# Patient Record
Sex: Female | Born: 1965
Health system: Southern US, Community
[De-identification: ages and names within clinical notes are randomized; demographics above are authoritative.]

## PROBLEM LIST (undated history)

## (undated) DIAGNOSIS — I10 Essential (primary) hypertension: Secondary | ICD-10-CM

## (undated) DIAGNOSIS — F419 Anxiety disorder, unspecified: Secondary | ICD-10-CM

## (undated) DIAGNOSIS — T7840XA Allergy, unspecified, initial encounter: Secondary | ICD-10-CM

## (undated) DIAGNOSIS — E669 Obesity, unspecified: Secondary | ICD-10-CM

## (undated) DIAGNOSIS — D649 Anemia, unspecified: Secondary | ICD-10-CM

## (undated) DIAGNOSIS — K219 Gastro-esophageal reflux disease without esophagitis: Secondary | ICD-10-CM

## (undated) HISTORY — PX: CARPAL TUNNEL RELEASE: SHX101

## (undated) HISTORY — DX: Allergy, unspecified, initial encounter: T78.40XA

## (undated) HISTORY — PX: ABDOMINAL HYSTERECTOMY: SHX81

## (undated) HISTORY — PX: BREAST REDUCTION SURGERY: SHX8

## (undated) HISTORY — DX: Gastro-esophageal reflux disease without esophagitis: K21.9

## (undated) HISTORY — PX: WISDOM TOOTH EXTRACTION: SHX21

## (undated) HISTORY — PX: LUMBAR MICRODISCECTOMY: SHX99

## (undated) HISTORY — DX: Anxiety disorder, unspecified: F41.9

## (undated) HISTORY — PX: LAPAROSCOPY: SHX197

## (undated) HISTORY — PX: TYMPANOSTOMY TUBE PLACEMENT: SHX32

## (undated) HISTORY — DX: Obesity, unspecified: E66.9

## (undated) HISTORY — DX: Anemia, unspecified: D64.9

## (undated) HISTORY — PX: CERVICAL DISCECTOMY: SHX98

---

## 1999-12-15 HISTORY — PX: REDUCTION MAMMAPLASTY: SUR839

## 2001-05-24 ENCOUNTER — Encounter (INDEPENDENT_AMBULATORY_CARE_PROVIDER_SITE_OTHER): Payer: Self-pay | Admitting: *Deleted

## 2001-05-24 ENCOUNTER — Observation Stay (HOSPITAL_COMMUNITY): Admission: RE | Admit: 2001-05-24 | Discharge: 2001-05-25 | Payer: Self-pay | Admitting: Obstetrics and Gynecology

## 2001-11-07 ENCOUNTER — Ambulatory Visit (HOSPITAL_COMMUNITY): Admission: RE | Admit: 2001-11-07 | Discharge: 2001-11-07 | Payer: Self-pay | Admitting: Family Medicine

## 2001-11-07 ENCOUNTER — Encounter: Payer: Self-pay | Admitting: Family Medicine

## 2003-06-15 ENCOUNTER — Other Ambulatory Visit: Admission: RE | Admit: 2003-06-15 | Discharge: 2003-06-15 | Payer: Self-pay | Admitting: Obstetrics and Gynecology

## 2003-09-25 ENCOUNTER — Encounter: Admission: RE | Admit: 2003-09-25 | Discharge: 2003-09-25 | Payer: Self-pay | Admitting: Family Medicine

## 2003-09-25 ENCOUNTER — Encounter: Payer: Self-pay | Admitting: Family Medicine

## 2003-10-29 ENCOUNTER — Ambulatory Visit (HOSPITAL_COMMUNITY): Admission: RE | Admit: 2003-10-29 | Discharge: 2003-10-29 | Payer: Self-pay | Admitting: Family Medicine

## 2004-06-17 ENCOUNTER — Other Ambulatory Visit: Admission: RE | Admit: 2004-06-17 | Discharge: 2004-06-17 | Payer: Self-pay | Admitting: Obstetrics and Gynecology

## 2005-07-27 ENCOUNTER — Other Ambulatory Visit: Admission: RE | Admit: 2005-07-27 | Discharge: 2005-07-27 | Payer: Self-pay | Admitting: Obstetrics and Gynecology

## 2006-04-04 ENCOUNTER — Emergency Department (HOSPITAL_COMMUNITY): Admission: EM | Admit: 2006-04-04 | Discharge: 2006-04-04 | Payer: Self-pay | Admitting: Family Medicine

## 2006-11-08 ENCOUNTER — Ambulatory Visit (HOSPITAL_BASED_OUTPATIENT_CLINIC_OR_DEPARTMENT_OTHER): Admission: RE | Admit: 2006-11-08 | Discharge: 2006-11-08 | Payer: Self-pay | Admitting: Psychiatry

## 2006-11-14 ENCOUNTER — Ambulatory Visit: Payer: Self-pay | Admitting: Internal Medicine

## 2008-02-13 ENCOUNTER — Emergency Department (HOSPITAL_COMMUNITY): Admission: EM | Admit: 2008-02-13 | Discharge: 2008-02-13 | Payer: Self-pay | Admitting: Family Medicine

## 2009-05-27 ENCOUNTER — Emergency Department (HOSPITAL_COMMUNITY): Admission: EM | Admit: 2009-05-27 | Discharge: 2009-05-27 | Payer: Self-pay | Admitting: Family Medicine

## 2009-10-02 ENCOUNTER — Encounter: Admission: RE | Admit: 2009-10-02 | Discharge: 2009-10-02 | Payer: Self-pay | Admitting: Family Medicine

## 2009-10-22 ENCOUNTER — Encounter: Admission: RE | Admit: 2009-10-22 | Discharge: 2009-10-22 | Payer: Self-pay | Admitting: Family Medicine

## 2009-11-01 ENCOUNTER — Ambulatory Visit (HOSPITAL_COMMUNITY): Admission: RE | Admit: 2009-11-01 | Discharge: 2009-11-01 | Payer: Self-pay | Admitting: Gastroenterology

## 2009-12-09 ENCOUNTER — Emergency Department (HOSPITAL_COMMUNITY): Admission: EM | Admit: 2009-12-09 | Discharge: 2009-12-09 | Payer: Self-pay | Admitting: Family Medicine

## 2010-12-26 ENCOUNTER — Ambulatory Visit (HOSPITAL_COMMUNITY)
Admission: RE | Admit: 2010-12-26 | Discharge: 2010-12-26 | Payer: Self-pay | Source: Home / Self Care | Attending: Family Medicine | Admitting: Family Medicine

## 2011-01-03 ENCOUNTER — Encounter: Payer: Self-pay | Admitting: Family Medicine

## 2011-05-01 NOTE — Discharge Summary (Signed)
[facility] [place]  Patient:    Theresa Henry, Theresa Henry                      MRN: [id number] Adm. Date:  [id number] Disc. Date: [id number] Attending:  Cordelia Pen Ii                           Discharge Summary  ADMITTING DIAGNOSES:          Endometriosis and menorrhagia.  DISCHARGE DIAGNOSES:          Endometriosis and menorrhagia.  PROCEDURE:                    Laparoscopically assisted vaginal hysterectomy with bilateral salpingo-oophorectomy.  REASON FOR ADMISSION:         This patient is a 45 year old married white female with known endometriosis who requests definitive management.  Details are dictated in the history and physical.  HOSPITAL COURSE:              The patient is admitted to the hospital, undergoes the above procedure without complication.  Estimated blood loss is less than 100 cc.  On the evening of surgery she has good pain relief.  Vital signs are stable with good urine output and she is afebrile.  On the day of discharge she is passing flatus, tolerating regular diet, ambulating and voiding well.  Vital signs are stable.  She is afebrile.  On the first postoperative day hemoglobin is 10.6, white count is 11.4.  CONDITION ON DISCHARGE:       Good.  DIET:                         Regular, as tolerated.  ACTIVITY:                     No lifting, no operation of automobiles, no vaginal entry.  DISCHARGE INSTRUCTIONS:       She is to call the office for problems including, but not limited to, temperature over 100 degrees, increasing pain, persistent nausea or vomiting, heavy vaginal bleeding.  DISCHARGE MEDICATIONS:        1. Tylox #30 one to two p.o. q.6h. p.r.n.                               2. Ibuprofen 600 mg p.o. q.6h. p.r.n.                               3. Colace one p.o. q.d. p.r.n.                               4. Estrogen patch Vivelle dot 0.1 mg every three                                  and a half days.  FOLLOW-UP:                     Return to office in two weeks. DD:  05/25/01 TD:  05/25/01 Job: [id number]

## 2011-05-01 NOTE — Procedures (Signed)
NAME:  Theresa Henry, Theresa Henry               ACCOUNT NO.:  [id number]   MEDICAL RECORD NO.:  [id number]          PATIENT TYPE:  OUT   LOCATION:  SLEEP CENTER                 FACILITY:  [facility]   PHYSICIAN:  Clinton D. Maple Hudson, MD, FCCP, FACPDATE OF BIRTH:  September 30[birth year] 1965   DATE OF STUDY:  11/08/2006                            NOCTURNAL POLYSOMNOGRAM   REFERRING PHYSICIAN:  Anne Fu, M.D.   INDICATIONS FOR STUDY:  Obstructive sleep apnea with insomnia,  nonrestorative sleep, daytime fatigue.   HOME MEDICATIONS:  Propranolol, Estratest, Cymbalta, Ambien CR,  Fioricet, Maxalt, Zanaflex.   A diagnostic NPSG protocol was requested.   SLEEP ARCHITECTURE:  Total sleep time 363 minutes with sleep efficiency  97%.  Stage I was 7%, stage II 79%, stages III and IV 4%, REM 10% of  total sleep time.  Sleep latency 11 minutes, REM latency 278 minutes,  awake after sleep onset 8 minutes, arousal index 14.2.  No bedtime  medication was taken.   RESPIRATORY DATA:  Apnea hypopnea index (AHI, RDI) 6.6 obstructive  events per hour indicating mild obstructive sleep apnea/hypopnea  syndrome.  This included 10 obstructive apneas and 30 hypopneas.  Events  were positional with most recorded while sleeping supine, although,  events were recorded also while sleeping on side.  REM AHI 5 per hour.  Diagnostic NPSG protocol was followed.   OXYGEN DATA:  Moderately loud snoring with oxygen desaturation to a  nadir of 90%.  Mean oxygen saturation through the study was 95% on room  air.   CARDIAC DATA:  Normal sinus rhythm.   MOVEMENT/PARASOMNIA:  Occasional limb jerks with little effect on sleep.   IMPRESSION:  1. Unremarkable sleep architecture for sleep center environment,      except that percentage of REM was somewhat reduced.  This can      reflect the influence of antidepressant therapy.  2. Mild obstructive sleep apnea/hypopnea syndrome, AHI 6.6 per hour      with positional events mostly recorded while  supine, moderate to      loud snoring at oxygen desaturation to a nadir of 90%.  3. Scores in this range are less than what is usually considered      appropriate for CPAP therapy.  Suggest      conservative intervention initially including encouragement to      sleep off flat of back, weight loss if appropriate, and treatment      for any significant nasal congestion if appropriate.      Clinton D. Maple Hudson, MD, [facility], FACP  Diplomate, Biomedical engineer of Sleep Medicine  Electronically Signed     CDY/MEDQ  D:  11/14/2006 14:17:33  T:  11/15/2006 11:29:04  Job:  [id number]

## 2011-05-01 NOTE — H&P (Signed)
[facility] [place]  Patient:    Theresa Henry, Theresa Henry                        MRN: [id number] Adm. Date:  05/24/01 Attending:  Guy Sandifer. Arleta Creek, M.D.                         History and Physical  CHIEF COMPLAINT:              Endometriosis with pelvic pain and heavy menstrual flows.  HISTORY OF PRESENT ILLNESS:   This patient is a 45 year old married white female with a documented history of endometriosis from several laparoscopies. She has increasing dysmenorrhea, and now has pain on a daily basis.  She also has increasingly heavy menstrual flows.  Ultrasound with sonohistogram in March 2000, was negative for any endometrial masses.  In addition, the patient has a family history of ovarian cancer in her mother.  After a careful discussion of the options, she requests surgical management. Laparoscopic-assisted vaginal hysterectomy with bilateral salpingo-oophorectomy was discussed.  Issues of menopause have also been discussed.  PAST MEDICAL HISTORY:         1. Endometriosis.                               2. Mononucleosis at age 67.  PAST SURGICAL HISTORY:        1. Laparoscopy at age 62, age 57, age 55, and                                  age 5.                               2. Hysteroscopy, D&C age 43.                               3. Adenoidectomy and tubes in ears at age 67.                               5. Wisdom teeth removal.  FAMILY HISTORY:               Positive for multiple gestation in maternal grandmother.  Diabetes in mother.  Ovarian cancer in mother.  Chronic hypertension in mother.  Emphysema in paternal grandfather.  MEDICATIONS:                  Darvocet-N 100 p.r.n.  ALLERGIES:                    AMOXICILLIN, leading to RASH ON HANDS. AUGMENTIN with RASH ON HANDS.  SEPTRA with UNKNOWN REACTION.  SOCIAL HISTORY:               The patient denies tobacco, alcohol, or drug abuse.  OBSTETRICAL HISTORY:          Vaginal delivery x  1.  REVIEW OF SYSTEMS:            Negative except as above.  PHYSICAL EXAMINATION:  VITAL SIGNS:                  Height 5 feet  7 inches, weight 184 pounds. Blood pressure 110/70.  HEENT:                        Without thyromegaly.  LUNGS:                        Clear to auscultation.  HEART:                        Regular rate and rhythm.  BACK:                         Without CVA tenderness.  BREASTS:                      Without mass, retraction, or discharge.  ABDOMEN:                      Soft and nontender, without masses.  PELVIC:                       Vulva, vagina, cervix without lesions.  Uterus anteverted, upper limits of normal in size and mobile.  There is diffuse adnexal tenderness, without masses.  EXTREMITIES:                  Grossly within normal limits.  NEUROLOGIC:                   Grossly within normal limits.  ASSESSMENT:                   1. Endometriosis with progressing pain.                               2. Menorrhagia.                               3. History of ovarian cancer in mother.  PLAN:                         Laparoscopic-assisted vaginal hysterectomy with bilateral salpingo-oophorectomy. DD:  05/11/01 TD:  05/11/01 Job: [id number]

## 2011-05-01 NOTE — Op Note (Signed)
[facility] [place]  Patient:    Theresa Henry, Theresa Henry                        MRN: [id number] Proc. Date: 05/24/01 Attending:  Guy Sandifer. Arleta Creek, M.D.                           Operative Report  PREOPERATIVE DIAGNOSES:       1. Endometriosis.                               2. Menorrhagia.  POSTOPERATIVE DIAGNOSES:      1. Endometriosis.                               2. Menorrhagia.  PROCEDURE:                    Laparoscopically assisted vaginal hysterectomy                               with bilateral salpingo-oophorectomy.  SURGEON:                      Guy Sandifer. Arleta Creek, M.D.  ASSISTANTFreddy Finner, M.D.  ANESTHESIA:                   General with endotracheal intubation.  ESTIMATED BLOOD LOSS:         Less than 100 cc.  INDICATIONS AND CONSENT:      This patient is a 45 year old married, white female with known endometriosis. She is status post multiple procedures in the past. She is also having heavier menstrual flows as discussed in the history and physical. In addition, her mother has a history of ovarian cancer. After careful discussion of the options, the patient is scheduled for laparoscopically assisted vaginal hysterectomy with bilateral salpingo-oophorectomy. Potential risks and complications have been discussed including, but not limited to, infection, bowel, bladder or ureteral injury, bleeding requiring transfusion of blood products with possible transfusion reaction, HIV and hepatitis acquisition, DVT, PE, pneumonia, fistula formation, menopausal changes, and postoperative dyspareunia. All questions have been answered and consent is signed on the chart.  FINDINGS:                     The appendix is normal. The uterus is upper limits of normal and smooth in contour. The anterior cul-de-sac is clean. Posterior cul-de-sac is clean. Left pelvic sidewall contains a single 1 cm dark black implant of endometriosis, 1 to  2 cm above the course of the ureter. Right pelvic sidewall is normal. Tubes and ovaries are normal.  DESCRIPTION OF PROCEDURE:     The patient is taken to the operating room, placed in the dorsal supine position where general anesthesia is induced via endotracheal intubation. She is then placed in the dorsal lithotomy position where she is prepped abdominally and vaginally. Bladder is straight catheterized and a Hulka tenaculum is placed in the uterus as a manipulator. She is draped in a sterile fashion. A small infraumbilical incision is made and a 10/11 disposable trocar sleeve is placed on the  first attempt without difficulty. Placement is verified with a laparoscope and no damage to the surrounding structures is noted. Pneumoperitoneum is induced and a small suprapubic and later left lower quadrant incisions are made after transillumination, and 5 mm nondisposable trocar sleeves are placed under direct visualization without difficulty. The above findings are noted. The implant of the left pelvic sidewall is cauterized with bipolar cautery. The course of the ureters is identified bilaterally and seemed to be well cleared of the area of the surgery of cautery. In using the 5 mm laparoscope through the left lower trocar sleeve, the LigaSure instrument is used to take down the infundibulopelvic ligaments followed by the round ligaments bilaterally. Attention is then returned to the operating laparoscope and inspection reveals good hemostasis. The vesicouterine peritoneum is incised in the midline and hydrodissected. It is then taken down cephalolaterally without difficulty. Minor bipolar cautery at the peritoneal edges is used to assure hemostasis. Excess fluid is removed and attention is turned to the vagina. Posterior cul-de-sac is entered sharply and the cervix is circumscribed with a scalpel. The uterosacral ligaments are taken bilaterally with the LigaSure instrument. The bladder  pillars are then taken bilaterally. The vaginal mucosa is advanced anteriorly, sharply and bluntly, and the anterior cul-de-sac is entered without difficulty. The cardinal ligaments followed by the uterine vessels are then taken bilaterally. Fundus is delivered posteriorly, and the proximal ligaments are clamped and taken down with the LigaSure. The pedicles and cuff at the 3 and 9 oclock position are then oversewn with a single Monocryl suture bilaterally. All suture will be 0 Monocryl unless other designated. Uterosacral ligaments are then plicated to the vagina bilaterally. The uterosacral ligaments are then plicated in the midline with a single stitch. Cuff is then closed with figure-of-eight sutures. Foley catheter is placed in the bladder and clear urine is noted. Attention is returned to the abdomen and careful inspection and irrigation reveals excellent hemostasis all around. Excess fluid is removed. The inferior trocar sleeves are removed, pneumoperitoneum is reduced, and no bleeding is noted from any site, including the cuff. Pneumoperitoneum is completely reduced and the umbilical trocar sleeve is removed. The umbilical incision is closed first with a 0 Vicryl in the deeper underlying layers with care being taken not to pick up any underlying structures. The skin is closed with subcuticular 3-0 Vicryl suture. The skin incisions are injected with 0.5% plain Marcaine. Dressings are applied. All counts are correct. The patient is awakened and taken to the recovery room in stable condition. DD:  05/24/01 TD:  05/24/01 Job: [id number]

## 2012-05-09 ENCOUNTER — Encounter (HOSPITAL_COMMUNITY): Payer: Self-pay

## 2012-05-09 ENCOUNTER — Emergency Department (HOSPITAL_COMMUNITY)
Admission: EM | Admit: 2012-05-09 | Discharge: 2012-05-09 | Disposition: A | Discharge status: Home / Self Care | Payer: Self-pay | Source: Home / Self Care | Attending: Family Medicine | Admitting: Family Medicine

## 2012-05-09 DIAGNOSIS — M539 Dorsopathy, unspecified: Secondary | ICD-10-CM

## 2012-05-09 DIAGNOSIS — M62830 Muscle spasm of back: Secondary | ICD-10-CM

## 2012-05-09 HISTORY — DX: Essential (primary) hypertension: I10

## 2012-05-09 MED ORDER — IBUPROFEN 600 MG PO TABS: 600.0000 mg | ORAL_TABLET | Freq: Three times a day (TID) | ORAL | Status: AC

## 2012-05-09 MED ORDER — CYCLOBENZAPRINE HCL 10 MG PO TABS: 10.0000 mg | ORAL_TABLET | Freq: Two times a day (BID) | ORAL | Status: AC | PRN

## 2012-05-09 MED ORDER — IBUPROFEN 600 MG PO TABS: 600.0000 mg | ORAL_TABLET | Freq: Three times a day (TID) | ORAL | Status: DC

## 2012-05-09 MED ORDER — TRAMADOL-ACETAMINOPHEN 37.5-325 MG PO TABS: 1.0000 | ORAL_TABLET | Freq: Three times a day (TID) | ORAL | Status: AC | PRN

## 2012-05-09 NOTE — ED Notes (Signed)
States she has had lower  Back pain since this AM when she woke; does not recall any causative mechanism

## 2012-05-09 NOTE — Discharge Instructions (Signed)
Take the prescribed medications as instructed. Be aware that Flexeril and tramadol can make you drowsy and he should not drive or operate machinery after taking this medication. Can take ibuprofen only intermittently time if you need to be alert. Take the other medications whether home. Start doing stretching exercises as soon as pain improves. Followup with your primary care provider if persistent symptoms despite following treatment or can also call orthopedic Dr. number provided above as needed for consult.

## 2012-05-14 NOTE — ED Provider Notes (Signed)
History     CSN: [id number]  Arrival date & time 05/09/12  1526   First MD Initiated Contact with Patient 05/09/12 1648      Chief Complaint  Patient presents with  . Back Pain    (Consider location/radiation/quality/duration/timing/severity/associated sxs/prior treatment) HPI Comments: 46 y/o female with h/o migraines and HTN here c/o bilateral low back pain since waking up today. Pain constant associated with spasms. Has had h/o low back pain in the past. Does not remember triggers. denies recent falls or trauma. Denies dysuria or hematuria. No fever or chills. No numbness, weakness or paresthesis in lower extremities. No incontinence. Able to walk with mild discomfort.    Past Medical History  Diagnosis Date  . Hypertension   . Migraine     Past Surgical History  Procedure Date  . Abdominal hysterectomy     History reviewed. No pertinent family history.  History  Substance Use Topics  . Smoking status: Not on file  . Smokeless tobacco: Not on file  . Alcohol Use:     OB History    Grav Para Term Preterm Abortions TAB SAB Ect Mult Living                  Review of Systems  Constitutional: Negative for fever, chills and appetite change.  HENT: Negative for neck pain.   Gastrointestinal: Negative for abdominal pain.  Genitourinary: Negative for dysuria, urgency, frequency, hematuria, flank pain, vaginal discharge and pelvic pain.  Musculoskeletal: Positive for back pain. Negative for joint swelling.  Skin: Negative for rash.  Neurological: Negative for dizziness, weakness, numbness and headaches.    Allergies  Review of patient's allergies indicates no known allergies.  Home Medications   Current Outpatient Rx  Name Route Sig Dispense Refill  . ASPIRIN 81 MG PO TABS Oral Take 81 mg by mouth daily.    Marland Kitchen BUTALBITAL-APAP-CAFFEINE 50-325-40 MG PO TABS Oral Take 1 tablet by mouth 2 (two) times daily as needed.    Marland Kitchen HYDROCHLOROTHIAZIDE 12.5 MG PO TABS Oral  Take 12.5 mg by mouth daily.    Marland Kitchen LISINOPRIL 10 MG PO TABS Oral Take 10 mg by mouth daily.    Marland Kitchen PROMETHAZINE HCL 25 MG PO TABS Oral Take 25 mg by mouth every 6 (six) hours as needed.    Marland Kitchen TEMAZEPAM 22.5 MG PO CAPS Oral Take 22.5 mg by mouth at bedtime as needed.    . CYCLOBENZAPRINE HCL 10 MG PO TABS Oral Take 1 tablet (10 mg total) by mouth 2 (two) times daily as needed for muscle spasms. 20 tablet 0  . IBUPROFEN 600 MG PO TABS Oral Take 1 tablet (600 mg total) by mouth 3 (three) times daily. 20 tablet 0  . TRAMADOL-ACETAMINOPHEN 37.5-325 MG PO TABS Oral Take 1 tablet by mouth every 8 (eight) hours as needed for pain. 20 tablet 0    BP 143/82  Pulse 90  Temp(Src) 98.2 F (36.8 C) (Oral)  Resp 18  SpO2 98%  Physical Exam  Nursing note and vitals reviewed. Constitutional: She is oriented to person, place, and time. She appears well-developed and well-nourished. No distress.  HENT:  Head: Normocephalic and atraumatic.  Neck: Normal range of motion. Neck supple.  Cardiovascular: Normal rate, regular rhythm and normal heart sounds.   Pulmonary/Chest: Breath sounds normal. She has no wheezes. She has no rales. She exhibits no tenderness.  Abdominal: Soft. She exhibits no mass. There is no tenderness.  Musculoskeletal:  Spine central. Fair back range of motion with mild reported discomfort more with anteflexion than extension. Tenderness to palpation and increased muscle tone in lumbar paravertebral muscles bilaterally. Negative bilateral straight leg test. Normal symmetric low extremity DTRs.    Neurological: She is alert and oriented to person, place, and time.  Skin: No rash noted.    ED Course  Procedures (including critical care time)  Labs Reviewed - No data to display No results found.   1. Spasm of back muscles       MDM  Treated symptomatically. Back stretching exercises hand out provided.        Sharin Grave, MD 05/14/12 2357

## 2014-01-05 ENCOUNTER — Encounter: Payer: Self-pay | Admitting: Podiatrist

## 2014-01-05 ENCOUNTER — Ambulatory Visit (INDEPENDENT_AMBULATORY_CARE_PROVIDER_SITE_OTHER): Payer: 59 | Admitting: Podiatrist

## 2014-01-05 VITALS — BP 139/95 | HR 86 | Resp 18

## 2014-01-05 DIAGNOSIS — B353 Tinea pedis: Secondary | ICD-10-CM

## 2014-01-05 MED ORDER — NAFTIFINE HCL 2 % EX CREA: TOPICAL_CREAM | CUTANEOUS | Status: DC

## 2014-01-05 MED ORDER — NAFTIFINE HCL 2 % EX CREA: 1.0000 "application " | TOPICAL_CREAM | CUTANEOUS | Status: DC

## 2014-01-05 NOTE — Progress Notes (Signed)
   Subjective:    Patient ID: Theresa Henry, female    DOB: September 11, 1966, 48 y.o.   MRN: [id number]  HPI I think I have some athlete's feet on the bottom of both my feet and they itch and tried otc products and I have had it for about 6 months and started on my right foot and now is on the left foot and peeling and cracking and also they are some some bumps that appear    Review of Systems  Constitutional: Negative.   Eyes: Negative.   Respiratory: Negative.   Cardiovascular: Negative.   Gastrointestinal: Negative.   Endocrine: Negative.   Genitourinary: Negative.   Musculoskeletal: Negative.   Skin: Negative.   Allergic/Immunologic: Negative.   Neurological: Positive for headaches.  Hematological: Negative.   Psychiatric/Behavioral: Negative.        Objective:   Physical Exam GENERAL APPEARANCE: Alert, conversant. Appropriately groomed. No acute distress.  VASCULAR: Pedal pulses palpable and strong bilateral.  Capillary refill time is immediate to all digits,  Proximal to distal cooling it warm to warm.  Digital hair growth is present bilateral  NEUROLOGIC: sensation is intact epicritically and protectively to 5.07 monofilament at 5/5 sites bilateral.  Light touch is intact bilateral, vibratory sensation intact bilateral, achilles tendon reflex is intact bilateral.  MUSCULOSKELETAL: acceptable muscle strength, tone and stability bilateral.  Intrinsic muscluature intact bilateral.  Rectus appearance of foot and digits noted bilateral.   DERMATOLOGIC: Scaly peeling skin present on the plantar lateral aspect of the right heel. Does appear to be a tinea infection with amoxicillin distribution as well. There were small brownish bumps present as well. And the patient states it started from it interdigital area between the fourth and fifth toes on the right foot. She also has interdigital tinea between the fourth and fifth toes on the left foot.     Assessment & Plan:  Tinea pedis Plan:  Recommended Naftin cream and a prescription was sent to her pharmacy. There is no improvement in 2 weeks she is instructed to call in week we'll consider Lamisil tablets for 21 day course.

## 2014-01-05 NOTE — Patient Instructions (Signed)
Athlete's Foot Athlete's foot (tinea pedis) is a fungal infection of the skin on the feet. It often occurs on the skin between the toes or underneath the toes. It can also occur on the soles of the feet. Athlete's foot is more likely to occur in hot, humid weather. Not washing your feet or changing your socks often enough can contribute to athlete's foot. The infection can spread from person to person (contagious). CAUSES Athlete's foot is caused by a fungus. This fungus thrives in warm, moist places. Most people get athlete's foot by sharing shower stalls, towels, and wet floors with an infected person. People with weakened immune systems, including those with diabetes, may be more likely to get athlete's foot. SYMPTOMS   Itchy areas between the toes or on the soles of the feet.  White, flaky, or scaly areas between the toes or on the soles of the feet.  Tiny, intensely itchy blisters between the toes or on the soles of the feet.  Tiny cuts on the skin. These cuts can develop a bacterial infection.  Thick or discolored toenails. DIAGNOSIS  Your caregiver can usually tell what the problem is by doing a physical exam. Your caregiver may also take a skin sample from the rash area. The skin sample may be examined under a microscope, or it may be tested to see if fungus will grow in the sample. A sample may also be taken from your toenail for testing. TREATMENT   prescription medicines can be used to kill the fungus. These medicines are available as powders or creams. Your caregiver can suggest medicines for you. Fungal infections respond slowly to treatment. You may need to continue using your medicine for several weeks. PREVENTION   Do not share towels.  Wear sandals in wet areas, such as shared locker rooms and shared showers.  Keep your feet dry. Wear shoes that allow air to circulate. Wear cotton or wool socks. HOME CARE INSTRUCTIONS   Take medicines as directed by your caregiver. Do not  use steroid creams on athlete's foot.  Keep your feet clean and cool. Wash your feet daily and dry them thoroughly, especially between your toes.  Change your socks every day. Wear cotton or wool socks. In hot climates, you may need to change your socks 2 to 3 times per day.  Wear sandals or canvas tennis shoes with good air circulation.  If you have blisters, soak your feet in Burow's solution or Epsom salts for 20 to 30 minutes, 2 times a day to dry out the blisters. Make sure you dry your feet thoroughly afterward. SEEK MEDICAL CARE IF:   You have a fever.  You have swelling, soreness, warmth, or redness in your foot.  You are not getting better after 7 days of treatment.  You are not completely cured after 30 days.  You have any problems caused by your medicines. MAKE SURE YOU:   Understand these instructions.  Will watch your condition.  Will get help right away if you are not doing well or get worse. Document Released: 11/27/2000 Document Revised: 02/22/2012 Document Reviewed: 09/18/2011 [facility] Patient Information 2014 [place], [place].

## 2014-01-25 ENCOUNTER — Telehealth: Payer: Self-pay | Admitting: *Deleted

## 2014-01-25 MED ORDER — TERBINAFINE HCL 250 MG PO TABS: 250.0000 mg | ORAL_TABLET | Freq: Every day | ORAL | Status: DC

## 2014-01-25 NOTE — Telephone Encounter (Signed)
Pt states the cream prescribed for her feet, has not helped.  Pt states she would like to try the oral medication Dr Valentina Lucks had mentioned.  Dr Valentina Lucks ordered Lamisil 250 mg #21 one tablet po q d no refills.  Orders electronically sent.

## 2014-02-15 ENCOUNTER — Telehealth: Payer: Self-pay | Admitting: *Deleted

## 2014-02-15 DIAGNOSIS — Z79899 Other long term (current) drug therapy: Secondary | ICD-10-CM

## 2014-02-15 MED ORDER — TERBINAFINE HCL 250 MG PO TABS: 250.0000 mg | ORAL_TABLET | Freq: Every day | ORAL | Status: DC

## 2014-02-15 NOTE — Telephone Encounter (Signed)
Yes, OK to refill the lamisil for 21 more days-  Will need to get a liver function panel and cbc w/ diff to make sure it isn't giving her any problems.  OK for her to pop by and pick up lab form and go ahead with the next 21 day course of lamisil.  I called in Lamisil now.

## 2014-02-15 NOTE — Telephone Encounter (Signed)
Pt states given Naftin cream and Lamisil, helped some asked if Dr Valentina Lucks thought another 3 weeks of the Lamisil may help.

## 2014-02-16 NOTE — Addendum Note (Signed)
Addended by: Harriett Sine D on: 02/16/2014 03:00 PM   Modules accepted: Orders

## 2014-02-16 NOTE — Telephone Encounter (Signed)
Dr Valentina Lucks states pt needs liver function test and CBC with Diff, but can go ahead with the 21 day course of Lamisil.  I left a message for pt to call for instructions.

## 2014-02-19 LAB — CBC WITH DIFFERENTIAL/PLATELET
BASOS ABS: 0 10*3/uL (ref 0.0–0.1)
BASOS PCT: 0 % (ref 0–1)
EOS ABS: 0.1 10*3/uL (ref 0.0–0.7)
Eosinophils Relative: 1 % (ref 0–5)
HCT: 36.6 % (ref 36.0–46.0)
HEMOGLOBIN: 11.9 g/dL — AB (ref 12.0–15.0)
Lymphocytes Relative: 36 % (ref 12–46)
Lymphs Abs: 2.6 10*3/uL (ref 0.7–4.0)
MCH: 25.7 pg — AB (ref 26.0–34.0)
MCHC: 32.5 g/dL (ref 30.0–36.0)
MCV: 79 fL (ref 78.0–100.0)
MONOS PCT: 7 % (ref 3–12)
Monocytes Absolute: 0.5 10*3/uL (ref 0.1–1.0)
NEUTROS ABS: 4 10*3/uL (ref 1.7–7.7)
NEUTROS PCT: 56 % (ref 43–77)
Platelets: 296 10*3/uL (ref 150–400)
RBC: 4.63 MIL/uL (ref 3.87–5.11)
RDW: 15.8 % — AB (ref 11.5–15.5)
WBC: 7.2 10*3/uL (ref 4.0–10.5)

## 2014-02-19 LAB — HEPATIC FUNCTION PANEL
ALBUMIN: 4.3 g/dL (ref 3.5–5.2)
ALK PHOS: 62 U/L (ref 39–117)
ALT: 14 U/L (ref 0–35)
AST: 14 U/L (ref 0–37)
Bilirubin, Direct: <0.1 mg/dL (ref 0.0–0.3)
TOTAL PROTEIN: 6.9 g/dL (ref 6.0–8.3)
Total Bilirubin: 0.3 mg/dL (ref 0.2–1.2)

## 2014-02-21 ENCOUNTER — Telehealth: Payer: Self-pay | Admitting: *Deleted

## 2014-02-21 NOTE — Telephone Encounter (Signed)
Message copied by Andres Ege on Wed Feb 21, 2014  8:56 AM ------      Message from: Bronson Ing      Created: Tue Feb 20, 2014  5:26 PM      Regarding: labs       Tell Rashel her labs look great-- ok to stay on lamisil longer for her feet      ----- Message -----         From: [facility]         Sent: 02/19/2014  11:24 PM           To: Trudie Buckler, DPM                   ------

## 2014-02-21 NOTE — Telephone Encounter (Signed)
Dr Shaune Pollack orders called to pt.

## 2014-10-10 ENCOUNTER — Other Ambulatory Visit (HOSPITAL_COMMUNITY): Payer: Self-pay | Admitting: Chiropractic Medicine

## 2014-10-10 DIAGNOSIS — M542 Cervicalgia: Secondary | ICD-10-CM

## 2014-10-17 ENCOUNTER — Ambulatory Visit (HOSPITAL_COMMUNITY)
Admission: RE | Admit: 2014-10-17 | Discharge: 2014-10-17 | Disposition: A | Discharge status: Home / Self Care | Payer: 59 | Source: Ambulatory Visit | Attending: Diagnostic Radiology | Admitting: Diagnostic Radiology

## 2014-10-17 DIAGNOSIS — M542 Cervicalgia: Secondary | ICD-10-CM

## 2014-10-17 DIAGNOSIS — M5022 Other cervical disc displacement, mid-cervical region: Secondary | ICD-10-CM | POA: Insufficient documentation

## 2014-11-17 ENCOUNTER — Encounter: Payer: Self-pay | Admitting: Dietician

## 2014-11-17 ENCOUNTER — Encounter: Payer: 59 | Attending: Family Medicine | Admitting: Dietician

## 2014-11-17 DIAGNOSIS — Z713 Dietary counseling and surveillance: Secondary | ICD-10-CM | POA: Diagnosis not present

## 2014-11-17 NOTE — Progress Notes (Signed)
Patient was seen on 11/17/2014 for the Weight Loss Class at the [facility]. The following learning objectives were met by the patient during this class:   Describe healthy choices in each food group  Describe portion size of foods  Use plate method for meal planning  Demonstrate how to read Nutrition Facts food label  Set realistic goals for weight loss, diet changes, and physical activity.   Goals:  1. Make healthy food choices in each food group.  2. Reduce portion size of foods.  3. Increase fruit and vegetable intake.  4. Use plate method for meal planning.  5. Increase physical activity.    Handouts given:   1. Nutrition Strategies for Weight Loss   2. Meal plan/portion card   3. MyPlate Planner   4. Weight Management Recipe Resources   5. Bake, Broil, Grill   

## 2014-12-20 ENCOUNTER — Ambulatory Visit: Payer: 59 | Admitting: Dietician

## 2015-03-22 DIAGNOSIS — L989 Disorder of the skin and subcutaneous tissue, unspecified: Secondary | ICD-10-CM | POA: Insufficient documentation

## 2015-05-18 ENCOUNTER — Encounter (HOSPITAL_COMMUNITY): Payer: Self-pay | Admitting: Emergency Medicine

## 2015-05-18 ENCOUNTER — Emergency Department (HOSPITAL_COMMUNITY)
Admission: EM | Admit: 2015-05-18 | Discharge: 2015-05-18 | Disposition: A | Discharge status: Home / Self Care | Payer: 59 | Attending: Emergency Medicine | Admitting: Emergency Medicine

## 2015-05-18 DIAGNOSIS — Z79899 Other long term (current) drug therapy: Secondary | ICD-10-CM | POA: Insufficient documentation

## 2015-05-18 DIAGNOSIS — G43909 Migraine, unspecified, not intractable, without status migrainosus: Secondary | ICD-10-CM | POA: Insufficient documentation

## 2015-05-18 DIAGNOSIS — Z880 Allergy status to penicillin: Secondary | ICD-10-CM | POA: Insufficient documentation

## 2015-05-18 DIAGNOSIS — E669 Obesity, unspecified: Secondary | ICD-10-CM | POA: Diagnosis not present

## 2015-05-18 DIAGNOSIS — Z7982 Long term (current) use of aspirin: Secondary | ICD-10-CM | POA: Diagnosis not present

## 2015-05-18 DIAGNOSIS — I10 Essential (primary) hypertension: Secondary | ICD-10-CM | POA: Diagnosis not present

## 2015-05-18 DIAGNOSIS — K219 Gastro-esophageal reflux disease without esophagitis: Secondary | ICD-10-CM | POA: Diagnosis not present

## 2015-05-18 MED ORDER — DEXAMETHASONE SODIUM PHOSPHATE 10 MG/ML IJ SOLN
10.0000 mg | Freq: Once | INTRAMUSCULAR | Status: AC
  Administered 2015-05-18: 10 mg via INTRAVENOUS
  Filled 2015-05-18: qty 1

## 2015-05-18 MED ORDER — KETOROLAC TROMETHAMINE 30 MG/ML IJ SOLN
30.0000 mg | Freq: Once | INTRAMUSCULAR | Status: AC
  Administered 2015-05-18: 30 mg via INTRAVENOUS
  Filled 2015-05-18: qty 1

## 2015-05-18 MED ORDER — PROCHLORPERAZINE EDISYLATE 5 MG/ML IJ SOLN
10.0000 mg | Freq: Four times a day (QID) | INTRAMUSCULAR | Status: DC | PRN
  Administered 2015-05-18: 10 mg via INTRAVENOUS
  Filled 2015-05-18 (×2): qty 2

## 2015-05-18 MED ORDER — METOCLOPRAMIDE HCL 5 MG/ML IJ SOLN
10.0000 mg | Freq: Once | INTRAMUSCULAR | Status: AC
  Administered 2015-05-18: 10 mg via INTRAVENOUS
  Filled 2015-05-18: qty 2

## 2015-05-18 NOTE — ED Notes (Signed)
Pt reports migraine since Tuesday. Tried home medications with no relief. Went to PCP yesterday who gave medications that took the edge off the pain, but did no completely relieve pain.

## 2015-05-18 NOTE — Discharge Instructions (Signed)

## 2015-05-18 NOTE — ED Provider Notes (Signed)
CSN: [id number]     Arrival date & time 05/18/15  1703 History   First MD Initiated Contact with Patient 05/18/15 1743     Chief Complaint  Patient presents with  . Migraine     (Consider location/radiation/quality/duration/timing/severity/associated sxs/prior Treatment) HPI Comments: Patient with a history of Migraine Headaches presents today with a chief complaint of Migraine.  She states that the Migraine has been present intermittently for the past 4 days.  She states that the pain feels similar to her typical migraine pain.  She attempted to take Baclofen and Fioricet at home for the pain, but it has not significantly helped.  She saw her PCP yesterday for the same and was given Phenergan, Toradol, and Demerol.  She states that initially the pain improved, but then returned this morning.  She states that she has had associated nausea and photophobia.  She denies fever, chills, neck pain/stiffness, vision changes, focal weakness, vomiting, numbness, or tingling.    Patient is a 49 y.o. female presenting with migraines. The history is provided by the patient.  Migraine    Past Medical History  Diagnosis Date  . Hypertension   . Migraine   . Allergy   . GERD (gastroesophageal reflux disease)   . Obesity    Past Surgical History  Procedure Laterality Date  . Abdominal hysterectomy     History reviewed. No pertinent family history. History  Substance Use Topics  . Smoking status: Never Smoker   . Smokeless tobacco: Never Used  . Alcohol Use: No   OB History    No data available     Review of Systems  All other systems reviewed and are negative.     Allergies  Diphenhydramine; Penicillins; and Sulfa antibiotics  Home Medications   Prior to Admission medications   Medication Sig Start Date End Date Taking? Authorizing Provider  aspirin 81 MG tablet Take 81 mg by mouth daily.    Historical Provider, MD  baclofen (LIORESAL) 10 MG tablet Take 10 mg by mouth 3 (three)  times daily.    Historical Provider, MD  butalbital-acetaminophen-caffeine (FIORICET, ESGIC) 50-325-40 MG per tablet Take 1 tablet by mouth 2 (two) times daily as needed.    Historical Provider, MD  esomeprazole (NEXIUM) 40 MG capsule Take 40 mg by mouth daily at 12 noon.    Historical Provider, MD  estradiol (VIVELLE-DOT) 0.025 MG/24HR Place 1 patch onto the skin 2 (two) times a week.    Historical Provider, MD  hydrochlorothiazide (HYDRODIURIL) 12.5 MG tablet Take 12.5 mg by mouth daily.    Historical Provider, MD  lisinopril (PRINIVIL,ZESTRIL) 10 MG tablet Take 10 mg by mouth daily.    Historical Provider, MD  Naftifine HCl (NAFTIN) 2 % CREA Apply cream to area once daily for 2 weeks. 01/05/14   Bronson Ing, DPM  promethazine (PHENERGAN) 25 MG tablet Take 25 mg by mouth every 6 (six) hours as needed.    Historical Provider, MD  temazepam (RESTORIL) 22.5 MG capsule Take 22.5 mg by mouth at bedtime as needed.    Historical Provider, MD  terbinafine (LAMISIL) 250 MG tablet Take 1 tablet (250 mg total) by mouth daily. 01/25/14   Bronson Ing, DPM  terbinafine (LAMISIL) 250 MG tablet Take 1 tablet (250 mg total) by mouth daily. 02/15/14   Viona Gilmore Egerton, DPM   BP 170/100 mmHg  Pulse 88  Temp(Src) 98.4 F (36.9 C) (Oral)  Resp 17  SpO2 96% Physical Exam  Constitutional: She appears  well-developed and well-nourished.  HENT:  Head: Normocephalic and atraumatic.  Mouth/Throat: Oropharynx is clear and moist.  Eyes: EOM are normal. Pupils are equal, round, and reactive to light.  Neck: Normal range of motion. Neck supple.  Cardiovascular: Normal rate, regular rhythm and normal heart sounds.   Pulmonary/Chest: Effort normal and breath sounds normal.  Musculoskeletal: Normal range of motion.  Neurological: She is alert. She has normal strength. No cranial nerve deficit or sensory deficit. Gait normal.  Normal finger to nose testing Normal rapid alternating movements Normal gait, no  ataxia  Skin: Skin is warm and dry.  Psychiatric: She has a normal mood and affect.  Nursing note and vitals reviewed.   ED Course  Procedures (including critical care time) Labs Review Labs Reviewed - No data to display  Imaging Review No results found.   EKG Interpretation None     7:00 PM Reassessed patient.  She reports that her pain and nausea have improved somewhat, but is still present.   8:00 PM Reassessed patient.  She reports that her pain has significantly improved at this time.  Patient is requesting to be discharged. MDM   Final diagnoses:  None   Pt HA treated and improved while in ED.  Presentation is like pts typical HA and non concerning for [facility], ICH, Meningitis, or temporal arteritis. Pt is afebrile with no focal neuro deficits, nuchal rigidity, or change in vision. Pt is to follow up with PCP to discuss prophylactic medication. Pt verbalizes understanding and is agreeable with plan to dc.  Patient stable for discharge.  Return precautions given.     Hyman Bible, PA-C 05/18/15 2108  Elnora Morrison, MD 05/20/15 [id number]

## 2015-08-23 DIAGNOSIS — D239 Other benign neoplasm of skin, unspecified: Secondary | ICD-10-CM | POA: Insufficient documentation

## 2015-12-17 MED FILL — HYDROCODONE-HOMATROPINE SYR: 5-1.5 | 4 days supply | Qty: 120 | Fill #0

## 2015-12-17 MED FILL — levoFLOXacin 500 MG TABS: 500 | 10 days supply | Qty: 10 | Fill #0

## 2015-12-23 MED FILL — OXYCODONE-APAP 10-325 TAB: 10-325 | 10 days supply | Qty: 30 | Fill #0

## 2015-12-26 DIAGNOSIS — K219 Gastro-esophageal reflux disease without esophagitis: Secondary | ICD-10-CM | POA: Diagnosis not present

## 2015-12-26 DIAGNOSIS — I10 Essential (primary) hypertension: Secondary | ICD-10-CM | POA: Diagnosis not present

## 2015-12-26 DIAGNOSIS — F419 Anxiety disorder, unspecified: Secondary | ICD-10-CM | POA: Diagnosis not present

## 2015-12-26 DIAGNOSIS — G43009 Migraine without aura, not intractable, without status migrainosus: Secondary | ICD-10-CM | POA: Diagnosis not present

## 2015-12-26 MED FILL — IRBESARTAN 300 MG TABLET: 300 | 30 days supply | Qty: 30 | Fill #0

## 2015-12-26 MED FILL — HYDROmorphone HCL 4 MG TABS: 4 | 1 days supply | Qty: 4 | Fill #0

## 2015-12-31 MED FILL — AMLODIPINE BESYLATE 5 MG TA: 5 | 30 days supply | Qty: 30 | Fill #0

## 2016-01-03 MED FILL — BACLOFEN 10 MG TABLET: 10 | 90 days supply | Qty: 270 | Fill #0

## 2016-01-03 MED FILL — PROMETHAZINE 25 MG TABLET: 25 | 22 days supply | Qty: 90 | Fill #1

## 2016-01-03 MED FILL — BUPROPION HCL XL 300 MG TAB: 300 | 90 days supply | Qty: 90 | Fill #1

## 2016-01-03 MED FILL — BUTALB-ACETAMIN-CAFF 50-325: 50-325-40 | 30 days supply | Qty: 60 | Fill #2

## 2016-01-09 DIAGNOSIS — H16291 Other keratoconjunctivitis, right eye: Secondary | ICD-10-CM | POA: Diagnosis not present

## 2016-01-09 MED FILL — TOBRAMYCIN-DEXAMETH OPTH SU: 0.3-0.1 | 8 days supply | Qty: 5 | Fill #0

## 2016-01-13 DIAGNOSIS — H16291 Other keratoconjunctivitis, right eye: Secondary | ICD-10-CM | POA: Diagnosis not present

## 2016-01-14 ENCOUNTER — Other Ambulatory Visit: Payer: Self-pay

## 2016-01-14 DIAGNOSIS — Z1231 Encounter for screening mammogram for malignant neoplasm of breast: Secondary | ICD-10-CM

## 2016-01-15 DIAGNOSIS — L989 Disorder of the skin and subcutaneous tissue, unspecified: Secondary | ICD-10-CM | POA: Diagnosis not present

## 2016-01-15 DIAGNOSIS — N390 Urinary tract infection, site not specified: Secondary | ICD-10-CM | POA: Diagnosis not present

## 2016-01-15 MED FILL — clonazePAM 1 MG TABS: 1 | 30 days supply | Qty: 60 | Fill #0

## 2016-01-15 MED FILL — CIPROFLOXACIN HCL 500 MG TA: 500 | 5 days supply | Qty: 10 | Fill #0

## 2016-01-16 DIAGNOSIS — M47812 Spondylosis without myelopathy or radiculopathy, cervical region: Secondary | ICD-10-CM | POA: Diagnosis not present

## 2016-01-16 DIAGNOSIS — M5412 Radiculopathy, cervical region: Secondary | ICD-10-CM | POA: Diagnosis not present

## 2016-01-16 DIAGNOSIS — M5090 Cervical disc disorder, unspecified, unspecified cervical region: Secondary | ICD-10-CM | POA: Diagnosis not present

## 2016-01-16 MED FILL — OXYCODONE-APAP 10-325 TAB: 10-325 | 20 days supply | Qty: 60 | Fill #0

## 2016-01-16 MED FILL — predniSONE 10 MG TABS: 10 | 6 days supply | Qty: 21 | Fill #0

## 2016-01-20 DIAGNOSIS — D225 Melanocytic nevi of trunk: Secondary | ICD-10-CM | POA: Diagnosis not present

## 2016-01-20 DIAGNOSIS — K219 Gastro-esophageal reflux disease without esophagitis: Secondary | ICD-10-CM | POA: Diagnosis not present

## 2016-01-20 MED FILL — ESOMEPRAZOLE MAG DR 40 MG C: 40 | 90 days supply | Qty: 90 | Fill #0

## 2016-01-24 DIAGNOSIS — G43009 Migraine without aura, not intractable, without status migrainosus: Secondary | ICD-10-CM | POA: Diagnosis not present

## 2016-01-24 MED FILL — HYDROmorphone HCL 4 MG TABS: 4 | 1 days supply | Qty: 4 | Fill #0

## 2016-01-29 DIAGNOSIS — M47812 Spondylosis without myelopathy or radiculopathy, cervical region: Secondary | ICD-10-CM | POA: Diagnosis not present

## 2016-01-30 MED FILL — AMLODIPINE BESYLATE 5 MG TA: 5 | 30 days supply | Qty: 30 | Fill #1

## 2016-01-30 MED FILL — MINIVELLE 0.075 MG PATCH: 0.075 | 28 days supply | Qty: 8 | Fill #4

## 2016-01-30 MED FILL — BUTALB-ACETAMIN-CAFF 50-325: 50-325-40 | 30 days supply | Qty: 60 | Fill #3

## 2016-01-30 MED FILL — IRBESARTAN 300 MG TABLET: 300 | 30 days supply | Qty: 30 | Fill #1

## 2016-02-10 DIAGNOSIS — R6889 Other general symptoms and signs: Secondary | ICD-10-CM | POA: Diagnosis not present

## 2016-02-10 DIAGNOSIS — G43009 Migraine without aura, not intractable, without status migrainosus: Secondary | ICD-10-CM | POA: Diagnosis not present

## 2016-02-11 MED FILL — PROMETHAZINE-DM SYRUP: 6.25-15 | 6 days supply | Qty: 120 | Fill #0

## 2016-02-14 MED FILL — HYDROmorphone HCL 4 MG TABS: 4 | 1 days supply | Qty: 4 | Fill #0

## 2016-02-14 MED FILL — clonazePAM 1 MG TABS: 1 | 30 days supply | Qty: 60 | Fill #1

## 2016-02-26 MED FILL — BUTALB-ACETAMIN-CAFF 50-325: 50-325-40 | 30 days supply | Qty: 60 | Fill #0

## 2016-02-27 MED FILL — CYCLOBENZAPRINE 10 MG TAB: 10 | 20 days supply | Qty: 60 | Fill #0

## 2016-03-03 DIAGNOSIS — N6081 Other benign mammary dysplasias of right breast: Secondary | ICD-10-CM | POA: Diagnosis not present

## 2016-03-03 DIAGNOSIS — L0291 Cutaneous abscess, unspecified: Secondary | ICD-10-CM | POA: Diagnosis not present

## 2016-03-03 MED FILL — DOXYCYCLINE HYCLATE 100 MG: 100 | 10 days supply | Qty: 20 | Fill #0

## 2016-03-09 MED FILL — OXYCODONE-APAP 10-325 TAB: 10-325 | 20 days supply | Qty: 60 | Fill #0

## 2016-03-10 MED FILL — TEMAZEPAM 30 MG CAPSULE: 30 | 90 days supply | Qty: 90 | Fill #0

## 2016-03-17 MED FILL — clonazePAM 1 MG TABS: 1 | 30 days supply | Qty: 60 | Fill #2

## 2016-03-27 MED FILL — BACLOFEN 10 MG TABLET: 10 | 90 days supply | Qty: 270 | Fill #1

## 2016-03-27 MED FILL — BUTALB-ACETAMIN-CAFF 50-325: 50-325-40 | 30 days supply | Qty: 60 | Fill #1

## 2016-03-30 ENCOUNTER — Other Ambulatory Visit: Payer: Self-pay | Admitting: Nurse Practitioner

## 2016-03-30 ENCOUNTER — Other Ambulatory Visit: Payer: Self-pay | Admitting: Obstetrics and Gynecology

## 2016-03-30 ENCOUNTER — Ambulatory Visit
Admission: RE | Admit: 2016-03-30 | Discharge: 2016-03-30 | Disposition: A | Discharge status: Home / Self Care | Payer: 59 | Source: Ambulatory Visit

## 2016-03-30 DIAGNOSIS — Z6834 Body mass index (BMI) 34.0-34.9, adult: Secondary | ICD-10-CM | POA: Diagnosis not present

## 2016-03-30 DIAGNOSIS — N6313 Unspecified lump in the right breast, lower outer quadrant: Secondary | ICD-10-CM

## 2016-03-30 DIAGNOSIS — N951 Menopausal and female climacteric states: Secondary | ICD-10-CM | POA: Diagnosis not present

## 2016-03-30 DIAGNOSIS — Z1231 Encounter for screening mammogram for malignant neoplasm of breast: Secondary | ICD-10-CM

## 2016-03-30 DIAGNOSIS — Z01419 Encounter for gynecological examination (general) (routine) without abnormal findings: Secondary | ICD-10-CM | POA: Diagnosis not present

## 2016-03-30 MED FILL — MINIVELLE 0.075 MG PATCH: 0.075 | 28 days supply | Qty: 8 | Fill #0

## 2016-03-31 ENCOUNTER — Ambulatory Visit
Admission: RE | Admit: 2016-03-31 | Discharge: 2016-03-31 | Disposition: A | Discharge status: Home / Self Care | Payer: 59 | Source: Ambulatory Visit | Attending: Obstetrics and Gynecology | Admitting: Obstetrics and Gynecology

## 2016-03-31 ENCOUNTER — Ambulatory Visit
Admission: RE | Admit: 2016-03-31 | Discharge: 2016-03-31 | Disposition: A | Discharge status: Home / Self Care | Payer: 59 | Source: Ambulatory Visit | Attending: Nurse Practitioner | Admitting: Nurse Practitioner

## 2016-03-31 DIAGNOSIS — N6313 Unspecified lump in the right breast, lower outer quadrant: Secondary | ICD-10-CM

## 2016-03-31 DIAGNOSIS — R928 Other abnormal and inconclusive findings on diagnostic imaging of breast: Secondary | ICD-10-CM | POA: Diagnosis not present

## 2016-03-31 DIAGNOSIS — N6489 Other specified disorders of breast: Secondary | ICD-10-CM | POA: Diagnosis not present

## 2016-04-01 ENCOUNTER — Other Ambulatory Visit: Payer: 59

## 2016-04-03 DIAGNOSIS — G5602 Carpal tunnel syndrome, left upper limb: Secondary | ICD-10-CM | POA: Diagnosis not present

## 2016-04-03 DIAGNOSIS — G5601 Carpal tunnel syndrome, right upper limb: Secondary | ICD-10-CM | POA: Diagnosis not present

## 2016-04-03 MED FILL — predniSONE 10 MG (48) TBPK: 10 | 12 days supply | Qty: 48 | Fill #0

## 2016-04-08 MED FILL — PHENADOZ 25 MG SUPP: 25 | 1 days supply | Qty: 6 | Fill #0

## 2016-04-08 MED FILL — IRBESARTAN 300 MG TABLET: 300 | 30 days supply | Qty: 30 | Fill #2

## 2016-04-08 MED FILL — BUPROPION HCL XL 300 MG TAB: 300 | 90 days supply | Qty: 90 | Fill #2

## 2016-04-08 MED FILL — AMLODIPINE BESYLATE 5 MG TA: 5 | 30 days supply | Qty: 30 | Fill #2

## 2016-04-08 MED FILL — PROMETHAZINE 25 MG TABLET: 25 | 22 days supply | Qty: 90 | Fill #0

## 2016-04-14 MED FILL — OXYCODONE-APAP 10-325 TAB: 10-325 | 20 days supply | Qty: 60 | Fill #0

## 2016-04-15 DIAGNOSIS — N63 Unspecified lump in breast: Secondary | ICD-10-CM | POA: Diagnosis not present

## 2016-04-16 DIAGNOSIS — G43009 Migraine without aura, not intractable, without status migrainosus: Secondary | ICD-10-CM | POA: Diagnosis not present

## 2016-04-16 MED FILL — HYDROmorphone HCL 4 MG TABS: 4 | 1 days supply | Qty: 4 | Fill #0

## 2016-04-18 DIAGNOSIS — R51 Headache: Secondary | ICD-10-CM | POA: Diagnosis not present

## 2016-04-18 DIAGNOSIS — Z79899 Other long term (current) drug therapy: Secondary | ICD-10-CM | POA: Diagnosis not present

## 2016-04-18 DIAGNOSIS — R030 Elevated blood-pressure reading, without diagnosis of hypertension: Secondary | ICD-10-CM | POA: Diagnosis not present

## 2016-04-20 MED FILL — clonazePAM 1 MG TABS: 1 | 30 days supply | Qty: 60 | Fill #3

## 2016-04-23 DIAGNOSIS — M542 Cervicalgia: Secondary | ICD-10-CM | POA: Diagnosis not present

## 2016-04-23 DIAGNOSIS — M47812 Spondylosis without myelopathy or radiculopathy, cervical region: Secondary | ICD-10-CM | POA: Diagnosis not present

## 2016-04-23 MED FILL — ESOMEPRAZOLE MAG DR 40 MG C: 40 | 90 days supply | Qty: 90 | Fill #1

## 2016-04-27 MED FILL — BUTALB-ACETAMIN-CAFF 50-325: 50-325-40 | 30 days supply | Qty: 60 | Fill #2

## 2016-04-28 DIAGNOSIS — M5030 Other cervical disc degeneration, unspecified cervical region: Secondary | ICD-10-CM | POA: Diagnosis not present

## 2016-04-28 DIAGNOSIS — M2550 Pain in unspecified joint: Secondary | ICD-10-CM | POA: Diagnosis not present

## 2016-04-29 DIAGNOSIS — M546 Pain in thoracic spine: Secondary | ICD-10-CM | POA: Diagnosis not present

## 2016-04-29 DIAGNOSIS — M9902 Segmental and somatic dysfunction of thoracic region: Secondary | ICD-10-CM | POA: Diagnosis not present

## 2016-04-29 DIAGNOSIS — M542 Cervicalgia: Secondary | ICD-10-CM | POA: Diagnosis not present

## 2016-04-29 DIAGNOSIS — M9901 Segmental and somatic dysfunction of cervical region: Secondary | ICD-10-CM | POA: Diagnosis not present

## 2016-05-04 DIAGNOSIS — M546 Pain in thoracic spine: Secondary | ICD-10-CM | POA: Diagnosis not present

## 2016-05-04 DIAGNOSIS — M9902 Segmental and somatic dysfunction of thoracic region: Secondary | ICD-10-CM | POA: Diagnosis not present

## 2016-05-04 DIAGNOSIS — M9901 Segmental and somatic dysfunction of cervical region: Secondary | ICD-10-CM | POA: Diagnosis not present

## 2016-05-04 DIAGNOSIS — M542 Cervicalgia: Secondary | ICD-10-CM | POA: Diagnosis not present

## 2016-05-08 MED FILL — CYCLOBENZAPRINE 10 MG TAB: 10 | 20 days supply | Qty: 60 | Fill #0

## 2016-05-12 DIAGNOSIS — M47812 Spondylosis without myelopathy or radiculopathy, cervical region: Secondary | ICD-10-CM | POA: Diagnosis not present

## 2016-05-20 DIAGNOSIS — H524 Presbyopia: Secondary | ICD-10-CM | POA: Diagnosis not present

## 2016-05-21 DIAGNOSIS — M542 Cervicalgia: Secondary | ICD-10-CM | POA: Diagnosis not present

## 2016-05-21 DIAGNOSIS — G43009 Migraine without aura, not intractable, without status migrainosus: Secondary | ICD-10-CM | POA: Diagnosis not present

## 2016-05-21 DIAGNOSIS — I10 Essential (primary) hypertension: Secondary | ICD-10-CM | POA: Diagnosis not present

## 2016-05-21 DIAGNOSIS — K219 Gastro-esophageal reflux disease without esophagitis: Secondary | ICD-10-CM | POA: Diagnosis not present

## 2016-05-21 DIAGNOSIS — G4700 Insomnia, unspecified: Secondary | ICD-10-CM | POA: Diagnosis not present

## 2016-05-28 MED FILL — BUTALB-ACETAMIN-CAFF 50-325: 50-325-40 | 30 days supply | Qty: 60 | Fill #3

## 2016-06-05 DIAGNOSIS — M542 Cervicalgia: Secondary | ICD-10-CM | POA: Diagnosis not present

## 2016-06-05 DIAGNOSIS — M47812 Spondylosis without myelopathy or radiculopathy, cervical region: Secondary | ICD-10-CM | POA: Diagnosis not present

## 2016-06-05 MED FILL — CYCLOBENZAPRINE 10 MG TAB: 10 | 20 days supply | Qty: 60 | Fill #0

## 2016-06-06 DIAGNOSIS — R51 Headache: Secondary | ICD-10-CM | POA: Diagnosis not present

## 2016-06-09 MED FILL — MINIVELLE 0.075 MG PATCH: 0.075 | 28 days supply | Qty: 8 | Fill #1

## 2016-06-09 MED FILL — AMLODIPINE BESYLATE 5 MG TA: 5 | 30 days supply | Qty: 30 | Fill #3

## 2016-06-09 MED FILL — TEMAZEPAM 30 MG CAPSULE: 30 | 90 days supply | Qty: 90 | Fill #1

## 2016-06-09 MED FILL — IRBESARTAN 300 MG TABLET: 300 | 30 days supply | Qty: 30 | Fill #3

## 2016-06-09 MED FILL — clonazePAM 1 MG TABS: 1 | 30 days supply | Qty: 60 | Fill #4

## 2016-06-10 MED FILL — OXYCODONE-ACETAMINOPHEN 10-: 10-325 | 20 days supply | Qty: 60 | Fill #0

## 2016-06-15 DIAGNOSIS — M47812 Spondylosis without myelopathy or radiculopathy, cervical region: Secondary | ICD-10-CM | POA: Diagnosis not present

## 2016-06-15 DIAGNOSIS — R51 Headache: Secondary | ICD-10-CM | POA: Diagnosis not present

## 2016-06-15 DIAGNOSIS — M5090 Cervical disc disorder, unspecified, unspecified cervical region: Secondary | ICD-10-CM | POA: Diagnosis not present

## 2016-06-24 MED FILL — BUTALB-ACETAMIN-CAFF 50-325: 50-325-40 | 7 days supply | Qty: 60 | Fill #0

## 2016-07-01 MED FILL — BACLOFEN 10 MG TABLET: 10 | 90 days supply | Qty: 270 | Fill #0

## 2016-07-02 DIAGNOSIS — G43009 Migraine without aura, not intractable, without status migrainosus: Secondary | ICD-10-CM | POA: Diagnosis not present

## 2016-07-02 MED FILL — HYDROmorphone HCL 4 MG TABS: 4 | 1 days supply | Qty: 4 | Fill #0

## 2016-07-03 DIAGNOSIS — G43009 Migraine without aura, not intractable, without status migrainosus: Secondary | ICD-10-CM | POA: Diagnosis not present

## 2016-07-03 MED FILL — HYDROmorphone HCL 4 MG TABS: 4 | 1 days supply | Qty: 4 | Fill #0

## 2016-07-03 MED FILL — PROMETHAZINE 25 MG TABLET: 25 | 22 days supply | Qty: 90 | Fill #0

## 2016-07-17 MED FILL — OXYCODONE-APAP 10-325 TAB: 10-325 | 20 days supply | Qty: 60 | Fill #0

## 2016-07-17 MED FILL — CYCLOBENZAPRINE 10 MG TAB: 10 | 20 days supply | Qty: 60 | Fill #0

## 2016-07-27 MED FILL — BUTALB-ACETAMIN-CAFF 50-325: 50-325-40 | 7 days supply | Qty: 60 | Fill #1

## 2016-07-27 MED FILL — ESOMEPRAZOLE MAG DR 40 MG C: 40 | 90 days supply | Qty: 90 | Fill #2

## 2016-07-30 MED FILL — clonazePAM 1 MG TABS: 1 | 30 days supply | Qty: 60 | Fill #0

## 2016-08-05 ENCOUNTER — Ambulatory Visit (HOSPITAL_COMMUNITY)
Admission: RE | Admit: 2016-08-05 | Discharge: 2016-08-05 | Disposition: A | Discharge status: Home / Self Care | Payer: 59 | Source: Ambulatory Visit | Attending: Family Medicine | Admitting: Family Medicine

## 2016-08-05 ENCOUNTER — Other Ambulatory Visit (HOSPITAL_COMMUNITY): Payer: Self-pay | Admitting: Family Medicine

## 2016-08-05 DIAGNOSIS — I10 Essential (primary) hypertension: Secondary | ICD-10-CM | POA: Diagnosis not present

## 2016-08-05 DIAGNOSIS — R0602 Shortness of breath: Secondary | ICD-10-CM | POA: Insufficient documentation

## 2016-08-05 DIAGNOSIS — R600 Localized edema: Secondary | ICD-10-CM | POA: Insufficient documentation

## 2016-08-05 MED FILL — FUROSEMIDE 20 MG TABLET: 20 | 30 days supply | Qty: 30 | Fill #0

## 2016-08-18 MED FILL — CYCLOBENZAPRINE 10 MG TAB: 10 | 20 days supply | Qty: 60 | Fill #0

## 2016-08-18 MED FILL — OXYCODONE-ACETAMINOPHEN 10-: 10-325 | 20 days supply | Qty: 60 | Fill #0

## 2016-08-21 DIAGNOSIS — M542 Cervicalgia: Secondary | ICD-10-CM | POA: Diagnosis not present

## 2016-08-21 DIAGNOSIS — R51 Headache: Secondary | ICD-10-CM | POA: Diagnosis not present

## 2016-08-21 DIAGNOSIS — M5090 Cervical disc disorder, unspecified, unspecified cervical region: Secondary | ICD-10-CM | POA: Diagnosis not present

## 2016-08-21 DIAGNOSIS — M47812 Spondylosis without myelopathy or radiculopathy, cervical region: Secondary | ICD-10-CM | POA: Diagnosis not present

## 2016-08-27 MED FILL — MINIVELLE 0.075 MG PATCH: 0.075 | 28 days supply | Qty: 8 | Fill #2

## 2016-08-27 MED FILL — BUTALB-ACETAMIN-CAFF 50-325: 50-325-40 | 7 days supply | Qty: 60 | Fill #2

## 2016-09-03 MED FILL — predniSONE 10 MG (48) TBPK: 10 | 12 days supply | Qty: 48 | Fill #0

## 2016-09-08 MED FILL — clonazePAM 1 MG TABS: 1 | 30 days supply | Qty: 60 | Fill #1

## 2016-09-08 MED FILL — FUROSEMIDE 20 MG TABLET: 20 | 30 days supply | Qty: 30 | Fill #1

## 2016-09-08 MED FILL — AMLODIPINE BESYLATE 5 MG TA: 5 | 30 days supply | Qty: 30 | Fill #4

## 2016-09-08 MED FILL — IRBESARTAN 300 MG TABLET: 300 | 30 days supply | Qty: 30 | Fill #4

## 2016-09-14 DIAGNOSIS — G4700 Insomnia, unspecified: Secondary | ICD-10-CM | POA: Diagnosis not present

## 2016-09-14 DIAGNOSIS — G43009 Migraine without aura, not intractable, without status migrainosus: Secondary | ICD-10-CM | POA: Diagnosis not present

## 2016-09-14 MED FILL — HYDROmorphone HCL 4 MG TABS: 4 | 1 days supply | Qty: 4 | Fill #0

## 2016-09-14 MED FILL — TEMAZEPAM 30 MG CAPSULE: 30 | 90 days supply | Qty: 90 | Fill #0

## 2016-09-15 DIAGNOSIS — R51 Headache: Secondary | ICD-10-CM | POA: Diagnosis not present

## 2016-09-15 DIAGNOSIS — M5481 Occipital neuralgia: Secondary | ICD-10-CM | POA: Diagnosis not present

## 2016-09-15 DIAGNOSIS — M542 Cervicalgia: Secondary | ICD-10-CM | POA: Diagnosis not present

## 2016-09-17 MED FILL — CYCLOBENZAPRINE 10 MG TAB: 10 | 20 days supply | Qty: 60 | Fill #0

## 2016-09-17 MED FILL — OXYCODONE-ACETAMINOPHEN 10-: 10-325 | 20 days supply | Qty: 60 | Fill #0

## 2016-09-28 MED FILL — BUTALB-ACETAMIN-CAFF 50-325: 50-325-40 | 7 days supply | Qty: 60 | Fill #3

## 2016-09-28 MED FILL — BACLOFEN 10 MG TABLET: 10 | 90 days supply | Qty: 270 | Fill #1

## 2016-09-29 DIAGNOSIS — M5412 Radiculopathy, cervical region: Secondary | ICD-10-CM | POA: Diagnosis not present

## 2016-09-29 DIAGNOSIS — M47812 Spondylosis without myelopathy or radiculopathy, cervical region: Secondary | ICD-10-CM | POA: Diagnosis not present

## 2016-09-29 MED FILL — PROMETHAZINE 25 MG TABLET: 25 | 22 days supply | Qty: 90 | Fill #0

## 2016-10-08 ENCOUNTER — Encounter: Payer: Self-pay | Admitting: Allergy and Immunology

## 2016-10-08 ENCOUNTER — Ambulatory Visit (INDEPENDENT_AMBULATORY_CARE_PROVIDER_SITE_OTHER): Payer: 59 | Admitting: Allergy and Immunology

## 2016-10-08 VITALS — BP 172/120 | HR 88 | Temp 98.6°F | Resp 20 | Ht 66.14 in | Wt 224.6 lb

## 2016-10-08 DIAGNOSIS — G43909 Migraine, unspecified, not intractable, without status migrainosus: Secondary | ICD-10-CM

## 2016-10-08 DIAGNOSIS — J3089 Other allergic rhinitis: Secondary | ICD-10-CM
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
## 2016-10-08 NOTE — Patient Instructions (Addendum)
  1. Allergen avoidance measures?  2. Slowly taper off all forms of caffeine. Aim for the least amount possible. No caffeine would be best.  3. If still with headaches then will need MRI of brain and sleep study to investigate for sleep apnea  4. Can use OTC antihistamine for allergies if needed  5. Contact clinic in 4 weeks with update

## 2016-10-09 MED FILL — clonazePAM 1 MG TABS: 1 | 30 days supply | Qty: 60 | Fill #2

## 2016-10-15 MED FILL — predniSONE 10 MG (21) TBPK: 10 | 6 days supply | Qty: 21 | Fill #0

## 2016-10-16 MED FILL — CYCLOBENZAPRINE 10 MG TAB: 10 | 20 days supply | Qty: 60 | Fill #0

## 2016-10-16 MED FILL — OXYCODONE-APAP 10-325: 10-325 | 20 days supply | Qty: 60 | Fill #0

## 2016-10-22 ENCOUNTER — Other Ambulatory Visit (HOSPITAL_COMMUNITY): Payer: Self-pay | Admitting: Family Medicine

## 2016-10-22 DIAGNOSIS — M5412 Radiculopathy, cervical region: Secondary | ICD-10-CM

## 2016-10-22 DIAGNOSIS — G43009 Migraine without aura, not intractable, without status migrainosus: Secondary | ICD-10-CM | POA: Diagnosis not present

## 2016-10-28 ENCOUNTER — Ambulatory Visit (HOSPITAL_COMMUNITY)
Admission: RE | Admit: 2016-10-28 | Discharge: 2016-10-28 | Disposition: A | Discharge status: Home / Self Care | Payer: 59 | Source: Ambulatory Visit | Attending: Family Medicine | Admitting: Family Medicine

## 2016-10-28 DIAGNOSIS — M2578 Osteophyte, vertebrae: Secondary | ICD-10-CM | POA: Diagnosis not present

## 2016-10-28 DIAGNOSIS — M50221 Other cervical disc displacement at C4-C5 level: Secondary | ICD-10-CM | POA: Insufficient documentation

## 2016-10-28 DIAGNOSIS — M4802 Spinal stenosis, cervical region: Secondary | ICD-10-CM | POA: Diagnosis not present

## 2016-10-28 DIAGNOSIS — M5412 Radiculopathy, cervical region: Secondary | ICD-10-CM

## 2016-10-28 DIAGNOSIS — M47812 Spondylosis without myelopathy or radiculopathy, cervical region: Secondary | ICD-10-CM | POA: Diagnosis not present

## 2016-10-28 DIAGNOSIS — M50222 Other cervical disc displacement at C5-C6 level: Secondary | ICD-10-CM | POA: Diagnosis not present

## 2016-10-29 MED FILL — BUTALB-ACETAMIN-CAFF 50-325: 50-325-40 | 7 days supply | Qty: 60 | Fill #0

## 2016-11-02 MED FILL — predniSONE 10 MG TABS: 10 | 12 days supply | Qty: 30 | Fill #0

## 2016-11-04 MED FILL — ESOMEPRAZOLE MAG DR 40 MG C: 40 | 90 days supply | Qty: 90 | Fill #3

## 2016-11-11 MED FILL — clonazePAM 1 MG TABS: 1 | 30 days supply | Qty: 60 | Fill #3

## 2016-11-13 MED FILL — OXYCODONE-APAP 10-325 TAB: 10-325 | 20 days supply | Qty: 60 | Fill #0

## 2016-11-13 MED FILL — CYCLOBENZAPRINE 10 MG TAB: 10 | 20 days supply | Qty: 60 | Fill #0

## 2016-11-17 DIAGNOSIS — M542 Cervicalgia: Secondary | ICD-10-CM | POA: Diagnosis not present

## 2016-11-20 DIAGNOSIS — K219 Gastro-esophageal reflux disease without esophagitis: Secondary | ICD-10-CM | POA: Diagnosis not present

## 2016-11-20 DIAGNOSIS — I10 Essential (primary) hypertension: Secondary | ICD-10-CM | POA: Diagnosis not present

## 2016-11-20 DIAGNOSIS — F419 Anxiety disorder, unspecified: Secondary | ICD-10-CM | POA: Diagnosis not present

## 2016-11-20 DIAGNOSIS — G43009 Migraine without aura, not intractable, without status migrainosus: Secondary | ICD-10-CM | POA: Diagnosis not present

## 2016-11-20 DIAGNOSIS — M5412 Radiculopathy, cervical region: Secondary | ICD-10-CM | POA: Diagnosis not present

## 2016-11-20 DIAGNOSIS — G4700 Insomnia, unspecified: Secondary | ICD-10-CM | POA: Diagnosis not present

## 2016-11-27 MED FILL — BUTALB-ACETAMIN-CAFF 50-325: 50-325-40 | 7 days supply | Qty: 60 | Fill #1

## 2016-12-11 MED FILL — AMLODIPINE BESYLATE 5 MG TA: 5 | 30 days supply | Qty: 30 | Fill #5

## 2016-12-11 MED FILL — IRBESARTAN 300 MG TABLET: 300 | 30 days supply | Qty: 30 | Fill #5

## 2016-12-11 MED FILL — OXYCODONE-APAP 10-325: 10-325 | 20 days supply | Qty: 60 | Fill #0

## 2016-12-11 MED FILL — CYCLOBENZAPRINE 10 MG TAB: 10 | 20 days supply | Qty: 60 | Fill #0

## 2016-12-11 MED FILL — clonazePAM 1 MG TABS: 1 | 30 days supply | Qty: 60 | Fill #4

## 2016-12-12 DIAGNOSIS — M47812 Spondylosis without myelopathy or radiculopathy, cervical region: Secondary | ICD-10-CM | POA: Diagnosis not present

## 2016-12-12 DIAGNOSIS — M5412 Radiculopathy, cervical region: Secondary | ICD-10-CM | POA: Diagnosis not present

## 2016-12-12 DIAGNOSIS — M50222 Other cervical disc displacement at C5-C6 level: Secondary | ICD-10-CM | POA: Diagnosis not present

## 2016-12-12 DIAGNOSIS — M50223 Other cervical disc displacement at C6-C7 level: Secondary | ICD-10-CM | POA: Diagnosis not present

## 2016-12-15 DIAGNOSIS — G43009 Migraine without aura, not intractable, without status migrainosus: Secondary | ICD-10-CM | POA: Diagnosis not present

## 2016-12-15 MED FILL — HYDROmorphone HCL 4 MG TABS: 4 | 1 days supply | Qty: 4 | Fill #0

## 2016-12-15 MED FILL — TEMAZEPAM 30 MG CAPSULE: 30 | 90 days supply | Qty: 90 | Fill #0

## 2016-12-29 DIAGNOSIS — M542 Cervicalgia: Secondary | ICD-10-CM | POA: Diagnosis not present

## 2016-12-29 MED FILL — BUTALB-ACETAMIN-CAFF 50-325: 50-325-40 | 7 days supply | Qty: 60 | Fill #2

## 2016-12-29 MED FILL — BACLOFEN 10 MG TABLET: 10 | 90 days supply | Qty: 270 | Fill #0

## 2016-12-29 MED FILL — PROMETHAZINE 25 MG TABLET: 25 | 22 days supply | Qty: 90 | Fill #0

## 2017-01-02 DIAGNOSIS — N390 Urinary tract infection, site not specified: Secondary | ICD-10-CM | POA: Diagnosis not present

## 2017-01-04 MED FILL — METHYLPREDNISOLONE 4 MG TAB: 4 | 6 days supply | Qty: 21 | Fill #0

## 2017-01-11 MED FILL — CYCLOBENZAPRINE 10 MG TAB: 10 | 20 days supply | Qty: 60 | Fill #0

## 2017-01-11 MED FILL — OXYCODONE-APAP 10-325: 10-325 | 20 days supply | Qty: 60 | Fill #0

## 2017-01-13 MED FILL — MINIVELLE 0.075 MG PATCH: 0.075 | 28 days supply | Qty: 8 | Fill #3

## 2017-01-13 MED FILL — clonazePAM 1 MG TABS: 1 | 30 days supply | Qty: 60 | Fill #5

## 2017-01-25 ENCOUNTER — Other Ambulatory Visit (HOSPITAL_COMMUNITY)
Admission: RE | Admit: 2017-01-25 | Discharge: 2017-01-25 | Disposition: A | Discharge status: Home / Self Care | Payer: 59 | Source: Ambulatory Visit | Attending: Neurological Surgery | Admitting: Neurological Surgery

## 2017-01-25 DIAGNOSIS — Z01812 Encounter for preprocedural laboratory examination: Secondary | ICD-10-CM | POA: Insufficient documentation

## 2017-01-25 DIAGNOSIS — M542 Cervicalgia: Secondary | ICD-10-CM | POA: Insufficient documentation

## 2017-01-25 LAB — BASIC METABOLIC PANEL
Anion gap: 9 (ref 5–15)
BUN: 10 mg/dL (ref 6–20)
CHLORIDE: 99 mmol/L — AB (ref 101–111)
CO2: 27 mmol/L (ref 22–32)
Calcium: 9.5 mg/dL (ref 8.9–10.3)
Creatinine, Ser: 1.02 mg/dL — ABNORMAL HIGH (ref 0.44–1.00)
GFR calc Af Amer: >60 mL/min (ref >60)
GFR calc non Af Amer: >60 mL/min (ref >60)
GLUCOSE: 166 mg/dL — AB (ref 65–99)
POTASSIUM: 3.8 mmol/L (ref 3.5–5.1)
Sodium: 135 mmol/L (ref 135–145)

## 2017-01-25 LAB — CBC
HCT: 42.7 % (ref 36.0–46.0)
HEMOGLOBIN: 14.3 g/dL (ref 12.0–15.0)
MCH: 28.5 pg (ref 26.0–34.0)
MCHC: 33.5 g/dL (ref 30.0–36.0)
MCV: 85.2 fL (ref 78.0–100.0)
Platelets: 256 10*3/uL (ref 150–400)
RBC: 5.01 MIL/uL (ref 3.87–5.11)
RDW: 14.2 % (ref 11.5–15.5)
WBC: 7.7 10*3/uL (ref 4.0–10.5)

## 2017-01-29 MED FILL — BUTALB-ACETAMIN-CAFF 50-325: 50-325-40 | 7 days supply | Qty: 60 | Fill #3

## 2017-02-02 MED FILL — METHYLPREDNISOLONE 4 MG TAB: 4 | 6 days supply | Qty: 21 | Fill #0

## 2017-02-05 DIAGNOSIS — M50223 Other cervical disc displacement at C6-C7 level: Secondary | ICD-10-CM | POA: Diagnosis not present

## 2017-02-05 DIAGNOSIS — M542 Cervicalgia: Secondary | ICD-10-CM | POA: Diagnosis not present

## 2017-02-05 DIAGNOSIS — G5603 Carpal tunnel syndrome, bilateral upper limbs: Secondary | ICD-10-CM | POA: Diagnosis not present

## 2017-02-05 DIAGNOSIS — G5601 Carpal tunnel syndrome, right upper limb: Secondary | ICD-10-CM | POA: Diagnosis not present

## 2017-02-05 DIAGNOSIS — M50222 Other cervical disc displacement at C5-C6 level: Secondary | ICD-10-CM | POA: Diagnosis not present

## 2017-02-05 HISTORY — PX: CERVICAL DISC SURGERY: SHX588

## 2017-02-05 MED FILL — OXYCODONE/APAP 5/325 MG TAB: 5-325 | 7 days supply | Qty: 60 | Fill #0

## 2017-02-05 MED FILL — CYCLOBENZAPRINE 10 MG TAB: 10 | 20 days supply | Qty: 60 | Fill #0

## 2017-02-10 MED FILL — ESOMEPRAZOLE MAG DR 40 MG C: 40 | 90 days supply | Qty: 90 | Fill #0

## 2017-02-12 MED FILL — NUCYNTA 75 MG TABLET: 75 | 7 days supply | Qty: 30 | Fill #0

## 2017-02-12 MED FILL — METHYLPREDNISOLONE 4 MG TAB: 4 | 6 days supply | Qty: 21 | Fill #0

## 2017-02-16 DIAGNOSIS — M542 Cervicalgia: Secondary | ICD-10-CM | POA: Diagnosis not present

## 2017-02-18 MED FILL — clonazePAM 1 MG TABS: 1 | 30 days supply | Qty: 60 | Fill #0

## 2017-02-23 DIAGNOSIS — M9905 Segmental and somatic dysfunction of pelvic region: Secondary | ICD-10-CM | POA: Diagnosis not present

## 2017-02-23 DIAGNOSIS — M9902 Segmental and somatic dysfunction of thoracic region: Secondary | ICD-10-CM | POA: Diagnosis not present

## 2017-02-23 DIAGNOSIS — M9903 Segmental and somatic dysfunction of lumbar region: Secondary | ICD-10-CM | POA: Diagnosis not present

## 2017-02-23 DIAGNOSIS — M62838 Other muscle spasm: Secondary | ICD-10-CM | POA: Diagnosis not present

## 2017-02-23 DIAGNOSIS — M5386 Other specified dorsopathies, lumbar region: Secondary | ICD-10-CM | POA: Diagnosis not present

## 2017-02-24 MED FILL — CYCLOBENZAPRINE 10 MG TAB: 10 | 10 days supply | Qty: 30 | Fill #0

## 2017-02-24 MED FILL — BUTALB-ACETAMIN-CAFF 50-325: 50-325-40 | 7 days supply | Qty: 60 | Fill #0

## 2017-02-25 DIAGNOSIS — M9905 Segmental and somatic dysfunction of pelvic region: Secondary | ICD-10-CM | POA: Diagnosis not present

## 2017-02-25 DIAGNOSIS — M9903 Segmental and somatic dysfunction of lumbar region: Secondary | ICD-10-CM | POA: Diagnosis not present

## 2017-02-25 DIAGNOSIS — M5386 Other specified dorsopathies, lumbar region: Secondary | ICD-10-CM | POA: Diagnosis not present

## 2017-02-25 DIAGNOSIS — M62838 Other muscle spasm: Secondary | ICD-10-CM | POA: Diagnosis not present

## 2017-02-25 DIAGNOSIS — M9902 Segmental and somatic dysfunction of thoracic region: Secondary | ICD-10-CM | POA: Diagnosis not present

## 2017-02-25 MED FILL — IRBESARTAN 300 MG TABLET: 300 | 30 days supply | Qty: 30 | Fill #0

## 2017-02-25 MED FILL — AMLODIPINE BESYLATE 5 MG TA: 5 | 30 days supply | Qty: 30 | Fill #0

## 2017-03-02 DIAGNOSIS — M9903 Segmental and somatic dysfunction of lumbar region: Secondary | ICD-10-CM | POA: Diagnosis not present

## 2017-03-02 DIAGNOSIS — M62838 Other muscle spasm: Secondary | ICD-10-CM | POA: Diagnosis not present

## 2017-03-02 DIAGNOSIS — M5386 Other specified dorsopathies, lumbar region: Secondary | ICD-10-CM | POA: Diagnosis not present

## 2017-03-02 DIAGNOSIS — M9905 Segmental and somatic dysfunction of pelvic region: Secondary | ICD-10-CM | POA: Diagnosis not present

## 2017-03-02 DIAGNOSIS — M9902 Segmental and somatic dysfunction of thoracic region: Secondary | ICD-10-CM | POA: Diagnosis not present

## 2017-03-08 DIAGNOSIS — M9902 Segmental and somatic dysfunction of thoracic region: Secondary | ICD-10-CM | POA: Diagnosis not present

## 2017-03-08 DIAGNOSIS — M9903 Segmental and somatic dysfunction of lumbar region: Secondary | ICD-10-CM | POA: Diagnosis not present

## 2017-03-08 DIAGNOSIS — M9905 Segmental and somatic dysfunction of pelvic region: Secondary | ICD-10-CM | POA: Diagnosis not present

## 2017-03-08 DIAGNOSIS — M62838 Other muscle spasm: Secondary | ICD-10-CM | POA: Diagnosis not present

## 2017-03-08 DIAGNOSIS — M5386 Other specified dorsopathies, lumbar region: Secondary | ICD-10-CM | POA: Diagnosis not present

## 2017-03-09 DIAGNOSIS — M62838 Other muscle spasm: Secondary | ICD-10-CM | POA: Diagnosis not present

## 2017-03-09 DIAGNOSIS — M9903 Segmental and somatic dysfunction of lumbar region: Secondary | ICD-10-CM | POA: Diagnosis not present

## 2017-03-09 DIAGNOSIS — M9902 Segmental and somatic dysfunction of thoracic region: Secondary | ICD-10-CM | POA: Diagnosis not present

## 2017-03-09 DIAGNOSIS — M9905 Segmental and somatic dysfunction of pelvic region: Secondary | ICD-10-CM | POA: Diagnosis not present

## 2017-03-09 DIAGNOSIS — M5386 Other specified dorsopathies, lumbar region: Secondary | ICD-10-CM | POA: Diagnosis not present

## 2017-03-15 DIAGNOSIS — M9902 Segmental and somatic dysfunction of thoracic region: Secondary | ICD-10-CM | POA: Diagnosis not present

## 2017-03-15 DIAGNOSIS — M5386 Other specified dorsopathies, lumbar region: Secondary | ICD-10-CM | POA: Diagnosis not present

## 2017-03-15 DIAGNOSIS — M62838 Other muscle spasm: Secondary | ICD-10-CM | POA: Diagnosis not present

## 2017-03-15 DIAGNOSIS — M9905 Segmental and somatic dysfunction of pelvic region: Secondary | ICD-10-CM | POA: Diagnosis not present

## 2017-03-15 DIAGNOSIS — M9903 Segmental and somatic dysfunction of lumbar region: Secondary | ICD-10-CM | POA: Diagnosis not present

## 2017-03-15 DIAGNOSIS — M545 Low back pain: Secondary | ICD-10-CM | POA: Diagnosis not present

## 2017-03-15 MED FILL — CYCLOBENZAPRINE 10 MG TAB: 10 | 10 days supply | Qty: 30 | Fill #1

## 2017-03-15 MED FILL — predniSONE 10 MG TABS: 10 | 8 days supply | Qty: 20 | Fill #0

## 2017-03-15 MED FILL — TEMAZEPAM 30 MG CAPSULE: 30 | 90 days supply | Qty: 90 | Fill #1

## 2017-03-15 MED FILL — HYDROCODON-APAP 5-325: 5-325 | 5 days supply | Qty: 20 | Fill #0

## 2017-03-16 MED FILL — PROMETHAZINE 25 MG TABLET: 25 | 22 days supply | Qty: 90 | Fill #0

## 2017-03-17 DIAGNOSIS — M5386 Other specified dorsopathies, lumbar region: Secondary | ICD-10-CM | POA: Diagnosis not present

## 2017-03-17 DIAGNOSIS — M9902 Segmental and somatic dysfunction of thoracic region: Secondary | ICD-10-CM | POA: Diagnosis not present

## 2017-03-17 DIAGNOSIS — M9903 Segmental and somatic dysfunction of lumbar region: Secondary | ICD-10-CM | POA: Diagnosis not present

## 2017-03-17 DIAGNOSIS — M62838 Other muscle spasm: Secondary | ICD-10-CM | POA: Diagnosis not present

## 2017-03-17 DIAGNOSIS — M9905 Segmental and somatic dysfunction of pelvic region: Secondary | ICD-10-CM | POA: Diagnosis not present

## 2017-03-18 MED FILL — OXYCODONE/APAP 5/325 MG TAB: 5-325 | 5 days supply | Qty: 20 | Fill #0

## 2017-03-19 DIAGNOSIS — M9903 Segmental and somatic dysfunction of lumbar region: Secondary | ICD-10-CM | POA: Diagnosis not present

## 2017-03-19 DIAGNOSIS — M62838 Other muscle spasm: Secondary | ICD-10-CM | POA: Diagnosis not present

## 2017-03-19 DIAGNOSIS — M9902 Segmental and somatic dysfunction of thoracic region: Secondary | ICD-10-CM | POA: Diagnosis not present

## 2017-03-19 DIAGNOSIS — M5386 Other specified dorsopathies, lumbar region: Secondary | ICD-10-CM | POA: Diagnosis not present

## 2017-03-19 DIAGNOSIS — M9905 Segmental and somatic dysfunction of pelvic region: Secondary | ICD-10-CM | POA: Diagnosis not present

## 2017-03-19 MED FILL — clonazePAM 1 MG TABS: 1 | 30 days supply | Qty: 60 | Fill #1

## 2017-03-22 ENCOUNTER — Other Ambulatory Visit: Payer: Self-pay | Admitting: Obstetrics and Gynecology

## 2017-03-22 DIAGNOSIS — Z1231 Encounter for screening mammogram for malignant neoplasm of breast: Secondary | ICD-10-CM

## 2017-03-23 DIAGNOSIS — M9902 Segmental and somatic dysfunction of thoracic region: Secondary | ICD-10-CM | POA: Diagnosis not present

## 2017-03-23 DIAGNOSIS — M62838 Other muscle spasm: Secondary | ICD-10-CM | POA: Diagnosis not present

## 2017-03-23 DIAGNOSIS — M9903 Segmental and somatic dysfunction of lumbar region: Secondary | ICD-10-CM | POA: Diagnosis not present

## 2017-03-23 DIAGNOSIS — M5386 Other specified dorsopathies, lumbar region: Secondary | ICD-10-CM | POA: Diagnosis not present

## 2017-03-23 DIAGNOSIS — M9905 Segmental and somatic dysfunction of pelvic region: Secondary | ICD-10-CM | POA: Diagnosis not present

## 2017-03-25 DIAGNOSIS — M5386 Other specified dorsopathies, lumbar region: Secondary | ICD-10-CM | POA: Diagnosis not present

## 2017-03-25 DIAGNOSIS — M9902 Segmental and somatic dysfunction of thoracic region: Secondary | ICD-10-CM | POA: Diagnosis not present

## 2017-03-25 DIAGNOSIS — M62838 Other muscle spasm: Secondary | ICD-10-CM | POA: Diagnosis not present

## 2017-03-25 DIAGNOSIS — M9905 Segmental and somatic dysfunction of pelvic region: Secondary | ICD-10-CM | POA: Diagnosis not present

## 2017-03-25 DIAGNOSIS — M9903 Segmental and somatic dysfunction of lumbar region: Secondary | ICD-10-CM | POA: Diagnosis not present

## 2017-03-25 MED FILL — BACLOFEN 10 MG TABLET: 10 | 90 days supply | Qty: 270 | Fill #1

## 2017-03-25 MED FILL — BUTALB-ACETAMIN-CAFF 50-325: 50-325-40 | 7 days supply | Qty: 60 | Fill #1

## 2017-03-26 DIAGNOSIS — M9903 Segmental and somatic dysfunction of lumbar region: Secondary | ICD-10-CM | POA: Diagnosis not present

## 2017-03-26 DIAGNOSIS — M9902 Segmental and somatic dysfunction of thoracic region: Secondary | ICD-10-CM | POA: Diagnosis not present

## 2017-03-26 DIAGNOSIS — M62838 Other muscle spasm: Secondary | ICD-10-CM | POA: Diagnosis not present

## 2017-03-26 DIAGNOSIS — M9905 Segmental and somatic dysfunction of pelvic region: Secondary | ICD-10-CM | POA: Diagnosis not present

## 2017-03-26 DIAGNOSIS — M5386 Other specified dorsopathies, lumbar region: Secondary | ICD-10-CM | POA: Diagnosis not present

## 2017-03-29 DIAGNOSIS — M542 Cervicalgia: Secondary | ICD-10-CM | POA: Diagnosis not present

## 2017-03-29 MED FILL — tiZANidine HCL 4 MG TABS: 4 | 20 days supply | Qty: 60 | Fill #0

## 2017-03-30 DIAGNOSIS — M5386 Other specified dorsopathies, lumbar region: Secondary | ICD-10-CM | POA: Diagnosis not present

## 2017-03-30 DIAGNOSIS — M9902 Segmental and somatic dysfunction of thoracic region: Secondary | ICD-10-CM | POA: Diagnosis not present

## 2017-03-30 DIAGNOSIS — M9903 Segmental and somatic dysfunction of lumbar region: Secondary | ICD-10-CM | POA: Diagnosis not present

## 2017-03-30 DIAGNOSIS — M9905 Segmental and somatic dysfunction of pelvic region: Secondary | ICD-10-CM | POA: Diagnosis not present

## 2017-03-30 DIAGNOSIS — M62838 Other muscle spasm: Secondary | ICD-10-CM | POA: Diagnosis not present

## 2017-03-31 DIAGNOSIS — Z6834 Body mass index (BMI) 34.0-34.9, adult: Secondary | ICD-10-CM | POA: Diagnosis not present

## 2017-03-31 DIAGNOSIS — Z01419 Encounter for gynecological examination (general) (routine) without abnormal findings: Secondary | ICD-10-CM | POA: Diagnosis not present

## 2017-03-31 MED FILL — MINIVELLE 0.075 MG PATCH: 0.075 | 28 days supply | Qty: 8 | Fill #0

## 2017-04-07 ENCOUNTER — Ambulatory Visit
Admission: RE | Admit: 2017-04-07 | Discharge: 2017-04-07 | Disposition: A | Discharge status: Home / Self Care | Payer: 59 | Source: Ambulatory Visit | Attending: Obstetrics and Gynecology | Admitting: Obstetrics and Gynecology

## 2017-04-07 DIAGNOSIS — Z1231 Encounter for screening mammogram for malignant neoplasm of breast: Secondary | ICD-10-CM

## 2017-04-08 ENCOUNTER — Ambulatory Visit: Payer: 59

## 2017-04-21 MED FILL — clonazePAM 1 MG TABS: 1 | 30 days supply | Qty: 60 | Fill #2

## 2017-04-22 ENCOUNTER — Ambulatory Visit: Payer: 59

## 2017-04-23 MED FILL — tiZANidine HCL 4 MG TABS: 4 | 20 days supply | Qty: 60 | Fill #1

## 2017-04-23 MED FILL — FUROSEMIDE 20 MG TABLET: 20 | 30 days supply | Qty: 30 | Fill #0

## 2017-04-23 MED FILL — BUTALB-ACETAMIN-CAFF 50-325: 50-325-40 | 7 days supply | Qty: 60 | Fill #2

## 2017-04-26 ENCOUNTER — Emergency Department (HOSPITAL_COMMUNITY): Payer: 59

## 2017-04-26 ENCOUNTER — Encounter (HOSPITAL_COMMUNITY): Payer: Self-pay | Admitting: Obstetrics and Gynecology

## 2017-04-26 ENCOUNTER — Observation Stay (HOSPITAL_COMMUNITY)
Admission: EM | Admit: 2017-04-26 | Discharge: 2017-04-28 | Disposition: A | Discharge status: Home / Self Care | Payer: 59 | Attending: Internal Medicine | Admitting: Internal Medicine

## 2017-04-26 DIAGNOSIS — R4182 Altered mental status, unspecified: Secondary | ICD-10-CM | POA: Diagnosis not present

## 2017-04-26 DIAGNOSIS — K219 Gastro-esophageal reflux disease without esophagitis: Secondary | ICD-10-CM | POA: Insufficient documentation

## 2017-04-26 DIAGNOSIS — R55 Syncope and collapse: Secondary | ICD-10-CM | POA: Diagnosis not present

## 2017-04-26 DIAGNOSIS — H538 Other visual disturbances: Secondary | ICD-10-CM | POA: Diagnosis not present

## 2017-04-26 DIAGNOSIS — N390 Urinary tract infection, site not specified: Secondary | ICD-10-CM | POA: Diagnosis not present

## 2017-04-26 DIAGNOSIS — I10 Essential (primary) hypertension: Secondary | ICD-10-CM | POA: Diagnosis not present

## 2017-04-26 DIAGNOSIS — Z6836 Body mass index (BMI) 36.0-36.9, adult: Secondary | ICD-10-CM | POA: Diagnosis not present

## 2017-04-26 DIAGNOSIS — I959 Hypotension, unspecified: Secondary | ICD-10-CM | POA: Insufficient documentation

## 2017-04-26 DIAGNOSIS — Z79899 Other long term (current) drug therapy: Secondary | ICD-10-CM | POA: Diagnosis not present

## 2017-04-26 DIAGNOSIS — R471 Dysarthria and anarthria: Secondary | ICD-10-CM | POA: Diagnosis not present

## 2017-04-26 DIAGNOSIS — E860 Dehydration: Secondary | ICD-10-CM | POA: Insufficient documentation

## 2017-04-26 DIAGNOSIS — R404 Transient alteration of awareness: Secondary | ICD-10-CM | POA: Diagnosis not present

## 2017-04-26 DIAGNOSIS — R4781 Slurred speech: Secondary | ICD-10-CM | POA: Diagnosis not present

## 2017-04-26 DIAGNOSIS — E669 Obesity, unspecified: Secondary | ICD-10-CM | POA: Insufficient documentation

## 2017-04-26 DIAGNOSIS — R600 Localized edema: Secondary | ICD-10-CM | POA: Diagnosis not present

## 2017-04-26 DIAGNOSIS — Z880 Allergy status to penicillin: Secondary | ICD-10-CM | POA: Diagnosis not present

## 2017-04-26 DIAGNOSIS — D62 Acute posthemorrhagic anemia: Secondary | ICD-10-CM | POA: Insufficient documentation

## 2017-04-26 DIAGNOSIS — E876 Hypokalemia: Secondary | ICD-10-CM | POA: Insufficient documentation

## 2017-04-26 DIAGNOSIS — Z882 Allergy status to sulfonamides status: Secondary | ICD-10-CM | POA: Diagnosis not present

## 2017-04-26 DIAGNOSIS — D649 Anemia, unspecified: Secondary | ICD-10-CM | POA: Diagnosis not present

## 2017-04-26 DIAGNOSIS — Z90710 Acquired absence of both cervix and uterus: Secondary | ICD-10-CM | POA: Insufficient documentation

## 2017-04-26 LAB — BASIC METABOLIC PANEL
Anion gap: 5 (ref 5–15)
BUN: 9 mg/dL (ref 6–20)
CALCIUM: 6.4 mg/dL — AB (ref 8.9–10.3)
CHLORIDE: 113 mmol/L — AB (ref 101–111)
CO2: 20 mmol/L — ABNORMAL LOW (ref 22–32)
Creatinine, Ser: 0.76 mg/dL (ref 0.44–1.00)
GFR calc Af Amer: >60 mL/min (ref >60)
GLUCOSE: 89 mg/dL (ref 65–99)
POTASSIUM: 2.7 mmol/L — AB (ref 3.5–5.1)
Sodium: 138 mmol/L (ref 135–145)

## 2017-04-26 LAB — URINALYSIS, ROUTINE W REFLEX MICROSCOPIC
Bilirubin Urine: NEGATIVE
GLUCOSE, UA: NEGATIVE mg/dL
Hgb urine dipstick: NEGATIVE
KETONES UR: NEGATIVE mg/dL
NITRITE: NEGATIVE
PH: 5 (ref 5.0–8.0)
Protein, ur: NEGATIVE mg/dL
Specific Gravity, Urine: 1.013 (ref 1.005–1.030)

## 2017-04-26 LAB — MAGNESIUM: Magnesium: 1.2 mg/dL — ABNORMAL LOW (ref 1.7–2.4)

## 2017-04-26 LAB — I-STAT TROPONIN, ED
TROPONIN I, POC: 0 ng/mL (ref 0.00–0.08)
TROPONIN I, POC: 0 ng/mL (ref 0.00–0.08)

## 2017-04-26 LAB — POC OCCULT BLOOD, ED: Fecal Occult Bld: NEGATIVE

## 2017-04-26 LAB — CBC
HCT: 28.1 % — ABNORMAL LOW (ref 36.0–46.0)
Hemoglobin: 9.2 g/dL — ABNORMAL LOW (ref 12.0–15.0)
MCH: 28.6 pg (ref 26.0–34.0)
MCHC: 32.7 g/dL (ref 30.0–36.0)
MCV: 87.3 fL (ref 78.0–100.0)
PLATELETS: 182 10*3/uL (ref 150–400)
RBC: 3.22 MIL/uL — ABNORMAL LOW (ref 3.87–5.11)
RDW: 15 % (ref 11.5–15.5)
WBC: 6.7 10*3/uL (ref 4.0–10.5)

## 2017-04-26 LAB — PROTIME-INR
INR: 1.13
PROTHROMBIN TIME: 14.6 s (ref 11.4–15.2)

## 2017-04-26 LAB — TSH: TSH: 4.193 u[IU]/mL (ref 0.350–4.500)

## 2017-04-26 LAB — CBG MONITORING, ED: Glucose-Capillary: 117 mg/dL — ABNORMAL HIGH (ref 65–99)

## 2017-04-26 LAB — LIPASE, BLOOD: LIPASE: 19 U/L (ref 11–51)

## 2017-04-26 LAB — I-STAT CG4 LACTIC ACID, ED: Lactic Acid, Venous: 1.69 mmol/L (ref 0.5–1.9)

## 2017-04-26 MED ORDER — SODIUM CHLORIDE 0.9 % IV BOLUS (SEPSIS)
1000.0000 mL | Freq: Once | INTRAVENOUS | Status: AC
  Administered 2017-04-26: 1000 mL via INTRAVENOUS

## 2017-04-26 MED ORDER — POTASSIUM CHLORIDE 10 MEQ/100ML IV SOLN
10.0000 meq | INTRAVENOUS | Status: AC
  Administered 2017-04-26 – 2017-04-27 (×4): 10 meq via INTRAVENOUS
  Filled 2017-04-26 (×4): qty 100

## 2017-04-26 MED ORDER — MAGNESIUM SULFATE 2 GM/50ML IV SOLN
2.0000 g | Freq: Once | INTRAVENOUS | Status: AC
  Administered 2017-04-26: 2 g via INTRAVENOUS
  Filled 2017-04-26: qty 50

## 2017-04-26 NOTE — ED Triage Notes (Signed)
Pt states she was at the dentist and felt dizzy after receiving nitrous oxide and had trouble concentrating. Pt said she has not eaten anything in over 24 hours and has been under a lot of stress lately as she is a Marine scientist at [employer] states she was given juice at the dentist and when her husband came to driver her home she felt dizzy once again. Husband states he helped lower her to the ground. Pt states she never hit her head or any other extremities.

## 2017-04-26 NOTE — ED Notes (Signed)
Pt. Made aware for the need of urine, unable to give urine specimen at this time.

## 2017-04-26 NOTE — ED Notes (Signed)
MD at bedside. 

## 2017-04-26 NOTE — ED Triage Notes (Signed)
Per EMS: Pt was at the dentist office, pt was given nitrous oxide and then the dentist decided not to do the procedure. Pt has had 2 falls, no noted abnormalities Pt seemed to have slurred speech at one point but after PO fluids it was resolved and no other stroke symptoms 20 L Hand Pt complaining of dizziness and pressure in her head Pt does take lasix Last Vitals: 82/54 O2 98% on RA RR 18 CBG 158

## 2017-04-26 NOTE — ED Provider Notes (Addendum)
[place] DEPT Provider Note   CSN: [id number] Arrival date & time: 04/26/17  1911     History   Chief Complaint Chief Complaint  Patient presents with  . Near Syncope    HPI Theresa Henry is a 51 y.o. female.  The history is provided by the patient, the spouse and medical records. No language interpreter was used.  Near Syncope  This is a new problem. The current episode started 1 to 2 hours ago. The problem occurs constantly. The problem has not changed since onset.Pertinent negatives include no chest pain, no abdominal pain, no headaches and no shortness of breath. Nothing aggravates the symptoms. Nothing relieves the symptoms. She has tried nothing for the symptoms. The treatment provided no relief.    Past Medical History:  Diagnosis Date  . Allergy   . GERD (gastroesophageal reflux disease)   . Hypertension   . Migraine   . Obesity     There are no active problems to display for this patient.   Past Surgical History:  Procedure Laterality Date  . ABDOMINAL HYSTERECTOMY    . BREAST REDUCTION SURGERY    . LAPAROSCOPY    . TYMPANOSTOMY TUBE PLACEMENT    . WISDOM TOOTH EXTRACTION      OB History    No data available       Home Medications    Prior to Admission medications   Medication Sig Start Date End Date Taking? Authorizing Provider  amLODipine (NORVASC) 5 MG tablet Take 5 mg by mouth daily.    [provider]  baclofen (LIORESAL) 10 MG tablet Take 10 mg by mouth 3 (three) times daily.    [provider]  butalbital-acetaminophen-caffeine (FIORICET, ESGIC) 50-325-40 MG per tablet Take 1 tablet by mouth 2 (two) times daily as needed for headache (headache).     [provider]  cetirizine (ZYRTEC) 10 MG tablet Take 10 mg by mouth at bedtime.    [provider]  CLONAZEPAM PO Take by mouth as needed.    [provider]  Cyclobenzaprine HCl (FLEXERIL PO) Take by mouth as needed.    [provider]  esomeprazole (NEXIUM) 40 MG capsule Take 40 mg by mouth daily at 12 noon.    [provider]  estradiol (VIVELLE-DOT) 0.025 MG/24HR Place 1 patch onto the skin 2 (two) times a week.    [provider]  irbesartan (AVAPRO) 300 MG tablet Take 300 mg by mouth daily.    [provider]  Naftifine HCl (NAFTIN) 2 % CREA Apply cream to area once daily for 2 weeks. 01/05/14   Bronson Ing, DPM  Oxycodone-Acetaminophen (PERCOCET PO) Take by mouth as needed.    [provider]  promethazine (PHENERGAN) 25 MG tablet Take 25 mg by mouth every 6 (six) hours as needed for nausea (nausea).     [provider]  ranitidine (ZANTAC) 150 MG tablet Take 150 mg by mouth as needed for heartburn.    [provider]  temazepam (RESTORIL) 22.5 MG capsule Take 22.5 mg by mouth at bedtime as needed for sleep (sleep).     [provider]    Family History Family History  Problem Relation Age of Onset  . Ovarian cancer Mother   . High Cholesterol Father   . Heart disease Father   . Migraines Brother   . Migraines Paternal Grandfather   . Migraines Daughter     Social History Social History  Substance Use Topics  .  Smoking status: Never Smoker  . Smokeless tobacco: Never Used  . Alcohol use No     Allergies   Diphenhydramine; Sulfa antibiotics; Sulfamethoxazole-trimethoprim; Amoxicillin-pot clavulanate; and Penicillins   Review of Systems Review of Systems  Constitutional: Positive for appetite change (decreased) and fatigue. Negative for chills, diaphoresis and fever.  HENT: Negative for congestion and rhinorrhea.   Eyes: Negative for visual disturbance.  Respiratory: Negative for cough, choking, chest tightness, shortness of breath, wheezing and stridor.   Cardiovascular: Positive for near-syncope. Negative for chest pain, palpitations and leg swelling.  Gastrointestinal: Negative for abdominal pain.  Genitourinary: Negative for  flank pain.  Musculoskeletal: Negative for back pain, neck pain and neck stiffness.  Skin: Negative for rash and wound.  Neurological: Positive for light-headedness. Negative for seizures, syncope (near syncope present), weakness and headaches.  Psychiatric/Behavioral: Negative for agitation.  All other systems reviewed and are negative.    Physical Exam Updated Vital Signs BP (!) 88/59 (BP Location: Right Arm)   Pulse (!) 56   Resp 16   SpO2 98%   Physical Exam  Constitutional: She is oriented to person, place, and time. She appears well-developed and well-nourished. No distress.  HENT:  Head: Normocephalic and atraumatic.  Mouth/Throat: Oropharynx is clear and moist. No oropharyngeal exudate.  Eyes: Conjunctivae and EOM are normal. Pupils are equal, round, and reactive to light.  Neck: Normal range of motion. Neck supple.  Cardiovascular: Normal rate, regular rhythm, normal heart sounds and intact distal pulses.   No murmur heard. Pulmonary/Chest: Effort normal and breath sounds normal. No stridor. No respiratory distress. She has no wheezes. She exhibits no tenderness.  Abdominal: Soft. There is no tenderness.  Musculoskeletal: She exhibits no edema or tenderness.  Neurological: She is alert and oriented to person, place, and time. No cranial nerve deficit or sensory deficit. She exhibits normal muscle tone. Coordination normal.  Skin: Skin is warm and dry. Capillary refill takes less than 2 seconds. No rash noted. She is not diaphoretic. No erythema.  Psychiatric: She has a normal mood and affect.  Nursing note and vitals reviewed.    ED Treatments / Results  Labs (all labs ordered are listed, but only abnormal results are displayed) Labs Reviewed  BASIC METABOLIC PANEL - Abnormal; Notable for the following:       Result Value   Potassium 2.7 (*)    Chloride 113 (*)    CO2 20 (*)    Calcium 6.4 (*)    All other components within normal limits  CBC - Abnormal; Notable  for the following:    RBC 3.22 (*)    Hemoglobin 9.2 (*)    HCT 28.1 (*)    All other components within normal limits  URINALYSIS, ROUTINE W REFLEX MICROSCOPIC - Abnormal; Notable for the following:    APPearance HAZY (*)    Leukocytes, UA LARGE (*)    Bacteria, UA RARE (*)    Squamous Epithelial / LPF 0-5 (*)    Non Squamous Epithelial 0-5 (*)    All other components within normal limits  MAGNESIUM - Abnormal; Notable for the following:    Magnesium 1.2 (*)    All other components within normal limits  CBG MONITORING, ED - Abnormal; Notable for the following:    Glucose-Capillary 117 (*)    All other components within normal limits  LIPASE, BLOOD  PROTIME-INR  TSH  CALCIUM, IONIZED  HIV ANTIBODY (ROUTINE TESTING)  BASIC METABOLIC PANEL  CBC  I-STAT CG4 LACTIC ACID,  ED  Randolm Idol, ED  POC OCCULT BLOOD, ED  I-STAT CG4 LACTIC ACID, ED  I-STAT TROPOININ, ED  TYPE AND SCREEN  ABO/RH    EKG  EKG Interpretation  Date/Time:  Monday Apr 26 2017 20:03:15 EDT Ventricular Rate:  61 PR Interval:    QRS Duration: 87 QT Interval:  490 QTC Calculation: 494 R Axis:   17 Text Interpretation:  Sinus rhythm Low voltage, precordial leads Borderline prolonged QT interval No prior ECg for comparison.  T wave inversion in lead 3. No STEMI Confirmed by Sherry Ruffing MD, [place] [phone]) on 04/26/2017 8:07:42 PM       Radiology Dg Chest 2 View  Result Date: 04/26/2017 CLINICAL DATA:  Near-syncope, hypotension after receiving nitrous oxide at dentist office. Two falls. Slurred speech. History of migraine, hypertension. EXAM: CHEST  2 VIEW COMPARISON:  Chest radiograph August 05, 2016 FINDINGS: Cardiomediastinal silhouette is normal. No pleural effusions or focal consolidations. Trachea projects midline and there is no pneumothorax. Soft tissue planes and included osseous structures are non-suspicious. ACDF. IMPRESSION: No acute cardiopulmonary process. Electronically Signed   By:  Elon Alas M.D.   On: 04/26/2017 21:00    Procedures Procedures (including critical care time)  CRITICAL CARE Performed by: Gwenyth Allegra Amil Bouwman Total critical care time: 45 minutes Critical care time was exclusive of separately billable procedures and treating other patients. Critical care was necessary to treat or prevent imminent or life-threatening deterioration. Severe lab abnormalities and hypotension. Critical care was time spent personally by me on the following activities: development of treatment plan with patient and/or surrogate as well as nursing, discussions with consultants, evaluation of patient's response to treatment, examination of patient, obtaining history from patient or surrogate, ordering and performing treatments and interventions, ordering and review of laboratory studies, ordering and review of radiographic studies, pulse oximetry and re-evaluation of patient's condition.   Medications Ordered in ED Medications  potassium chloride 10 mEq in 100 mL IVPB (10 mEq Intravenous New Bag/Given 04/27/17 0128)  temazepam (RESTORIL) capsule 30 mg (30 mg Oral Given 04/27/17 0133)  baclofen (LIORESAL) tablet 10 mg (not administered)  pantoprazole (PROTONIX) EC tablet 40 mg (not administered)  acetaminophen (TYLENOL) tablet 650 mg (not administered)    Or  acetaminophen (TYLENOL) suppository 650 mg (not administered)  ondansetron (ZOFRAN) tablet 4 mg (not administered)    Or  ondansetron (ZOFRAN) injection 4 mg (not administered)  sodium chloride flush (NS) 0.9 % injection 3 mL (3 mLs Intravenous Not Given 04/27/17 0129)  lactated ringers infusion ( Intravenous New Bag/Given 04/27/17 0129)  calcium gluconate 2 g in sodium chloride 0.9 % 100 mL IVPB (2 g Intravenous New Bag/Given 04/27/17 0133)  sodium chloride 0.9 % bolus 1,000 mL (0 mLs Intravenous Stopped 04/26/17 2156)  sodium chloride 0.9 % bolus 1,000 mL (1,000 mLs Intravenous New Bag/Given 04/26/17 2044)  magnesium  sulfate IVPB 2 g 50 mL (0 g Intravenous Stopped 04/27/17 0038)     Initial Impression / Assessment and Plan / ED Course  I have reviewed the triage vital signs and the nursing notes.  Pertinent labs & imaging results that were available during my care of the patient were reviewed by me and considered in my medical decision making (see chart for details).     GUYNELL KLEIBER is a 51 y.o. female past medical history significant for hypertension, GERD, and migraines who presents for hypotension and near syncope. Patient reports that she was at her dentist office getting a dental procedure when she  was given inhaled nitrous oxide for sedation. Patient began feeling lightheaded and confused. Patient became near syncopal. Patient's blood pressure at that time was in the 80s. Procedure was canceled and patient was sent to the ED for evaluation.  Upon arrival, patient has continued to feel lightheaded. Patient blood pressure was in the low 80s on arrival. Patient was given fluids. Patient denied chest pain, palpitations, abdominal pain. She denied any vomiting but had nausea with the nitrous. She denies any dark tarry stools, recent diarrhea, but does report that she has not been eating and drinking as much for the last 2 days. She denies any new medications or stopping medications. Next  History and exam are seen above. On exam, patient had no focal neurologic deficits. Lungs clear. Abdomen nontender. No murmurs. No JVD.  Initial EKG did not show evidence of acute STEMI or other arrhythmia. Initial troponin negative.  Lab testing did return some abnormalities with a hemoglobin of 9.2. Chart review shows that this is decreased from over 14 several months ago and patient said it was around 13 a week or 2 ago. Potassium also severely decreased at 2.7. Calcium very low at 6.4. Magnesium critically low at 1.2. Patient had both potassium and magnesium ordered for repletion. Fecal occult test was negative for  bleeding. Unsure etiology of anemia however, suspect patient will have a drop after fluids for dilution. Lactic acid nonelevated.  Other lab testing was sent including TSH which was unremarkable. Patient had a type and screen sent.  Given the hypotension as well as the abnormal labs, feel patient needs admission for observation and further management. This may be reaction to the nitrous oxide used in the setting of dehydration from poor oral intake. Hospitalist team agreed to admit patient to determine etiology of symptoms. Patient admitted in stable condition.    Final Clinical Impressions(s) / ED Diagnoses   Final diagnoses:  Hypotension, unspecified hypotension type  Near syncope  Dehydration  Anemia, unspecified type  Hypocalcemia  Hypomagnesemia     Clinical Impression: 1. Hypotension, unspecified hypotension type   2. Near syncope   3. Dehydration   4. Anemia, unspecified type   5. Hypocalcemia   6. Hypomagnesemia     Disposition: Admit to [place], Gwenyth Allegra, MD 04/27/17 0150    Ulrick Methot, Gwenyth Allegra, MD 06/24/17 [phone]

## 2017-04-26 NOTE — H&P (Addendum)
History and Physical    Theresa Henry GGY:[id number] DOB: [birth year] DOA: 04/26/2017  PCP: Shirline Frees, MD Consultants:  Ronnald Ramp - orthopedics; Gertie Fey - GYN Patient coming from: home - lives with husband; NOK: husband, [phone]  Chief Complaint: pre-syncope  HPI: Theresa Henry is a 51 y.o. female with medical history significant of HTN and migraines presenting with persistent pre-syncope following nitrous oxide administration today at the dentist.  Patient went to the dentist this AM - she chipped a tooth and it was previously filed down and they gave her nitrous oxide today to repair it.  She developed blurry vision, slurred speech, altered mental status.  The dentist turned off the nitrous and ran O2.  She sat up and they gave her apple juice and she felt a bit better and then symptoms recurred.  The dentist called 911.  Her glucose was 150-170.  BP was ok and so they cancelled EMS.  She called her husband to come get her and the symptoms recurred.  Her husband was planning to take her home but when she stood up and tried to walk everything got really blurry and she went down, unable to walk.  Never fully lost consciousness.  911 called again - EKG normal, glucose was 150-170, and they suggested she come to the ER.  She felt completely fine before going to the dentist this AM.  Last food was yesterday AM; she had lots of stress yesterday and she didn't feel like eating.  Also hasn't had that much to drink in the last 36 hours.  Currently a little dizzy but no blurred vision.   ED Course: Marked electrolyte abnormalities (repleting) and decreased Hgb of uncertain etiology.  Review of Systems: As per HPI; otherwise review of systems reviewed and negative.   Ambulatory Status:  Ambualtes without assisatnce  Past Medical History:  Diagnosis Date  . Allergy   . GERD (gastroesophageal reflux disease)   . Hypertension   . Migraine   . Obesity     Past Surgical History:  Procedure  Laterality Date  . ABDOMINAL HYSTERECTOMY    . BREAST REDUCTION SURGERY    . LAPAROSCOPY    . TYMPANOSTOMY TUBE PLACEMENT    . WISDOM TOOTH EXTRACTION      Social History   Social History  . Marital status: Married    Spouse name: N/A  . Number of children: N/A  . Years of education: N/A   Occupational History  . Not on file.   Social History Main Topics  . Smoking status: Never Smoker  . Smokeless tobacco: Never Used  . Alcohol use No  . Drug use: No  . Sexual activity: Not on file   Other Topics Concern  . Not on file   Social History Narrative  . No narrative on file    Allergies  Allergen Reactions  . Diphenhydramine     Makes hyperactive  . Sulfa Antibiotics   . Sulfamethoxazole-Trimethoprim     Other reaction(s): GI Upset (intolerance)  . Amoxicillin-Pot Clavulanate Rash  . Penicillins Rash    Family History  Problem Relation Age of Onset  . Ovarian cancer Mother   . High Cholesterol Father   . Heart disease Father   . Migraines Brother   . Migraines Paternal Grandfather   . Migraines Daughter     Prior to Admission medications   Medication Sig Start Date End Date Taking? Authorizing Provider  amLODipine (NORVASC) 5 MG tablet Take 5 mg by mouth daily.  Yes [provider]  baclofen (LIORESAL) 10 MG tablet Take 10 mg by mouth 3 (three) times daily.   Yes [provider]  butalbital-acetaminophen-caffeine (FIORICET, ESGIC) 50-325-40 MG per tablet Take 1 tablet by mouth 2 (two) times daily as needed for headache (headache).    Yes [provider]  cetirizine (ZYRTEC) 10 MG tablet Take 10 mg by mouth at bedtime.   Yes [provider]  CLONAZEPAM PO Take 1 mg by mouth as needed.    Yes [provider]  esomeprazole (NEXIUM) 40 MG capsule Take 40 mg by mouth daily at 12 noon.   Yes [provider]  furosemide (LASIX) 20 MG tablet Take 20 mg by mouth daily. 04/23/17  Yes [provider]    hydrocortisone ointment 0.5 % Apply 1 application topically 2 (two) times daily.   Yes [provider]  irbesartan (AVAPRO) 300 MG tablet Take 300 mg by mouth daily.   Yes [provider]  MINIVELLE 0.075 MG/24HR Place 1 patch onto the skin 2 (two) times a week. 03/31/17  Yes [provider]  promethazine (PHENERGAN) 25 MG tablet Take 25 mg by mouth every 6 (six) hours as needed for nausea (nausea).    Yes [provider]  ranitidine (ZANTAC) 150 MG tablet Take 150 mg by mouth as needed for heartburn.   Yes [provider]  temazepam (RESTORIL) 30 MG capsule Take 30 mg by mouth at bedtime. 03/15/17  Yes [provider]  Vitamin E 100 UNIT/GM CREA Apply 1 g topically daily.   Yes [provider]    Physical Exam: Vitals:   04/26/17 1941 04/26/17 2030 04/26/17 2156 04/27/17 0015  BP: (!) 88/59 (!) 85/66 103/70 104/73  Pulse: (!) 56 63 60 (!) 57  Resp: 16 15 18 13   Temp:   97.7 F (36.5 C)   TempSrc:   Oral   SpO2: 98% 97% 100% 99%     General:  Appears calm and comfortable and is NAD Eyes:  PERRL, EOMI, normal lids, iris ENT:  grossly normal hearing, lips & tongue, mmm Neck:  no LAD, masses or thyromegaly Cardiovascular:  RRR, no m/r/g. No LE edema.  Respiratory:  CTA bilaterally, no w/r/r. Normal respiratory effort. Abdomen:  soft, ntnd, NABS Skin:  no rash or induration seen on limited exam Musculoskeletal:  grossly normal tone BUE/BLE, good ROM, no bony abnormality Psychiatric:  grossly normal mood and affect, speech fluent and appropriate, AOx3 Neurologic:  CN 2-12 grossly intact, moves all extremities in coordinated fashion, sensation intact  Labs on Admission: I have personally reviewed following labs and imaging studies  CBC:  Recent Labs Lab 04/26/17 2009  WBC 6.7  HGB 9.2*  HCT 28.1*  MCV 87.3  PLT 353   Basic Metabolic Panel:  Recent Labs Lab 04/26/17 2009  NA 138  K 2.7*  CL 113*  CO2 20*   GLUCOSE 89  BUN 9  CREATININE 0.76  CALCIUM 6.4*  MG 1.2*   GFR: CrCl cannot be calculated (Unknown ideal weight.). Liver Function Tests: No results for input(s): AST, ALT, ALKPHOS, BILITOT, PROT, ALBUMIN in the last 168 hours.  Recent Labs Lab 04/26/17 2009  LIPASE 19   No results for input(s): AMMONIA in the last 168 hours. Coagulation Profile:  Recent Labs Lab 04/26/17 2009  INR 1.13   Cardiac Enzymes: No results for input(s): CKTOTAL, CKMB, CKMBINDEX, TROPONINI in the last 168 hours. BNP (last 3 results) No results for input(s): PROBNP in  the last 8760 hours. HbA1C: No results for input(s): HGBA1C in the last 72 hours. CBG:  Recent Labs Lab 04/26/17 2000  GLUCAP 117*   Lipid Profile: No results for input(s): CHOL, HDL, LDLCALC, TRIG, CHOLHDL, LDLDIRECT in the last 72 hours. Thyroid Function Tests:  Recent Labs  04/26/17 2009  TSH 4.193   Anemia Panel: No results for input(s): VITAMINB12, FOLATE, FERRITIN, TIBC, IRON, RETICCTPCT in the last 72 hours. Urine analysis:    Component Value Date/Time   COLORURINE YELLOW 04/26/2017 2153   APPEARANCEUR HAZY (A) 04/26/2017 2153   LABSPEC 1.013 04/26/2017 2153   PHURINE 5.0 04/26/2017 2153   GLUCOSEU NEGATIVE 04/26/2017 2153   HGBUR NEGATIVE 04/26/2017 2153   [place] NEGATIVE 04/26/2017 2153   [place] 04/26/2017 2153   PROTEINUR NEGATIVE 04/26/2017 2153   NITRITE NEGATIVE 04/26/2017 2153   LEUKOCYTESUR LARGE (A) 04/26/2017 2153    Creatinine Clearance: CrCl cannot be calculated (Unknown ideal weight.).  Sepsis Labs: @LABRCNTIP (procalcitonin:4,lacticidven:4) )No results found for this or any previous visit (from the past 240 hour(s)).   Radiological Exams on Admission: Dg Chest 2 View  Result Date: 04/26/2017 CLINICAL DATA:  Near-syncope, hypotension after receiving nitrous oxide at dentist office. Two falls. Slurred speech. History of migraine, hypertension. EXAM: CHEST  2 VIEW  COMPARISON:  Chest radiograph August 05, 2016 FINDINGS: Cardiomediastinal silhouette is normal. No pleural effusions or focal consolidations. Trachea projects midline and there is no pneumothorax. Soft tissue planes and included osseous structures are non-suspicious. ACDF. IMPRESSION: No acute cardiopulmonary process. Electronically Signed   By: Elon Alas M.D.   On: 04/26/2017 21:00    EKG: Independently reviewed.  NSR with rate 61; nonspecific ST changes with no evidence of acute ischemia  Assessment/Plan Principal Problem:   Near syncope Active Problems:   Essential hypertension   Pertinent labs: Lactate 1.69, 0.90 UA - rare bacteria, large LE, +mucous, TNTC WBC - NO symptoms Heme negative Troponin negative K 2.7; 3.8 on 2/12 Calcium 6.4; 9.5 on 2/12 Mag 1.2 Hgb 9.2; 14.3 on 2/12 TSH 4.193  Near syncope -Patient was perfectly fine this AM prior to going to the dentist -No recent medication changes -The only thing the dentist did was start nitrous oxide -Patient with persistent near syncope, blurred vision, new electrolyte derangements, and new anemia -While nitrous has an extremely short half-life, it is possible that there were membrane shifts that led to her symptoms and metabolic derangements -The anemia is somewhat harder to explain but may also be dilutional -For now, will observe overnight on telemetry -IVF hydration -Ionized calcium is pending -Replete electrolytes (although if this is a transient issue then she may actually be overcorrected tomorrow) -Repeat CBC and BMP in AM -Suggest adding nitrous oxide to her allergy list -If her electrolytes have not stabilized by tomorrow, we may need to broaden the differential diagnosis -Check orthostatics at 0700  HTN -Patient continues with borderline hypotension -Hold all BP medications for now   DVT prophylaxis:  Lovenox  Code Status:  Full - confirmed with patient Family Communication: None  present Disposition Plan:  Home once clinically improved Consults called: None  Admission status: It is my clinical opinion that referral for OBSERVATION is reasonable and necessary in this patient based on the above information provided. The aforementioned taken together are felt to place the patient at high risk for further clinical deterioration. However it is anticipated that the patient may be medically stable for discharge from the hospital within 24 to 48 hours.  Karmen Bongo MD [facility]  If 7PM-7AM, please contact night-coverage [email or web address] Password [email or web address]  04/27/2017, 1:00 AM

## 2017-04-27 ENCOUNTER — Observation Stay (HOSPITAL_COMMUNITY): Payer: 59

## 2017-04-27 DIAGNOSIS — I959 Hypotension, unspecified: Secondary | ICD-10-CM

## 2017-04-27 DIAGNOSIS — E669 Obesity, unspecified: Secondary | ICD-10-CM | POA: Diagnosis not present

## 2017-04-27 DIAGNOSIS — K219 Gastro-esophageal reflux disease without esophagitis: Secondary | ICD-10-CM | POA: Diagnosis not present

## 2017-04-27 DIAGNOSIS — Z90710 Acquired absence of both cervix and uterus: Secondary | ICD-10-CM | POA: Diagnosis not present

## 2017-04-27 DIAGNOSIS — R471 Dysarthria and anarthria: Secondary | ICD-10-CM | POA: Diagnosis not present

## 2017-04-27 DIAGNOSIS — Z6836 Body mass index (BMI) 36.0-36.9, adult: Secondary | ICD-10-CM | POA: Diagnosis not present

## 2017-04-27 DIAGNOSIS — E860 Dehydration: Secondary | ICD-10-CM | POA: Diagnosis not present

## 2017-04-27 DIAGNOSIS — R4182 Altered mental status, unspecified: Secondary | ICD-10-CM | POA: Diagnosis not present

## 2017-04-27 DIAGNOSIS — R55 Syncope and collapse: Secondary | ICD-10-CM | POA: Diagnosis not present

## 2017-04-27 DIAGNOSIS — I10 Essential (primary) hypertension: Secondary | ICD-10-CM | POA: Diagnosis present

## 2017-04-27 DIAGNOSIS — R4781 Slurred speech: Secondary | ICD-10-CM | POA: Diagnosis not present

## 2017-04-27 DIAGNOSIS — D649 Anemia, unspecified: Secondary | ICD-10-CM | POA: Diagnosis not present

## 2017-04-27 DIAGNOSIS — D62 Acute posthemorrhagic anemia: Secondary | ICD-10-CM

## 2017-04-27 DIAGNOSIS — H538 Other visual disturbances: Secondary | ICD-10-CM | POA: Diagnosis not present

## 2017-04-27 LAB — BASIC METABOLIC PANEL
Anion gap: 8 (ref 5–15)
BUN: 8 mg/dL (ref 6–20)
CHLORIDE: 111 mmol/L (ref 101–111)
CO2: 23 mmol/L (ref 22–32)
CREATININE: 0.87 mg/dL (ref 0.44–1.00)
Calcium: 8.8 mg/dL — ABNORMAL LOW (ref 8.9–10.3)
GFR calc Af Amer: >60 mL/min (ref >60)
GFR calc non Af Amer: >60 mL/min (ref >60)
Glucose, Bld: 114 mg/dL — ABNORMAL HIGH (ref 65–99)
POTASSIUM: 3.4 mmol/L — AB (ref 3.5–5.1)
SODIUM: 142 mmol/L (ref 135–145)

## 2017-04-27 LAB — CBC
HEMATOCRIT: 39.7 % (ref 36.0–46.0)
Hemoglobin: 12.3 g/dL (ref 12.0–15.0)
MCH: 27.5 pg (ref 26.0–34.0)
MCHC: 31 g/dL (ref 30.0–36.0)
MCV: 88.6 fL (ref 78.0–100.0)
PLATELETS: 194 10*3/uL (ref 150–400)
RBC: 4.48 MIL/uL (ref 3.87–5.11)
RDW: 14.9 % (ref 11.5–15.5)
WBC: 5.8 10*3/uL (ref 4.0–10.5)

## 2017-04-27 LAB — FOLATE: FOLATE: 12.3 ng/mL (ref >5.9)

## 2017-04-27 LAB — IRON AND TIBC
Iron: 54 ug/dL (ref 28–170)
SATURATION RATIOS: 16 % (ref 10.4–31.8)
TIBC: 343 ug/dL (ref 250–450)
UIBC: 289 ug/dL

## 2017-04-27 LAB — TYPE AND SCREEN
ABO/RH(D): O POS
Antibody Screen: NEGATIVE

## 2017-04-27 LAB — HIV ANTIBODY (ROUTINE TESTING W REFLEX): HIV Screen 4th Generation wRfx: NONREACTIVE

## 2017-04-27 LAB — FERRITIN: Ferritin: 74 ng/mL (ref 11–307)

## 2017-04-27 LAB — ABO/RH: ABO/RH(D): O POS

## 2017-04-27 LAB — I-STAT CG4 LACTIC ACID, ED: LACTIC ACID, VENOUS: 0.9 mmol/L (ref 0.5–1.9)

## 2017-04-27 LAB — LACTATE DEHYDROGENASE: LDH: 217 U/L — ABNORMAL HIGH (ref 98–192)

## 2017-04-27 LAB — VITAMIN B12: Vitamin B-12: 235 pg/mL (ref 180–914)

## 2017-04-27 MED ORDER — POTASSIUM CHLORIDE IN NACL 20-0.9 MEQ/L-% IV SOLN
INTRAVENOUS | Status: AC
  Administered 2017-04-27: 22:00:00 via INTRAVENOUS
  Filled 2017-04-27 (×2): qty 1000

## 2017-04-27 MED ORDER — ONDANSETRON HCL 4 MG PO TABS
4.0000 mg | ORAL_TABLET | Freq: Four times a day (QID) | ORAL | Status: DC | PRN
Start: 2017-04-27 — End: 2017-04-28

## 2017-04-27 MED ORDER — BACLOFEN 10 MG PO TABS
10.0000 mg | ORAL_TABLET | Freq: Three times a day (TID) | ORAL | Status: DC
  Administered 2017-04-27 – 2017-04-28 (×2): 10 mg via ORAL
  Filled 2017-04-27 (×3): qty 1

## 2017-04-27 MED ORDER — SODIUM CHLORIDE 0.9% FLUSH
3.0000 mL | Freq: Two times a day (BID) | INTRAVENOUS | Status: DC
  Administered 2017-04-27 (×2): 3 mL via INTRAVENOUS

## 2017-04-27 MED ORDER — ONDANSETRON HCL 4 MG/2ML IJ SOLN: 4.0000 mg | Freq: Four times a day (QID) | INTRAMUSCULAR | Status: DC | PRN

## 2017-04-27 MED ORDER — PANTOPRAZOLE SODIUM 40 MG PO TBEC
40.0000 mg | DELAYED_RELEASE_TABLET | Freq: Every day | ORAL | Status: DC
  Administered 2017-04-27 – 2017-04-28 (×2): 40 mg via ORAL
  Filled 2017-04-27 (×2): qty 1

## 2017-04-27 MED ORDER — VITAMIN B-12 1000 MCG PO TABS
500.0000 ug | ORAL_TABLET | Freq: Every day | ORAL | Status: DC
  Administered 2017-04-27 – 2017-04-28 (×2): 500 ug via ORAL
  Filled 2017-04-27 (×2): qty 1

## 2017-04-27 MED ORDER — AMLODIPINE BESYLATE 5 MG PO TABS
5.0000 mg | ORAL_TABLET | Freq: Every day | ORAL | Status: DC
  Administered 2017-04-27 – 2017-04-28 (×2): 5 mg via ORAL
  Filled 2017-04-27 (×2): qty 1

## 2017-04-27 MED ORDER — TEMAZEPAM 15 MG PO CAPS
30.0000 mg | ORAL_CAPSULE | Freq: Every day | ORAL | Status: DC
Start: 2017-04-27 — End: 2017-04-28
  Administered 2017-04-27 (×2): 30 mg via ORAL
  Filled 2017-04-27 (×2): qty 2

## 2017-04-27 MED ORDER — SODIUM CHLORIDE 0.9 % IV SOLN
INTRAVENOUS | Status: AC
  Administered 2017-04-27: 09:00:00 via INTRAVENOUS
  Filled 2017-04-27 (×2): qty 1000

## 2017-04-27 MED ORDER — LACTATED RINGERS IV SOLN
INTRAVENOUS | Status: DC
  Administered 2017-04-27: 01:00:00 via INTRAVENOUS

## 2017-04-27 MED ORDER — POTASSIUM CHLORIDE CRYS ER 20 MEQ PO TBCR
20.0000 meq | EXTENDED_RELEASE_TABLET | Freq: Once | ORAL | Status: AC
  Administered 2017-04-27: 20 meq via ORAL
  Filled 2017-04-27: qty 1

## 2017-04-27 MED ORDER — CALCIUM GLUCONATE 10 % IV SOLN
2.0000 g | Freq: Once | INTRAVENOUS | Status: AC
  Administered 2017-04-27: 2 g via INTRAVENOUS
  Filled 2017-04-27: qty 20

## 2017-04-27 MED ORDER — ACETAMINOPHEN 325 MG PO TABS
650.0000 mg | ORAL_TABLET | Freq: Four times a day (QID) | ORAL | Status: DC | PRN
  Administered 2017-04-27: 650 mg via ORAL
  Filled 2017-04-27: qty 2

## 2017-04-27 MED ORDER — ACETAMINOPHEN 650 MG RE SUPP: 650.0000 mg | Freq: Four times a day (QID) | RECTAL | Status: DC | PRN

## 2017-04-27 NOTE — Care Management Note (Signed)
Case Management Note  Patient Details  Name: Theresa Henry MRN: [id number] Date of Birth: 1966-03-30  Subjective/Objective:                  52 y.o. female with medical history significant of HTN and migraines presenting with persistent pre-syncope following nitrous oxide administration today at the dentist.  Patient went to the dentist this AM - she chipped a tooth and it was previously filed down and they gave her nitrous oxide today to repair it.  She developed blurry vision, slurred speech, altered mental status.  The dentist turned off the nitrous and ran O2.  She sat up and they gave her apple juice and she felt a bit better and then symptoms recurred.  The dentist called 911.  Her glucose was 150-170.  BP was ok and so they cancelled EMS.  She called her husband to come get her and the symptoms recurred.  Her husband was planning to take her home but when she stood up and tried to walk everything got really blurry and she went down, unable to walk.  Never fully lost consciousness.  911 called again - EKG normal, glucose was 150-170, and they suggested she come to the ER.  She felt completely fine before going to the dentist this AM.  Last food was yesterday AM; she had lots of stress yesterday and she didn't feel like eating.  Also hasn't had that much to drink in the last 36 hours.  Currently a little dizzy but no blurred vision.  Action/Plan:   Expected Discharge Date:  04/28/17               Expected Discharge Plan:  Home/Self Care  In-House Referral:     Discharge planning Services  CM Consult  Post Acute Care Choice:    Choice offered to:     DME Arranged:    DME Agency:     HH Arranged:    HH Agency:     Status of Service:  Completed, signed off  If discussed at [employer] of Stay Meetings, dates discussed:    Additional Comments:  Leeroy Cha, RN 04/27/2017, 10:34 AM

## 2017-04-27 NOTE — Consult Note (Signed)
   [facility] CM Inpatient Consult   04/27/2017  STEFANNIE DEFEO 03/22/1966 [id number]    Came to visit Mrs. Sutcliffe at bedside on behalf of [place] [employer] program for [employer] employees/dependents with [facility] insurance. Explained Link to [employer]. Denies having any  Link to Wellness needs. Provided Link to [employer], contact information, and 24 hr nurse line magnet. Confirmed best contact number as [phone] for post discharge call. Appreciative of visit.  Marthenia Rolling, MSN-Ed, RN,BSN [facility] Liaison [phone]

## 2017-04-27 NOTE — Progress Notes (Signed)
PROGRESS NOTE  Theresa Henry TLX:[id number] DOB: Jun 20, 1966 DOA: 04/26/2017 PCP: Shirline Frees, MD  Brief History:  51 year old female with a history of hypertension, migraine headachespresented after a near syncopal episode at her dentist office. The patient received nitrous oxide for her procedure to fix a chipped tooth. The patient developed blurred vision, slurred speech, and transient confusion with administration of nitrous oxide. The patient improved temporarily after was turned off. CBG was 150-170. However, when the patient was leaving the office, she had another near syncopal episode manifested by "legs giving out", blurred vision, and dizziness. The patient did not lose consciousness. EMS was activated, the patient was brought to the hospital for further evaluation.  Assessment/Plan: Near syncope with dysarthria/Hypotension -Likely multifactorial in the setting of volume depletion and administration of nitrous oxide -Patient was hypotensive in the ED with blood pressure 85/66 -Patient has been taking furosemide daily (new med) for the past 2-3 weeks for lower extremity edema -Discontinue furosemide -Holding antihypertensive medications -Orthostatic vital signs negative -MRI brain  Acute blood loss anemia -01/25/2017 hemoglobin 14.3 -Presenting hemoglobin 9.2 likely spurious--5/15 Hgb 12.3 without transfusion -No obvious signs of blood loss per history -CT abdomen and pelvis to rule out retroperitoneal bleed -LDH and haptoglobin -Check iron/TIBC -Check serum B12 -Check folate  Dehydration -continue IVF with KCL -serum creatinine improved from 1.06 to 0.76  Hypertension -Holding amlodipine and ARB secondary to soft blood pressure  Pyuria -patient is asymptomatic -hold antibiotics -obtain urine culture  Hypokalemia -Repleted -Check magnesium  Disposition Plan:   Home 04/28/17 if stable Family Communication:   Spouse updated at bedside 5/15--Total time  spent 35 minutes.  Greater than 50% spent face to face counseling and coordinating care.   Consultants:  none  Code Status:  FULL   DVT Prophylaxis:  SCDs   Procedures: As Listed in Progress Note Above  Antibiotics: None    Subjective: Patient denies fevers, chills, headache, chest pain, dyspnea, nausea, vomiting, diarrhea, abdominal pain, dysuria, hematuria, hematochezia, and melena.   Objective: Vitals:   04/26/17 2156 04/27/17 0015 04/27/17 0109 04/27/17 0611  BP: 103/70 104/73 128/76 126/81  Pulse: 60 (!) 57 (!) 59 73  Resp: 18 13  17   Temp: 97.7 F (36.5 C)  97.4 F (36.3 C) 97.8 F (36.6 C)  TempSrc: Oral  Axillary Oral  SpO2: 100% 99% 100% 100%  Weight:   107 kg (235 lb 14.3 oz)   Height:   5\' 7"  (1.702 m)     Intake/Output Summary (Last 24 hours) at 04/27/17 0848 Last data filed at 04/27/17 7416  Gross per 24 hour  Intake          1401.66 ml  Output                0 ml  Net          1401.66 ml   Weight change:  Exam:   General:  Pt is alert, follows commands appropriately, not in acute distress  HEENT: No icterus, No thrush, No neck mass, [place]/AT  Cardiovascular: RRR, S1/S2, no rubs, no gallops  Respiratory: CTA bilaterally, no wheezing, no crackles, no rhonchi  Abdomen: Soft/+BS, non tender, non distended, no guarding  Extremities: No edema, No lymphangitis, No petechiae, No rashes, no synovitis   Data Reviewed: I have personally reviewed following labs and imaging studies Basic Metabolic Panel:  Recent Labs Lab 04/26/17 2009 04/27/17 0633  NA 138 142  K 2.7*  3.4*  CL 113* 111  CO2 20* 23  GLUCOSE 89 114*  BUN 9 8  CREATININE 0.76 0.87  CALCIUM 6.4* 8.8*  MG 1.2*  --    Liver Function Tests: No results for input(s): AST, ALT, ALKPHOS, BILITOT, PROT, ALBUMIN in the last 168 hours.  Recent Labs Lab 04/26/17 2009  LIPASE 19   No results for input(s): AMMONIA in the last 168 hours. Coagulation Profile:  Recent Labs Lab  04/26/17 2009  INR 1.13   CBC:  Recent Labs Lab 04/26/17 2009 04/27/17 0633  WBC 6.7 5.8  HGB 9.2* 12.3  HCT 28.1* 39.7  MCV 87.3 88.6  PLT 182 194   Cardiac Enzymes: No results for input(s): CKTOTAL, CKMB, CKMBINDEX, TROPONINI in the last 168 hours. BNP: Invalid input(s): POCBNP CBG:  Recent Labs Lab 04/26/17 2000  GLUCAP 117*   HbA1C: No results for input(s): HGBA1C in the last 72 hours. Urine analysis:    Component Value Date/Time   COLORURINE YELLOW 04/26/2017 2153   APPEARANCEUR HAZY (A) 04/26/2017 2153   LABSPEC 1.013 04/26/2017 2153   PHURINE 5.0 04/26/2017 2153   GLUCOSEU NEGATIVE 04/26/2017 2153   HGBUR NEGATIVE 04/26/2017 2153   [place] NEGATIVE 04/26/2017 2153   [place] 04/26/2017 2153   PROTEINUR NEGATIVE 04/26/2017 2153   NITRITE NEGATIVE 04/26/2017 2153   LEUKOCYTESUR LARGE (A) 04/26/2017 2153   Sepsis Labs: @LABRCNTIP (procalcitonin:4,lacticidven:4) )No results found for this or any previous visit (from the past 240 hour(s)).   Scheduled Meds: . baclofen  10 mg Oral TID  . pantoprazole  40 mg Oral Daily  . sodium chloride flush  3 mL Intravenous Q12H  . temazepam  30 mg Oral QHS   Continuous Infusions: . lactated ringers 125 mL/hr at 04/27/17 0129    Procedures/Studies: Dg Chest 2 View  Result Date: 04/26/2017 CLINICAL DATA:  Near-syncope, hypotension after receiving nitrous oxide at dentist office. Two falls. Slurred speech. History of migraine, hypertension. EXAM: CHEST  2 VIEW COMPARISON:  Chest radiograph August 05, 2016 FINDINGS: Cardiomediastinal silhouette is normal. No pleural effusions or focal consolidations. Trachea projects midline and there is no pneumothorax. Soft tissue planes and included osseous structures are non-suspicious. ACDF. IMPRESSION: No acute cardiopulmonary process. Electronically Signed   By: Elon Alas M.D.   On: 04/26/2017 21:00    Joeline Freer, DO  [facility] Pager  [phone]  If 7PM-7AM, please contact night-coverage [email or web address] Password [email or web address] 04/27/2017, 8:48 AM   LOS: 0 days

## 2017-04-28 DIAGNOSIS — E669 Obesity, unspecified: Secondary | ICD-10-CM | POA: Diagnosis not present

## 2017-04-28 DIAGNOSIS — R4182 Altered mental status, unspecified: Secondary | ICD-10-CM | POA: Diagnosis not present

## 2017-04-28 DIAGNOSIS — I10 Essential (primary) hypertension: Secondary | ICD-10-CM

## 2017-04-28 DIAGNOSIS — R55 Syncope and collapse: Secondary | ICD-10-CM | POA: Diagnosis not present

## 2017-04-28 DIAGNOSIS — E860 Dehydration: Secondary | ICD-10-CM | POA: Diagnosis not present

## 2017-04-28 DIAGNOSIS — R4781 Slurred speech: Secondary | ICD-10-CM | POA: Diagnosis not present

## 2017-04-28 DIAGNOSIS — Z6836 Body mass index (BMI) 36.0-36.9, adult: Secondary | ICD-10-CM | POA: Diagnosis not present

## 2017-04-28 DIAGNOSIS — Z90710 Acquired absence of both cervix and uterus: Secondary | ICD-10-CM | POA: Diagnosis not present

## 2017-04-28 DIAGNOSIS — H538 Other visual disturbances: Secondary | ICD-10-CM | POA: Diagnosis not present

## 2017-04-28 DIAGNOSIS — K219 Gastro-esophageal reflux disease without esophagitis: Secondary | ICD-10-CM | POA: Diagnosis not present

## 2017-04-28 LAB — CBC
HCT: 39.6 % (ref 36.0–46.0)
Hemoglobin: 12.8 g/dL (ref 12.0–15.0)
MCH: 28.1 pg (ref 26.0–34.0)
MCHC: 32.3 g/dL (ref 30.0–36.0)
MCV: 86.8 fL (ref 78.0–100.0)
Platelets: 203 10*3/uL (ref 150–400)
RBC: 4.56 MIL/uL (ref 3.87–5.11)
RDW: 14.8 % (ref 11.5–15.5)
WBC: 6.3 10*3/uL (ref 4.0–10.5)

## 2017-04-28 LAB — URINE CULTURE

## 2017-04-28 LAB — COMPREHENSIVE METABOLIC PANEL
ALBUMIN: 3.6 g/dL (ref 3.5–5.0)
ALK PHOS: 71 U/L (ref 38–126)
ALT: 56 U/L — ABNORMAL HIGH (ref 14–54)
ANION GAP: 10 (ref 5–15)
AST: 38 U/L (ref 15–41)
BILIRUBIN TOTAL: 0.2 mg/dL — AB (ref 0.3–1.2)
BUN: 7 mg/dL (ref 6–20)
CALCIUM: 8.7 mg/dL — AB (ref 8.9–10.3)
CO2: 24 mmol/L (ref 22–32)
Chloride: 105 mmol/L (ref 101–111)
Creatinine, Ser: 0.8 mg/dL (ref 0.44–1.00)
GFR calc Af Amer: >60 mL/min (ref >60)
GLUCOSE: 120 mg/dL — AB (ref 65–99)
Potassium: 3.8 mmol/L (ref 3.5–5.1)
Sodium: 139 mmol/L (ref 135–145)
TOTAL PROTEIN: 6.5 g/dL (ref 6.5–8.1)

## 2017-04-28 LAB — MAGNESIUM: MAGNESIUM: 1.6 mg/dL — AB (ref 1.7–2.4)

## 2017-04-28 LAB — CALCIUM, IONIZED: CALCIUM, IONIZED, SERUM: 4.5 mg/dL (ref 4.5–5.6)

## 2017-04-28 LAB — HAPTOGLOBIN: HAPTOGLOBIN: 145 mg/dL (ref 34–200)

## 2017-04-28 MED ORDER — IRBESARTAN 150 MG PO TABS
300.0000 mg | ORAL_TABLET | Freq: Every day | ORAL | Status: DC
  Administered 2017-04-28: 300 mg via ORAL
  Filled 2017-04-28: qty 2

## 2017-04-28 MED ORDER — FUROSEMIDE 20 MG PO TABS: 20.0000 mg | ORAL_TABLET | Freq: Every day | ORAL | 0 refills | Status: DC | PRN

## 2017-04-28 NOTE — Progress Notes (Signed)
Date: Apr 28, 2017  Discharge orders review for case management needs.  None found  Velva Harman, BSN, [place], [place]:  [phone]

## 2017-04-28 NOTE — Discharge Instructions (Signed)
Follow with Shirline Frees, MD in 1-2 weeks  Please get a complete blood count and chemistry panel checked by your Primary MD at your next visit, and again as instructed by your Primary MD. Please get your medications reviewed and adjusted by your Primary MD.  Please request your Primary MD to go over all Hospital Tests and Procedure/Radiological results at the follow up, please get all Hospital records sent to your Prim MD by signing hospital release before you go home.  If you had Pneumonia of Lung problems at the Hospital: Please get a 2 view Chest X ray done in 6-8 weeks after hospital discharge or sooner if instructed by your Primary MD.  If you have Congestive Heart Failure: Please call your Cardiologist or Primary MD anytime you have any of the following symptoms:  1) 3 pound weight gain in 24 hours or 5 pounds in 1 week  2) shortness of breath, with or without a dry hacking cough  3) swelling in the hands, feet or stomach  4) if you have to sleep on extra pillows at night in order to breathe  Follow cardiac low salt diet and 1.5 lit/day fluid restriction.  If you have diabetes Accuchecks 4 times/day, Once in AM empty stomach and then before each meal. Log in all results and show them to your primary doctor at your next visit. If any glucose reading is under 80 or above 300 call your primary MD immediately.  If you have Seizure/Convulsions/Epilepsy: Please do not drive, operate heavy machinery, participate in activities at heights or participate in high speed sports until you have seen by Primary MD or a Neurologist and advised to do so again.  If you had Gastrointestinal Bleeding: Please ask your Primary MD to check a complete blood count within one week of discharge or at your next visit. Your endoscopic/colonoscopic biopsies that are pending at the time of discharge, will also need to followed by your Primary MD.  Get Medicines reviewed and adjusted. Please take all your  medications with you for your next visit with your Primary MD  Please request your Primary MD to go over all hospital tests and procedure/radiological results at the follow up, please ask your Primary MD to get all Hospital records sent to his/her office.  If you experience worsening of your admission symptoms, develop shortness of breath, life threatening emergency, suicidal or homicidal thoughts you must seek medical attention immediately by calling 911 or calling your MD immediately  if symptoms less severe.  You must read complete instructions/literature along with all the possible adverse reactions/side effects for all the Medicines you take and that have been prescribed to you. Take any new Medicines after you have completely understood and accpet all the possible adverse reactions/side effects.   Do not drive or operate heavy machinery when taking Pain medications.   Do not take more than prescribed Pain, Sleep and Anxiety Medications  Special Instructions: If you have smoked or chewed Tobacco  in the last 2 yrs please stop smoking, stop any regular Alcohol  and or any Recreational drug use.  Wear Seat belts while driving.  Please note You were cared for by a hospitalist during your hospital stay. If you have any questions about your discharge medications or the care you received while you were in the hospital after you are discharged, you can call the unit and asked to speak with the hospitalist on call if the hospitalist that took care of you is not available. Once  you are discharged, your primary care physician will handle any further medical issues. Please note that NO REFILLS for any discharge medications will be authorized once you are discharged, as it is imperative that you return to your primary care physician (or establish a relationship with a primary care physician if you do not have one) for your aftercare needs so that they can reassess your need for medications and monitor your  lab values.  You can reach the hospitalist office at phone [phone] or fax [phone]   If you do not have a primary care physician, you can call [phone] for a physician referral.  Activity: As tolerated with Full fall precautions use walker/cane & assistance as needed  Diet: regular  Disposition Home

## 2017-04-28 NOTE — Discharge Summary (Signed)
Physician Discharge Summary  Theresa Henry WNU:[id number] DOB: January 05, 1966 DOA: 04/26/2017  PCP: Shirline Frees, MD  Admit date: 04/26/2017 Discharge date: 04/28/2017  Admitted From: home Disposition:  home  Recommendations for Outpatient Follow-up:  1. Follow up with PCP in 1-2 weeks  Home Health: none Equipment/Devices: none  Discharge Condition: stable CODE STATUS: Full code Diet recommendation: regular  HPI: Per Dr. Lorin Mercy, Theresa Henry is a 51 y.o. female with medical history significant of HTN and migraines presenting with persistent pre-syncope following nitrous oxide administration today at the dentist.  Patient went to the dentist this AM - she chipped a tooth and it was previously filed down and they gave her nitrous oxide today to repair it.  She developed blurry vision, slurred speech, altered mental status.  The dentist turned off the nitrous and ran O2.  She sat up and they gave her apple juice and she felt a bit better and then symptoms recurred.  The dentist called 911.  Her glucose was 150-170.  BP was ok and so they cancelled EMS.  She called her husband to come get her and the symptoms recurred.  Her husband was planning to take her home but when she stood up and tried to walk everything got really blurry and she went down, unable to walk.  Never fully lost consciousness.  911 called again - EKG normal, glucose was 150-170, and they suggested she come to the ER.  She felt completely fine before going to the dentist this AM.  Last food was yesterday AM; she had lots of stress yesterday and she didn't feel like eating.  Also hasn't had that much to drink in the last 36 hours.  Currently a little dizzy but no blurred vision. ED Course: Marked electrolyte abnormalities (repleting) and decreased Hgb of uncertain etiology.  Hospital Course: Discharge Diagnoses:  Principal Problem:   Near syncope Active Problems:   Essential hypertension   Acute blood loss anemia  Dehydration   Dysarthria   Hypocalcemia   Hypomagnesemia   Hypotension   Near syncope with hypotension -patient was admitted to the hospital with weakness, no syncopal episode.  This was likely believed to be multifactorial in the setting of volume depletion as she was started on Lasix a few weeks ago as well as nitrous oxide in the dentist office.  She was hypotensive in the ED, with hydration her blood pressure improved and was actually on the hypertensive side on discharge, and she will resume her home medications.  MRI of the brain was negative.  EEG was negative. Concern for anemia -on admission her hemoglobin was 9.2, however repeat hemoglobin was within normal limits. Hypertension -she is to resume her amlodipine and ARB on discharge Pyuria -asymptomatic, no need for antibiotics   Discharge Instructions  Discharge Instructions    AMB Referral to [place] [facility]    Complete by:  As directed    Please assign UMR member for post discharge call. Currently at [facility]. Please call with questions.  Marthenia Rolling, MSN-Ed, [facility] Liaison-[phone]   Reason for consult:  Please assign UMR member for post discharge call.   Expected date of contact:  1-3 days (reserved for hospital discharges)     Allergies as of 04/28/2017      Reactions   Diphenhydramine    Makes hyperactive   Sulfa Antibiotics    Sulfamethoxazole-trimethoprim    Other reaction(s): GI Upset (intolerance)   Amoxicillin-pot Clavulanate Rash   Penicillins Rash  Medication List    TAKE these medications   amLODipine 5 MG tablet Commonly known as:  NORVASC Take 5 mg by mouth daily.   baclofen 10 MG tablet Commonly known as:  LIORESAL Take 10 mg by mouth 3 (three) times daily.   butalbital-acetaminophen-caffeine 50-325-40 MG tablet Commonly known as:  FIORICET, ESGIC Take 1 tablet by mouth 2 (two) times daily as needed for headache (headache).   cetirizine 10 MG  tablet Commonly known as:  ZYRTEC Take 10 mg by mouth at bedtime.   CLONAZEPAM PO Take 1 mg by mouth as needed.   esomeprazole 40 MG capsule Commonly known as:  NEXIUM Take 40 mg by mouth daily at 12 noon.   furosemide 20 MG tablet Commonly known as:  LASIX Take 1 tablet (20 mg total) by mouth daily as needed. What changed:  when to take this  reasons to take this   hydrocortisone ointment 0.5 % Apply 1 application topically 2 (two) times daily.   irbesartan 300 MG tablet Commonly known as:  AVAPRO Take 300 mg by mouth daily.   MINIVELLE 0.075 MG/24HR Generic drug:  estradiol Place 1 patch onto the skin 2 (two) times a week.   promethazine 25 MG tablet Commonly known as:  PHENERGAN Take 25 mg by mouth every 6 (six) hours as needed for nausea (nausea).   ranitidine 150 MG tablet Commonly known as:  ZANTAC Take 150 mg by mouth as needed for heartburn.   temazepam 30 MG capsule Commonly known as:  RESTORIL Take 30 mg by mouth at bedtime.   Vitamin E 100 UNIT/GM Crea Apply 1 g topically daily.      Follow-up Information    Shirline Frees, MD. Schedule an appointment as soon as possible for a visit in 2 week(s).   Specialty:  Family Medicine Contact information: [place] [address] [phone]          Allergies  Allergen Reactions  . Diphenhydramine     Makes hyperactive  . Sulfa Antibiotics   . Sulfamethoxazole-Trimethoprim     Other reaction(s): GI Upset (intolerance)  . Amoxicillin-Pot Clavulanate Rash  . Penicillins Rash    Consultations:  None  Procedures/Studies:  Ct Abdomen Pelvis Wo Contrast  Result Date: 04/27/2017 CLINICAL DATA:  Anemia, query retroperitoneal bleed. EXAM: CT ABDOMEN AND PELVIS WITHOUT CONTRAST TECHNIQUE: Multidetector CT imaging of the abdomen and pelvis was performed following the standard protocol without IV contrast. COMPARISON:  Report from CT abdomen dated 09/15/2003 FINDINGS:  Lower chest: Mild cardiomegaly. Airway thickening is present, suggesting bronchitis or reactive airways disease. Hepatobiliary: Diffuse hepatic steatosis. Faint stranding around the somewhat contracted gallbladder. Pancreas: Unremarkable Spleen: Unremarkable Adrenals/Urinary Tract: Unremarkable Stomach/Bowel: Unremarkable Vascular/Lymphatic: Unremarkable Reproductive: Uterus absent.  Adnexa unremarkable. Other: No supplemental non-categorized findings. Musculoskeletal: No findings of retroperitoneal or other hematoma. Mild degenerative arthropathy of both hips. Lumbar spondylosis and degenerative disc disease likely causing mild impingement at the L4-5 level. IMPRESSION: 1. No hematoma or hemorrhage is identified in the abdomen or pelvis. 2. Diffuse hepatic steatosis. 3. Faint stranding around the contracted gallbladder. Probably incidental, but correlate clinically in assessing for cholecystitis. 4. Mild cardiomegaly. 5. Airway thickening is present, suggesting bronchitis or reactive airways disease. 6. Lumbar spondylosis and degenerative disc disease likely causing impingement at L4-5. Electronically Signed   By: Van Clines M.D.   On: 04/27/2017 10:38   Dg Chest 2 View  Result Date: 04/26/2017 CLINICAL DATA:  Near-syncope, hypotension after receiving nitrous oxide  at dentist office. Two falls. Slurred speech. History of migraine, hypertension. EXAM: CHEST  2 VIEW COMPARISON:  Chest radiograph August 05, 2016 FINDINGS: Cardiomediastinal silhouette is normal. No pleural effusions or focal consolidations. Trachea projects midline and there is no pneumothorax. Soft tissue planes and included osseous structures are non-suspicious. ACDF. IMPRESSION: No acute cardiopulmonary process. Electronically Signed   By: Elon Alas M.D.   On: 04/26/2017 21:00   Mr Brain Wo Contrast  Result Date: 04/27/2017 CLINICAL DATA:  Blurry vision, slurred speech and altered mental status. Presyncope after nitrous oxide  administration at dentist today. History of hypertension and migraines. EXAM: MRI HEAD WITHOUT CONTRAST TECHNIQUE: Multiplanar, multiecho pulse sequences of the brain and surrounding structures were obtained without intravenous contrast. COMPARISON:  None. FINDINGS: BRAIN: No reduced diffusion to suggest acute ischemia. No susceptibility artifact to suggest hemorrhage. The ventricles and sulci are normal for patient's age. No suspicious parenchymal signal, masses or mass effect. No abnormal extra-axial fluid collections. VASCULAR: Normal major intracranial vascular flow voids present at skull base. SKULL AND UPPER CERVICAL SPINE: No abnormal sellar expansion. No suspicious calvarial bone marrow signal. Craniocervical junction maintained. SINUSES/ORBITS: The mastoid air-cells and included paranasal sinuses are well-aerated. The included ocular globes and orbital contents are non-suspicious. OTHER: None. IMPRESSION: Normal noncontrast MRI head. Electronically Signed   By: Elon Alas M.D.   On: 04/27/2017 18:05     Subjective: - no chest pain, shortness of breath, no abdominal pain, nausea or vomiting.   Discharge Exam: Vitals:   04/28/17 0517 04/28/17 0904  BP: (!) 149/88 (!) 167/105  Pulse: 74 81  Resp: 19 18  Temp: 98.5 F (36.9 C) 98.5 F (36.9 C)   Vitals:   04/27/17 1642 04/27/17 2125 04/28/17 0517 04/28/17 0904  BP: (!) 157/100 (!) 166/104 (!) 149/88 (!) 167/105  Pulse:  83 74 81  Resp:  20 19 18   Temp:  98.5 F (36.9 C) 98.5 F (36.9 C) 98.5 F (36.9 C)  TempSrc:  Oral Oral Oral  SpO2:  99% 99% 99%  Weight:      Height:        General: Pt is alert, awake, not in acute distress Cardiovascular: RRR, S1/S2 +, no rubs, no gallops Respiratory: CTA bilaterally, no wheezing, no rhonchi Abdominal: Soft, NT, ND, bowel sounds +   The results of significant diagnostics from this hospitalization (including imaging, microbiology, ancillary and laboratory) are listed below for  reference.     Microbiology: Recent Results (from the past 240 hour(s))  Culture, Urine     Status: Abnormal   Collection Time: 04/26/17  9:53 PM  Result Value Ref Range Status   Specimen Description URINE, CLEAN CATCH  Final   Special Requests NONE  Final   Culture (A)  Final    80,000 COLONIES/mL DIPHTHEROIDS(CORYNEBACTERIUM SPECIES) Standardized susceptibility testing for this organism is not available. Performed at [facility], [place] [address] [place], [place] [address]    Report Status 04/28/2017 FINAL  Final     Labs: BNP (last 3 results) No results for input(s): BNP in the last 8760 hours. Basic Metabolic Panel:  Recent Labs Lab 04/26/17 2009 04/27/17 0633 04/28/17 0646  NA 138 142 139  K 2.7* 3.4* 3.8  CL 113* 111 105  CO2 20* 23 24  GLUCOSE 89 114* 120*  BUN 9 8 7   CREATININE 0.76 0.87 0.80  CALCIUM 6.4* 8.8* 8.7*  MG 1.2*  --  1.6*   Liver Function Tests:  Recent  Labs Lab 04/28/17 0646  AST 38  ALT 56*  ALKPHOS 71  BILITOT 0.2*  PROT 6.5  ALBUMIN 3.6    Recent Labs Lab 04/26/17 2009  LIPASE 19   No results for input(s): AMMONIA in the last 168 hours. CBC:  Recent Labs Lab 04/26/17 2009 04/27/17 5974 04/28/17 0646  WBC 6.7 5.8 6.3  HGB 9.2* 12.3 12.8  HCT 28.1* 39.7 39.6  MCV 87.3 88.6 86.8  PLT 182 194 203   Cardiac Enzymes: No results for input(s): CKTOTAL, CKMB, CKMBINDEX, TROPONINI in the last 168 hours. BNP: Invalid input(s): POCBNP CBG:  Recent Labs Lab 04/26/17 2000  GLUCAP 117*   D-Dimer No results for input(s): DDIMER in the last 72 hours. Hgb A1c No results for input(s): HGBA1C in the last 72 hours. Lipid Profile No results for input(s): CHOL, HDL, LDLCALC, TRIG, CHOLHDL, LDLDIRECT in the last 72 hours. Thyroid function studies  Recent Labs  04/26/17 2009  TSH 4.193   Anemia work up  Recent Labs  04/27/17 0940  VITAMINB12 235  FOLATE 12.3  FERRITIN 74  TIBC 343  IRON 54   Urinalysis      Component Value Date/Time   COLORURINE YELLOW 04/26/2017 2153   APPEARANCEUR HAZY (A) 04/26/2017 2153   LABSPEC 1.013 04/26/2017 2153   [place] 5.0 04/26/2017 2153   GLUCOSEU NEGATIVE 04/26/2017 2153   HGBUR NEGATIVE 04/26/2017 2153   [place] 04/26/2017 2153   [place] 04/26/2017 2153   PROTEINUR NEGATIVE 04/26/2017 2153   NITRITE NEGATIVE 04/26/2017 2153   LEUKOCYTESUR LARGE (A) 04/26/2017 2153   Sepsis Labs Invalid input(s): PROCALCITONIN,  WBC,  LACTICIDVEN Microbiology Recent Results (from the past 240 hour(s))  Culture, Urine     Status: Abnormal   Collection Time: 04/26/17  9:53 PM  Result Value Ref Range Status   Specimen Description URINE, CLEAN CATCH  Final   Special Requests NONE  Final   Culture (A)  Final    80,000 COLONIES/mL DIPHTHEROIDS(CORYNEBACTERIUM SPECIES) Standardized susceptibility testing for this organism is not available. Performed at [facility], [place] [address] [place], [place] [address]    Report Status 04/28/2017 FINAL  Final     Time coordinating discharge: 25 minutes  SIGNED:  Marzetta Board, MD  [facility] 04/28/2017, 2:11 PM Pager [phone]  If 7PM-7AM, please contact night-coverage [email or web address] Password [email or web address]

## 2017-04-28 NOTE — Progress Notes (Signed)
RN made aware of patient's BP being high. Patient given medication. MD made aware.

## 2017-04-29 ENCOUNTER — Other Ambulatory Visit: Payer: Self-pay

## 2017-04-29 NOTE — Patient Outreach (Signed)
[place] [facility]) Care Management  04/29/2017  MAYERLY KAMAN [birth year] [id number]   Transition of care telephone call attempt#1.  Member was hospitalized for near syncope and hypotension on 04/26/17 and discharged 04/28/17.  Member has hx of hypertension and migraines. No answer and message left. Plan to attempt call tomorrow if call not returned. Peter Garter RN, [facility] Care Management Coordinator-Link to [place] [facility] [phone]

## 2017-04-30 ENCOUNTER — Other Ambulatory Visit: Payer: Self-pay

## 2017-04-30 NOTE — Patient Outreach (Signed)
[facility] [facility]) Care Management  04/30/2017  LASAUNDRA RICHE 1966-07-19 [id number]   Transition of care telephone call attempt#2.  Member was hospitalized for near syncope and hypotension on 04/26/17 and discharged 04/28/17.  Member has hx of hypertension and migraines. Subjective:  Member states she is back to work today.  States she is feeling better and drinking more fluids.  States she has not been checking her blood pressures at home.  States she does have an automatic cuff.  States she is going to see Dr.Harris on 05/05/17.  States she did talk to Kelli Churn RN earlier today and she scheduled an appt on 6/618 for [facility] to follow for HTN and prediabetes.   Transition of care call completed. Instructed to start checking blood pressure daily and to take log with her to her MD appt.  Reinforced to drink adquate amounts of fluids. Reviewed FMLA process and to file for [facility] as she has coverage.  Member to follow up with [facility] for disease management of HTN and prediabetes on 05/19/17.  No other case management needs identified.   Peter Garter RN, [facility] Care Management Coordinator-Link to [place] [facility] [phone]

## 2017-05-03 ENCOUNTER — Ambulatory Visit
Admission: RE | Admit: 2017-05-03 | Discharge: 2017-05-03 | Disposition: A | Discharge status: Home / Self Care | Payer: 59 | Source: Ambulatory Visit | Attending: Obstetrics and Gynecology | Admitting: Obstetrics and Gynecology

## 2017-05-03 DIAGNOSIS — Z1231 Encounter for screening mammogram for malignant neoplasm of breast: Secondary | ICD-10-CM | POA: Diagnosis not present

## 2017-05-05 DIAGNOSIS — E876 Hypokalemia: Secondary | ICD-10-CM | POA: Diagnosis not present

## 2017-05-05 DIAGNOSIS — R55 Syncope and collapse: Secondary | ICD-10-CM | POA: Diagnosis not present

## 2017-05-05 DIAGNOSIS — I10 Essential (primary) hypertension: Secondary | ICD-10-CM | POA: Diagnosis not present

## 2017-05-05 DIAGNOSIS — Z1211 Encounter for screening for malignant neoplasm of colon: Secondary | ICD-10-CM | POA: Diagnosis not present

## 2017-05-12 ENCOUNTER — Encounter: Payer: Self-pay | Admitting: Family Medicine

## 2017-05-12 MED FILL — ESOMEPRAZOLE MAG DR 40 MG C: 40 | 90 days supply | Qty: 90 | Fill #0

## 2017-05-12 MED FILL — CYCLOBENZAPRINE 10 MG TAB: 10 | 10 days supply | Qty: 30 | Fill #2

## 2017-05-13 MED FILL — OXYCODONE-ACETAMINOPHEN 5-3: 5-325 | 5 days supply | Qty: 20 | Fill #0

## 2017-05-13 MED FILL — IRBESARTAN 300 MG TABLET: 300 | 30 days supply | Qty: 30 | Fill #1

## 2017-05-13 MED FILL — AMLODIPINE BESYLATE 5 MG TA: 5 | 30 days supply | Qty: 30 | Fill #1

## 2017-05-19 ENCOUNTER — Ambulatory Visit: Payer: Self-pay | Admitting: *Deleted

## 2017-05-21 MED FILL — BUTALB-ACETAMIN-CAFF 50-325: 50-325-40 | 7 days supply | Qty: 60 | Fill #3

## 2017-05-21 MED FILL — clonazePAM 1 MG TABS: 1 | 30 days supply | Qty: 60 | Fill #3

## 2017-05-27 DIAGNOSIS — G43009 Migraine without aura, not intractable, without status migrainosus: Secondary | ICD-10-CM | POA: Diagnosis not present

## 2017-05-27 DIAGNOSIS — K219 Gastro-esophageal reflux disease without esophagitis: Secondary | ICD-10-CM | POA: Diagnosis not present

## 2017-05-27 DIAGNOSIS — L308 Other specified dermatitis: Secondary | ICD-10-CM | POA: Diagnosis not present

## 2017-05-27 DIAGNOSIS — F331 Major depressive disorder, recurrent, moderate: Secondary | ICD-10-CM | POA: Diagnosis not present

## 2017-05-27 DIAGNOSIS — Z1321 Encounter for screening for nutritional disorder: Secondary | ICD-10-CM | POA: Diagnosis not present

## 2017-05-27 DIAGNOSIS — R600 Localized edema: Secondary | ICD-10-CM | POA: Diagnosis not present

## 2017-05-27 DIAGNOSIS — Z13220 Encounter for screening for lipoid disorders: Secondary | ICD-10-CM | POA: Diagnosis not present

## 2017-05-27 DIAGNOSIS — F419 Anxiety disorder, unspecified: Secondary | ICD-10-CM | POA: Diagnosis not present

## 2017-05-27 DIAGNOSIS — G4700 Insomnia, unspecified: Secondary | ICD-10-CM | POA: Diagnosis not present

## 2017-05-27 DIAGNOSIS — I10 Essential (primary) hypertension: Secondary | ICD-10-CM | POA: Diagnosis not present

## 2017-05-27 MED FILL — AMLODIPINE BESYLATE 10 MG T: 10 | 30 days supply | Qty: 30 | Fill #0

## 2017-05-27 MED FILL — NAFTIFINE HCL 2% CREAM: 2 | 14 days supply | Qty: 45 | Fill #0

## 2017-05-27 MED FILL — BUPROPION HCL XL 300 MG TAB: 300 | 30 days supply | Qty: 30 | Fill #0

## 2017-06-01 MED FILL — VIT D2 1.25 MG (50,000 UNIT: 1.25 MG | 28 days supply | Qty: 4 | Fill #0

## 2017-06-04 MED FILL — OXYCODONE-ACETAMINOPHEN 5-3: 5-325 | 5 days supply | Qty: 20 | Fill #0

## 2017-06-08 MED FILL — CYCLOBENZAPRINE 10 MG TABLE: 10 | 10 days supply | Qty: 30 | Fill #3

## 2017-06-14 MED FILL — predniSONE 10 MG TABS: 10 | 8 days supply | Qty: 20 | Fill #0

## 2017-06-15 MED FILL — BUTALB-ACETAMIN-CAFF 50-325: 50-325-40 | 7 days supply | Qty: 60 | Fill #0

## 2017-06-17 MED FILL — IRBESARTAN 300 MG TABLET: 300 | 30 days supply | Qty: 30 | Fill #2

## 2017-06-18 DIAGNOSIS — Z760 Encounter for issue of repeat prescription: Secondary | ICD-10-CM | POA: Diagnosis not present

## 2017-06-18 MED FILL — clonazePAM 1 MG TABS: 1 | 30 days supply | Qty: 60 | Fill #0

## 2017-06-18 MED FILL — PROMETHAZINE 25 MG TABLET: 25 | 22 days supply | Qty: 90 | Fill #0

## 2017-06-18 MED FILL — TEMAZEPAM 30 MG CAPSULE: 30 | 90 days supply | Qty: 90 | Fill #0

## 2017-06-28 MED FILL — CYCLOBENZAPRINE 10 MG TABLE: 10 | 10 days supply | Qty: 30 | Fill #0

## 2017-06-28 MED FILL — BUPROPION HCL XL 300 MG TAB: 300 | 30 days supply | Qty: 30 | Fill #1

## 2017-06-28 MED FILL — AMLODIPINE BESYLATE 10 MG T: 10 | 30 days supply | Qty: 30 | Fill #1

## 2017-07-01 MED FILL — BACLOFEN 10 MG TABLET: 10 | 90 days supply | Qty: 270 | Fill #0

## 2017-07-02 MED FILL — OXYCODONE-ACETAMINOPHEN 5-3: 5-325 | 5 days supply | Qty: 20 | Fill #0

## 2017-07-05 DIAGNOSIS — M542 Cervicalgia: Secondary | ICD-10-CM | POA: Diagnosis not present

## 2017-07-05 MED FILL — tiZANidine HCL 4 MG TABS: 4 | 20 days supply | Qty: 60 | Fill #0

## 2017-07-05 MED FILL — MELOXICAM 7.5 MG TABLET: 7.5 | 30 days supply | Qty: 60 | Fill #0

## 2017-07-06 DIAGNOSIS — H524 Presbyopia: Secondary | ICD-10-CM | POA: Diagnosis not present

## 2017-07-06 DIAGNOSIS — H52203 Unspecified astigmatism, bilateral: Secondary | ICD-10-CM | POA: Diagnosis not present

## 2017-07-06 DIAGNOSIS — H5213 Myopia, bilateral: Secondary | ICD-10-CM | POA: Diagnosis not present

## 2017-07-15 MED FILL — PROMETHAZINE 25 MG TABLET: 25 | 22 days supply | Qty: 90 | Fill #1

## 2017-07-15 MED FILL — CYCLOBENZAPRINE 10 MG TABLE: 10 | 10 days supply | Qty: 30 | Fill #1

## 2017-07-15 MED FILL — BUTALB-ACETAMIN-CAFF 50-325: 50-325-40 | 7 days supply | Qty: 60 | Fill #1

## 2017-07-15 MED FILL — clonazePAM 1 MG TABS: 1 | 30 days supply | Qty: 60 | Fill #1

## 2017-07-15 MED FILL — IRBESARTAN 300 MG TABLET: 300 | 30 days supply | Qty: 30 | Fill #3

## 2017-07-20 DIAGNOSIS — S0501XA Injury of conjunctiva and corneal abrasion without foreign body, right eye, initial encounter: Secondary | ICD-10-CM | POA: Diagnosis not present

## 2017-07-21 DIAGNOSIS — S0501XD Injury of conjunctiva and corneal abrasion without foreign body, right eye, subsequent encounter: Secondary | ICD-10-CM | POA: Diagnosis not present

## 2017-07-21 MED FILL — MINIVELLE 0.075 MG PATCH: 0.075 | 28 days supply | Qty: 8 | Fill #1

## 2017-07-27 MED FILL — AMLODIPINE BESYLATE 10 MG T: 10 | 30 days supply | Qty: 30 | Fill #2

## 2017-07-27 MED FILL — buPROPion HCL ER (XL) 300 M: 300 | 30 days supply | Qty: 30 | Fill #2

## 2017-08-02 MED FILL — OXYCODONE-ACETAMINOPHEN 5-3: 5-325 | 5 days supply | Qty: 20 | Fill #0

## 2017-08-03 MED FILL — tiZANidine HCL 4 MG TABS: 4 | 20 days supply | Qty: 60 | Fill #1

## 2017-08-12 MED FILL — IRBESARTAN 300 MG TABLET: 300 | 90 days supply | Qty: 90 | Fill #0

## 2017-08-12 MED FILL — ESOMEPRAZOLE MAG DR 40 MG C: 40 | 90 days supply | Qty: 90 | Fill #0

## 2017-08-13 MED FILL — BUTALB-ACETAMIN-CAFF 50-325: 50-325-40 | 7 days supply | Qty: 60 | Fill #2

## 2017-08-20 MED FILL — MINIVELLE 0.075 MG PATCH: 0.075 | 28 days supply | Qty: 8 | Fill #2

## 2017-08-24 MED FILL — clonazePAM 1 MG TABS: 1 | 30 days supply | Qty: 60 | Fill #2

## 2017-08-26 DIAGNOSIS — E559 Vitamin D deficiency, unspecified: Secondary | ICD-10-CM | POA: Diagnosis not present

## 2017-08-30 MED FILL — BUPROPION HCL XL 300 MG TAB: 300 | 30 days supply | Qty: 30 | Fill #3

## 2017-08-31 MED FILL — VIT D2 1.25 MG (50,000 UNIT: 1.25 MG | 28 days supply | Qty: 4 | Fill #1

## 2017-08-31 MED FILL — OXYCOD/ACETAMINOPHEN 5-325M: 5-325 | 5 days supply | Qty: 20 | Fill #0

## 2017-09-02 ENCOUNTER — Other Ambulatory Visit (HOSPITAL_COMMUNITY): Payer: Self-pay | Admitting: Family Medicine

## 2017-09-02 DIAGNOSIS — M5416 Radiculopathy, lumbar region: Secondary | ICD-10-CM | POA: Diagnosis not present

## 2017-09-02 MED FILL — CYCLOBENZAPRINE 10 MG TABLE: 10 | 10 days supply | Qty: 30 | Fill #2

## 2017-09-02 MED FILL — predniSONE 10 MG TABS: 10 | 12 days supply | Qty: 30 | Fill #0

## 2017-09-03 MED FILL — AMLODIPINE BESYLATE 10 MG T: 10 | 90 days supply | Qty: 90 | Fill #3

## 2017-09-09 ENCOUNTER — Ambulatory Visit (HOSPITAL_COMMUNITY)
Admission: RE | Admit: 2017-09-09 | Discharge: 2017-09-09 | Disposition: A | Discharge status: Home / Self Care | Payer: 59 | Source: Ambulatory Visit | Attending: Family Medicine | Admitting: Family Medicine

## 2017-09-09 DIAGNOSIS — M545 Low back pain: Secondary | ICD-10-CM | POA: Diagnosis not present

## 2017-09-09 DIAGNOSIS — M5126 Other intervertebral disc displacement, lumbar region: Secondary | ICD-10-CM | POA: Diagnosis not present

## 2017-09-09 DIAGNOSIS — M5416 Radiculopathy, lumbar region: Secondary | ICD-10-CM

## 2017-09-09 DIAGNOSIS — M48061 Spinal stenosis, lumbar region without neurogenic claudication: Secondary | ICD-10-CM | POA: Diagnosis not present

## 2017-09-13 MED FILL — BUTALB-ACETAMIN-CAFF 50-325: 50-325-40 | 7 days supply | Qty: 60 | Fill #3

## 2017-09-20 MED FILL — TEMAZEPAM 30 MG CAPSULE: 30 | 90 days supply | Qty: 90 | Fill #1

## 2017-09-20 MED FILL — MINIVELLE 0.075 MG PATCH: 0.075 | 28 days supply | Qty: 8 | Fill #3

## 2017-09-20 MED FILL — tiZANidine HCL 4 MG TABS: 4 | 20 days supply | Qty: 60 | Fill #0

## 2017-09-21 MED FILL — clonazePAM 1 MG TABS: 1 | 30 days supply | Qty: 60 | Fill #3

## 2017-09-27 DIAGNOSIS — R200 Anesthesia of skin: Secondary | ICD-10-CM | POA: Diagnosis not present

## 2017-09-27 DIAGNOSIS — M545 Low back pain: Secondary | ICD-10-CM | POA: Diagnosis not present

## 2017-09-27 DIAGNOSIS — Z6835 Body mass index (BMI) 35.0-35.9, adult: Secondary | ICD-10-CM | POA: Diagnosis not present

## 2017-09-27 DIAGNOSIS — I10 Essential (primary) hypertension: Secondary | ICD-10-CM | POA: Diagnosis not present

## 2017-09-27 DIAGNOSIS — G8929 Other chronic pain: Secondary | ICD-10-CM | POA: Diagnosis not present

## 2017-09-28 DIAGNOSIS — E669 Obesity, unspecified: Secondary | ICD-10-CM | POA: Diagnosis not present

## 2017-09-28 DIAGNOSIS — G43009 Migraine without aura, not intractable, without status migrainosus: Secondary | ICD-10-CM | POA: Diagnosis not present

## 2017-09-28 MED FILL — METOCLOPRAMIDE 10 MG TABLET: 10 | 4 days supply | Qty: 30 | Fill #0

## 2017-10-01 MED FILL — VIT D2 1.25 MG (50,000 UNIT: 1.25 MG | 28 days supply | Qty: 4 | Fill #2

## 2017-10-01 MED FILL — BUPROPION HCL XL 300 MG TAB: 300 | 30 days supply | Qty: 30 | Fill #4

## 2017-10-01 MED FILL — BACLOFEN 10 MG TABS: 10 | 90 days supply | Qty: 270 | Fill #1

## 2017-10-06 MED FILL — OXYCOD/ACETAMINOPHEN 5-325M: 5-325 | 5 days supply | Qty: 20 | Fill #0

## 2017-10-07 DIAGNOSIS — G5602 Carpal tunnel syndrome, left upper limb: Secondary | ICD-10-CM | POA: Diagnosis not present

## 2017-10-15 MED FILL — BUTALB-ACETAMIN-CAFF 50-325: 50-325-40 | 7 days supply | Qty: 60 | Fill #0

## 2017-10-18 DIAGNOSIS — G5602 Carpal tunnel syndrome, left upper limb: Secondary | ICD-10-CM | POA: Diagnosis not present

## 2017-10-18 MED FILL — tiZANidine HCL 4 MG TABS: 4 | 20 days supply | Qty: 60 | Fill #0

## 2017-10-25 MED FILL — METHYLPREDNISOLONE 4 MG TAB: 4 | 6 days supply | Qty: 21 | Fill #0

## 2017-10-25 MED FILL — clonazePAM 1 MG TABS: 1 | 30 days supply | Qty: 60 | Fill #0

## 2017-11-02 DIAGNOSIS — H9201 Otalgia, right ear: Secondary | ICD-10-CM | POA: Diagnosis not present

## 2017-11-02 DIAGNOSIS — G43009 Migraine without aura, not intractable, without status migrainosus: Secondary | ICD-10-CM | POA: Diagnosis not present

## 2017-11-03 MED FILL — MINIVELLE 0.075 MG PATCH: 0.075 | 28 days supply | Qty: 8 | Fill #4

## 2017-11-03 MED FILL — VIT D2 1.25 MG (50,000 UNIT: 1.25 MG | 28 days supply | Qty: 4 | Fill #3

## 2017-11-08 MED FILL — ESOMEPRAZOLE MAG DR 40 MG C: 40 | 90 days supply | Qty: 90 | Fill #0

## 2017-11-08 MED FILL — IRBESARTAN 300 MG TAB: 300 | 90 days supply | Qty: 90 | Fill #0

## 2017-11-08 MED FILL — BUPROPION HCL XL 300 MG TAB: 300 | 30 days supply | Qty: 30 | Fill #5

## 2017-11-12 MED FILL — BUTALB-ACETAMIN-CAFF 50-325: 50-325-40 | 7 days supply | Qty: 60 | Fill #1

## 2017-11-18 DIAGNOSIS — G5602 Carpal tunnel syndrome, left upper limb: Secondary | ICD-10-CM | POA: Diagnosis not present

## 2017-11-18 MED FILL — HYDROCODON-APAP 5-325: 5-325 | 5 days supply | Qty: 20 | Fill #0

## 2017-11-19 MED FILL — OXYCOD/ACETAMINOPHEN 5-325M: 5-325 | 7 days supply | Qty: 30 | Fill #0

## 2017-11-24 DIAGNOSIS — H93292 Other abnormal auditory perceptions, left ear: Secondary | ICD-10-CM | POA: Insufficient documentation

## 2017-11-24 DIAGNOSIS — H9312 Tinnitus, left ear: Secondary | ICD-10-CM | POA: Diagnosis not present

## 2017-11-24 DIAGNOSIS — H9042 Sensorineural hearing loss, unilateral, left ear, with unrestricted hearing on the contralateral side: Secondary | ICD-10-CM | POA: Diagnosis not present

## 2017-11-24 DIAGNOSIS — H903 Sensorineural hearing loss, bilateral: Secondary | ICD-10-CM | POA: Diagnosis not present

## 2017-11-24 DIAGNOSIS — H9122 Sudden idiopathic hearing loss, left ear: Secondary | ICD-10-CM | POA: Insufficient documentation

## 2017-11-24 MED FILL — clonazePAM 1 MG TABS: 1 | 30 days supply | Qty: 60 | Fill #1

## 2017-11-24 MED FILL — predniSONE 10 MG TABS: 10 | 17 days supply | Qty: 72 | Fill #0

## 2017-11-29 ENCOUNTER — Encounter: Payer: Self-pay | Admitting: Gastroenterology

## 2017-11-29 DIAGNOSIS — Z1211 Encounter for screening for malignant neoplasm of colon: Secondary | ICD-10-CM | POA: Diagnosis not present

## 2017-11-29 DIAGNOSIS — F331 Major depressive disorder, recurrent, moderate: Secondary | ICD-10-CM | POA: Diagnosis not present

## 2017-11-29 DIAGNOSIS — G43009 Migraine without aura, not intractable, without status migrainosus: Secondary | ICD-10-CM | POA: Diagnosis not present

## 2017-11-29 DIAGNOSIS — E559 Vitamin D deficiency, unspecified: Secondary | ICD-10-CM | POA: Diagnosis not present

## 2017-11-29 DIAGNOSIS — G4700 Insomnia, unspecified: Secondary | ICD-10-CM | POA: Diagnosis not present

## 2017-11-29 DIAGNOSIS — K219 Gastro-esophageal reflux disease without esophagitis: Secondary | ICD-10-CM | POA: Diagnosis not present

## 2017-11-29 DIAGNOSIS — F419 Anxiety disorder, unspecified: Secondary | ICD-10-CM | POA: Diagnosis not present

## 2017-11-29 DIAGNOSIS — I10 Essential (primary) hypertension: Secondary | ICD-10-CM | POA: Diagnosis not present

## 2017-11-29 MED FILL — tiZANidine HCL 4 MG TABS: 4 | 10 days supply | Qty: 30 | Fill #0

## 2017-11-29 MED FILL — CloNIDine HCL 0.1 MG TAB: 0.1 | 30 days supply | Qty: 60 | Fill #0

## 2017-12-03 MED FILL — BUPROPION HCL XL 300 MG TAB: 300 | 30 days supply | Qty: 30 | Fill #6

## 2017-12-03 MED FILL — PROMETHAZINE 25 MG TABLET: 25 | 22 days supply | Qty: 90 | Fill #0

## 2017-12-10 MED FILL — BUTALB-ACETAMIN-CAFF 50-325: 50-325-40 | 7 days supply | Qty: 60 | Fill #2

## 2017-12-15 DIAGNOSIS — H9122 Sudden idiopathic hearing loss, left ear: Secondary | ICD-10-CM | POA: Diagnosis not present

## 2017-12-15 DIAGNOSIS — H9042 Sensorineural hearing loss, unilateral, left ear, with unrestricted hearing on the contralateral side: Secondary | ICD-10-CM | POA: Diagnosis not present

## 2017-12-15 DIAGNOSIS — H9312 Tinnitus, left ear: Secondary | ICD-10-CM | POA: Diagnosis not present

## 2017-12-15 MED FILL — tiZANidine HCL 4 MG TABS: 4 | 10 days supply | Qty: 30 | Fill #1

## 2017-12-15 MED FILL — predniSONE 10 MG TABS: 10 | 17 days supply | Qty: 72 | Fill #0

## 2017-12-17 MED FILL — TEMAZEPAM 30 MG CAPSULE: 30 | 90 days supply | Qty: 90 | Fill #0

## 2017-12-23 MED FILL — OXYCOD/ACETAMINOPHEN 5-325M: 5-325 | 5 days supply | Qty: 20 | Fill #0

## 2017-12-24 MED FILL — tiZANidine HCL 4 MG TABS: 4 | 10 days supply | Qty: 30 | Fill #2

## 2017-12-24 MED FILL — BACLOFEN 10 MG TABS: 10 | 90 days supply | Qty: 270 | Fill #0

## 2017-12-24 MED FILL — clonazePAM 1 MG TABS: 1 | 30 days supply | Qty: 60 | Fill #2

## 2017-12-29 DIAGNOSIS — H9122 Sudden idiopathic hearing loss, left ear: Secondary | ICD-10-CM | POA: Diagnosis not present

## 2017-12-29 DIAGNOSIS — H9312 Tinnitus, left ear: Secondary | ICD-10-CM | POA: Diagnosis not present

## 2017-12-29 DIAGNOSIS — H9192 Unspecified hearing loss, left ear: Secondary | ICD-10-CM | POA: Diagnosis not present

## 2018-01-07 ENCOUNTER — Ambulatory Visit (AMBULATORY_SURGERY_CENTER): Payer: Self-pay

## 2018-01-07 VITALS — Ht 67.0 in | Wt 238.4 lb

## 2018-01-07 DIAGNOSIS — Z1211 Encounter for screening for malignant neoplasm of colon: Secondary | ICD-10-CM

## 2018-01-07 MED ORDER — NA SULFATE-K SULFATE-MG SULF 17.5-3.13-1.6 GM/177ML PO SOLN: 1.0000 | Freq: Once | ORAL | 0 refills | Status: AC

## 2018-01-07 MED FILL — SUPREP BOWEL PREP KIT: 17.5-3.13-1 | 1 days supply | Qty: 354 | Fill #0

## 2018-01-07 NOTE — Progress Notes (Signed)
Per pt, no allergies to soy or egg products.Pt not taking any weight loss meds or using  O2 at home.  Pt refused emmi video. 

## 2018-01-10 ENCOUNTER — Encounter: Payer: Self-pay | Admitting: Gastroenterology

## 2018-01-10 MED FILL — CloNIDine HCL 0.1 MG TAB: 0.1 | 30 days supply | Qty: 60 | Fill #1

## 2018-01-10 MED FILL — tiZANidine HCL 4 MG TABS: 4 | 10 days supply | Qty: 30 | Fill #3

## 2018-01-10 MED FILL — BUPROPION HCL XL 300 MG TAB: 300 | 30 days supply | Qty: 30 | Fill #0

## 2018-01-10 MED FILL — BUTALB-ACETAMIN-CAFF 50-325: 50-325-40 | 7 days supply | Qty: 60 | Fill #3

## 2018-01-11 MED FILL — OSELTAMIVIR PHOSPHATE 75 MG: 75 | 10 days supply | Qty: 10 | Fill #0

## 2018-01-21 ENCOUNTER — Ambulatory Visit (AMBULATORY_SURGERY_CENTER): Payer: 59 | Admitting: Gastroenterology

## 2018-01-21 ENCOUNTER — Other Ambulatory Visit: Payer: Self-pay

## 2018-01-21 ENCOUNTER — Encounter: Payer: Self-pay | Admitting: Gastroenterology

## 2018-01-21 VITALS — BP 156/105 | HR 73 | Temp 99.1°F | Resp 20 | Ht 67.0 in | Wt 224.0 lb

## 2018-01-21 DIAGNOSIS — Z800 Family history of malignant neoplasm of digestive organs: Secondary | ICD-10-CM

## 2018-01-21 DIAGNOSIS — D649 Anemia, unspecified: Secondary | ICD-10-CM | POA: Diagnosis not present

## 2018-01-21 DIAGNOSIS — D124 Benign neoplasm of descending colon: Secondary | ICD-10-CM | POA: Diagnosis not present

## 2018-01-21 DIAGNOSIS — Z1211 Encounter for screening for malignant neoplasm of colon: Secondary | ICD-10-CM

## 2018-01-21 DIAGNOSIS — I10 Essential (primary) hypertension: Secondary | ICD-10-CM | POA: Diagnosis not present

## 2018-01-21 DIAGNOSIS — D128 Benign neoplasm of rectum: Secondary | ICD-10-CM | POA: Diagnosis not present

## 2018-01-21 MED ORDER — SODIUM CHLORIDE 0.9 % IV SOLN: 500.0000 mL | Freq: Once | INTRAVENOUS | Status: DC

## 2018-01-21 NOTE — Progress Notes (Signed)
DISCUSSED WITH PATIENT MONITORING HER BP AT HOME WITH DOCUMENTATION. PATIENT STATING RECENT CHANGE IN BP MEDS. PATIENT AGREED WITH PLAN.

## 2018-01-21 NOTE — Patient Instructions (Signed)
Handout given : pOLYPS.  YOU HAD AN ENDOSCOPIC PROCEDURE TODAY AT [facility]:   Refer to the procedure report that was given to you for any specific questions about what was found during the examination.  If the procedure report does not answer your questions, please call your gastroenterologist to clarify.  If you requested that your care partner not be given the details of your procedure findings, then the procedure report has been included in a sealed envelope for you to review at your convenience later.  YOU SHOULD EXPECT: Some feelings of bloating in the abdomen. Passage of more gas than usual.  Walking can help get rid of the air that was put into your GI tract during the procedure and reduce the bloating. If you had a lower endoscopy (such as a colonoscopy or flexible sigmoidoscopy) you may notice spotting of blood in your stool or on the toilet paper. If you underwent a bowel prep for your procedure, you may not have a normal bowel movement for a few days.  Please Note:  You might notice some irritation and congestion in your nose or some drainage.  This is from the oxygen used during your procedure.  There is no need for concern and it should clear up in a day or so.  SYMPTOMS TO REPORT IMMEDIATELY:   Following lower endoscopy (colonoscopy or flexible sigmoidoscopy):  Excessive amounts of blood in the stool  Significant tenderness or worsening of abdominal pains  Swelling of the abdomen that is new, acute  Fever of 100F or higher   For urgent or emergent issues, a gastroenterologist can be reached at any hour by calling [phone].   DIET:  We do recommend a small meal at first, but then you may proceed to your regular diet.  Drink plenty of fluids but you should avoid alcoholic beverages for 24 hours.  ACTIVITY:  You should plan to take it easy for the rest of today and you should NOT DRIVE or use heavy machinery until tomorrow (because of the sedation  medicines used during the test).    FOLLOW UP: Our staff will call the number listed on your records the next business day following your procedure to check on you and address any questions or concerns that you may have regarding the information given to you following your procedure. If we do not reach you, we will leave a message.  However, if you are feeling well and you are not experiencing any problems, there is no need to return our call.  We will assume that you have returned to your regular daily activities without incident.  If any biopsies were taken you will be contacted by phone or by letter within the next 1-3 weeks.  Please call us at [phone] if you have not heard about the biopsies in 3 weeks.    SIGNATURES/CONFIDENTIALITY: You and/or your care partner have signed paperwork which will be entered into your electronic medical record.  These signatures attest to the fact that that the information above on your After Visit Summary has been reviewed and is understood.  Full responsibility of the confidentiality of this discharge information lies with you and/or your care-partner.

## 2018-01-21 NOTE — Progress Notes (Signed)
Called to room to assist during endoscopic procedure.  Patient ID and intended procedure confirmed with present staff. Received instructions for my participation in the procedure from the performing physician.  

## 2018-01-21 NOTE — Op Note (Addendum)
[facility] Patient Name: Theresa Henry Procedure Date: 01/21/2018 10:09 AM MRN: [id number] Endoscopist: Ladene Artist , MD Age: 52 Referring MD:  Date of Birth: February 28, 1966 Gender: Female Account #: [id number] Procedure:                Colonoscopy Indications:              Colon cancer screening in patient at increased                            risk: Family history of colorectal cancer in                            multiple 2nd degree relatives (paternal aunt and                            paternal uncle) Medicines:                Monitored Anesthesia Care Procedure:                Pre-Anesthesia Assessment:                           - Prior to the procedure, a History and Physical                            was performed, and patient medications and                            allergies were reviewed. The patient's tolerance of                            previous anesthesia was also reviewed. The risks                            and benefits of the procedure and the sedation                            options and risks were discussed with the patient.                            All questions were answered, and informed consent                            was obtained. Prior Anticoagulants: The patient has                            taken no previous anticoagulant or antiplatelet                            agents. ASA Grade Assessment: II - A patient with                            mild systemic disease. After reviewing the risks  and benefits, the patient was deemed in                            satisfactory condition to undergo the procedure.                           After obtaining informed consent, the colonoscope                            was passed under direct vision. Throughout the                            procedure, the patient's blood pressure, pulse, and                            oxygen saturations were monitored continuously. The                            Model PCF-[id number]) scope was introduced                            through the anus and advanced to the the cecum,                            identified by appendiceal orifice and ileocecal                            valve. The ileocecal valve, appendiceal orifice,                            and rectum were photographed. The quality of the                            bowel preparation was excellent. The colonoscopy                            was performed without difficulty. The patient                            tolerated the procedure well. Scope In: 10:18:35 AM Scope Out: 10:33:34 AM Scope Withdrawal Time: 0 hours 13 minutes 14 seconds  Total Procedure Duration: 0 hours 14 minutes 59 seconds  Findings:                 The perianal and digital rectal examinations were                            normal.                           A single small localized angiodysplastic lesion                            without bleeding was found in the ascending colon.  Two sessile polyps were found in the rectum and                            descending colon. The polyps were 5 to 8 mm in                            size. These polyps were removed with a cold snare.                            Resection and retrieval were complete.                           The exam was otherwise without abnormality on                            direct and retroflexion views. Complications:            No immediate complications. Estimated blood loss:                            None. Estimated Blood Loss:     Estimated blood loss: none. Impression:               - Two 5 to 8 mm polyps in the rectum and in the                            descending colon, removed with a cold snare.                            Resected and retrieved.                           - Small, nonbleeding angiodysplasia in the                            ascending colon                            - The examination was otherwise normal on direct                            and retroflexion views. Recommendation:           - Repeat colonoscopy in 5 years.                           - Patient has a contact number available for                            emergencies. The signs and symptoms of potential                            delayed complications were discussed with the                            patient. Return  to normal activities tomorrow.                            Written discharge instructions were provided to the                            patient.                           - Resume previous diet.                           - Continue present medications.                           - Await pathology results. Ladene Artist, MD 01/21/2018 10:37:14 AM This report has been signed electronically.

## 2018-01-21 NOTE — Progress Notes (Signed)
Pt's states no medical or surgical changes since previsit or office visit. 

## 2018-01-21 NOTE — Progress Notes (Signed)
  [facility] Post-op Note  Patient: Theresa Henry  Procedure(s) Performed: colonoscopy  Patient Location: [facility] - Recovery Area  Anesthesia Type: Deep Sedation/Propofol  Level of Consciousness: awake, oriented and patient cooperative  Airway and Oxygen Therapy: Patient Spontanous Breathing  Post-op Pain: none  Post-op Assessment:  Post-op Vital signs reviewed, Patient's Cardiovascular Status Stable, Respiratory Function Stable, Patent Airway, No signs of Nausea or vomiting and Pain level controlled  Post-op Vital Signs: Reviewed and stable  Complications: No apparent anesthesia complications  Carson Bogden E Majel Giel 10:40 AM

## 2018-01-24 ENCOUNTER — Telehealth: Payer: Self-pay

## 2018-01-24 NOTE — Telephone Encounter (Signed)
  Follow up Call-  Call back number 01/21/2018  Post procedure Call Back phone  # [phone]  Permission to leave phone message Yes  Some recent data might be hidden     Patient questions:  Do you have a fever, pain , or abdominal swelling? No. Pain Score  0 *  Have you tolerated food without any problems? Yes.    Have you been able to return to your normal activities? Yes.    Do you have any questions about your discharge instructions: Diet   No. Medications  No. Follow up visit  No.  Do you have questions or concerns about your Care? No.  Actions: * If pain score is 4 or above: No action needed, pain <4.

## 2018-01-26 DIAGNOSIS — S0501XA Injury of conjunctiva and corneal abrasion without foreign body, right eye, initial encounter: Secondary | ICD-10-CM | POA: Diagnosis not present

## 2018-01-26 MED FILL — TOBRAMYCIN-DEXAMETH OPTH SU: 0.3-0.1 | 30 days supply | Qty: 5 | Fill #0

## 2018-01-26 MED FILL — clonazePAM 1 MG TABS: 1 | 30 days supply | Qty: 60 | Fill #3

## 2018-01-27 DIAGNOSIS — S0501XS Injury of conjunctiva and corneal abrasion without foreign body, right eye, sequela: Secondary | ICD-10-CM | POA: Diagnosis not present

## 2018-01-27 DIAGNOSIS — S0501XD Injury of conjunctiva and corneal abrasion without foreign body, right eye, subsequent encounter: Secondary | ICD-10-CM | POA: Diagnosis not present

## 2018-02-03 ENCOUNTER — Encounter: Payer: Self-pay | Admitting: Gastroenterology

## 2018-02-04 MED FILL — AMLODIPINE BESYLATE 10 MG T: 10 | 30 days supply | Qty: 30 | Fill #4

## 2018-02-09 MED FILL — BUTALB-ACETAMIN-CAFF 50-325: 50-325-40 | 7 days supply | Qty: 60 | Fill #0

## 2018-02-18 MED FILL — PROMETHAZINE 25 MG TABLET: 25 | 22 days supply | Qty: 90 | Fill #0

## 2018-02-18 MED FILL — tiZANidine HCL 4 MG TABS: 4 | 10 days supply | Qty: 30 | Fill #0

## 2018-02-25 MED FILL — clonazePAM 1 MG TABS: 1 | 30 days supply | Qty: 60 | Fill #0

## 2018-02-25 MED FILL — CloNIDine HCL 0.1 MG TAB: 0.1 | 30 days supply | Qty: 60 | Fill #2

## 2018-02-27 IMAGING — MR MR CERVICAL SPINE W/O CM
4 of 5 series · 18 of 48 positions shown · IV contrast (Yes)
Comparison: 10/17/2014

CLINICAL DATA: Headaches. Neck pain affecting the right arm and
hand, worsening over the last year.

EXAM:
MRI CERVICAL SPINE WITHOUT CONTRAST
TECHNIQUE: Multiplanar, multisequence MR imaging of the cervical spine was
performed. No intravenous contrast was administered.

[Series 2: T1 · sagittal · 3.0mm · 0.41mm/px · 3 of 13 slices shown]
[im 3/13]
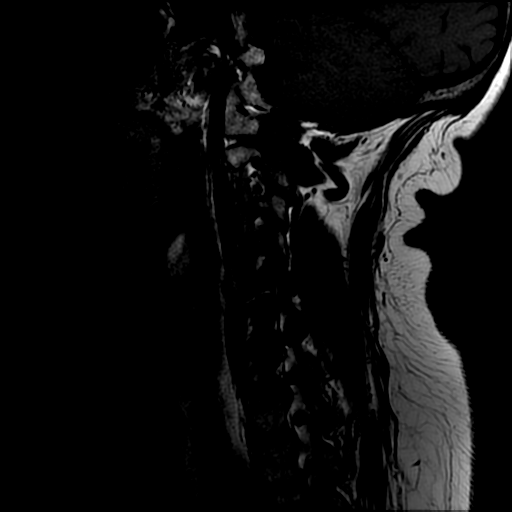
[im 7/13]
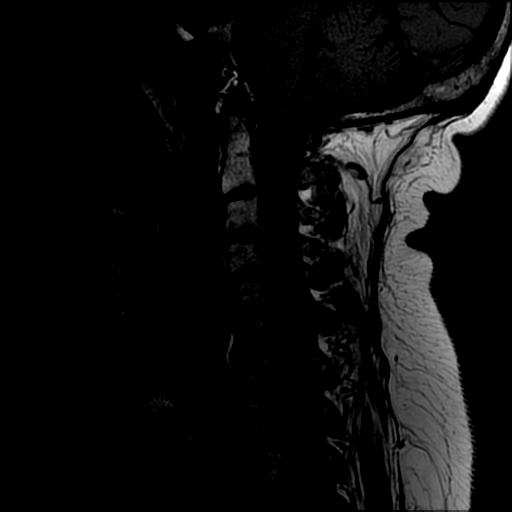
[im 11/13]
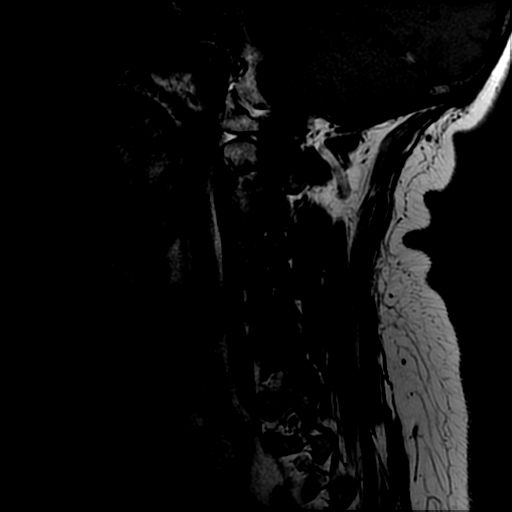

[Series 3: sag ir · sagittal · 3.0mm · 0.41mm/px · 3 of 13 slices shown]
[im 3/13]
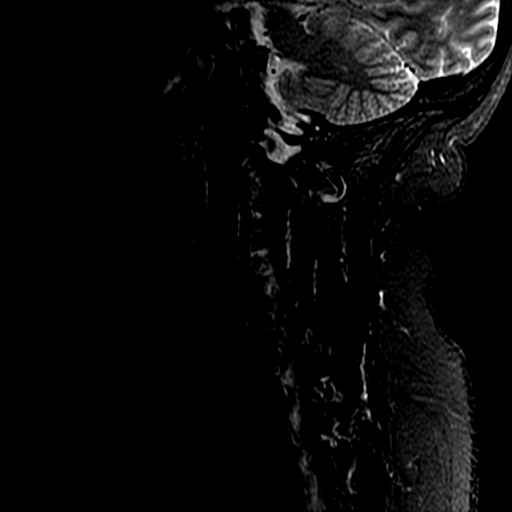
[im 7/13]
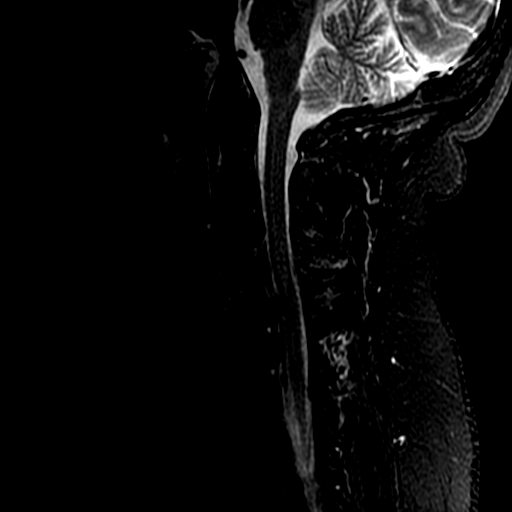
[im 11/13]
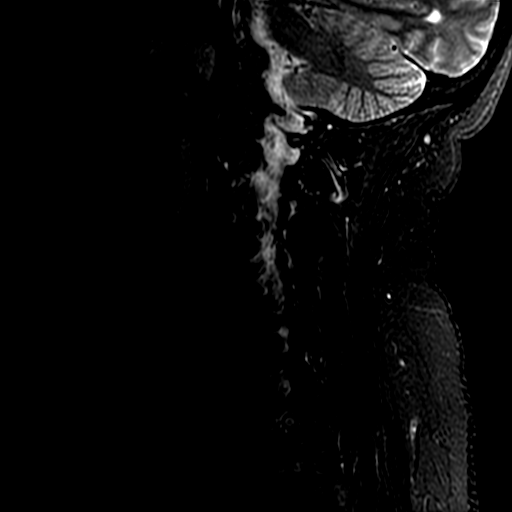

[Series 4: T2 post-contrast · sagittal · 3.0mm · 0.41mm/px · 6 of 13 slices shown]
[im 1/13]
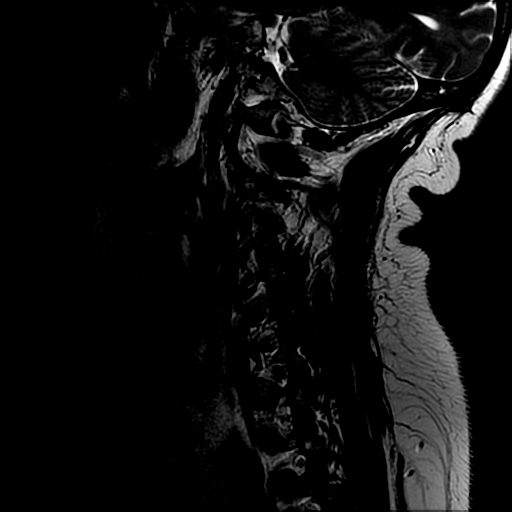
[im 3/13]
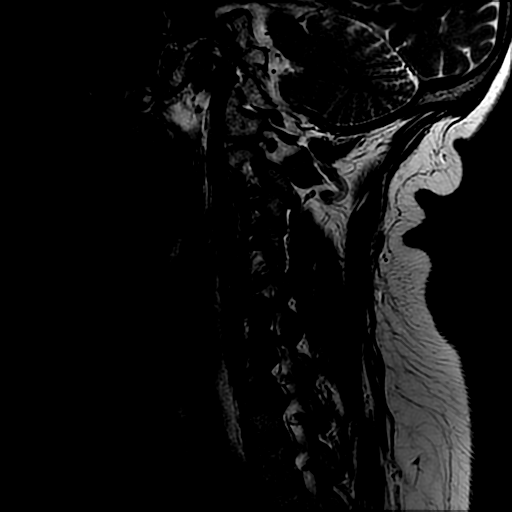
[im 5/13]
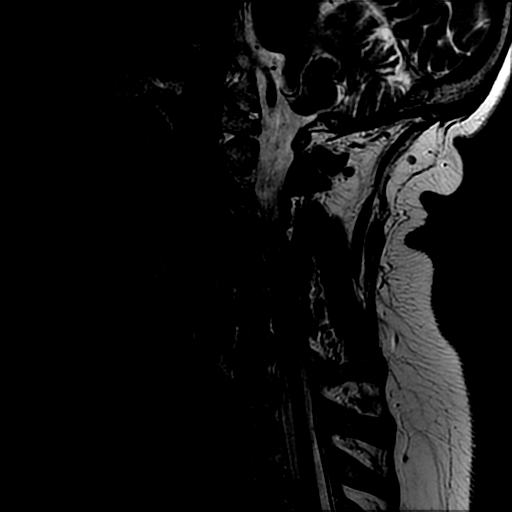
[im 8/13]
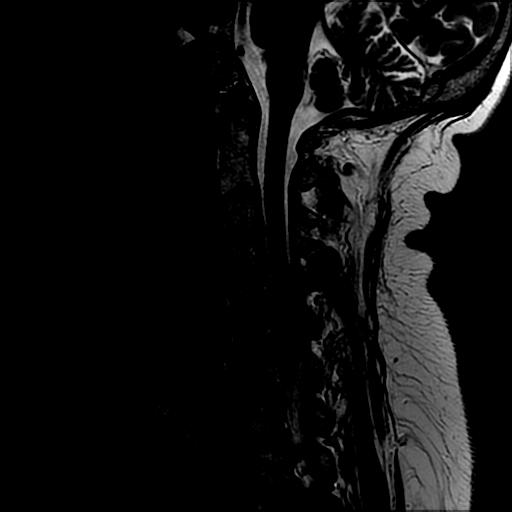
[im 10/13]
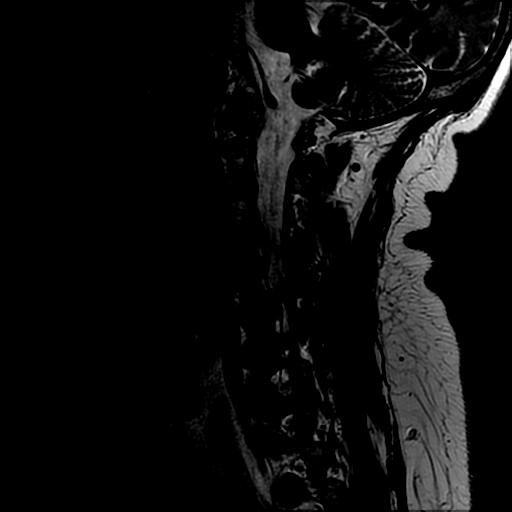
[im 13/13]
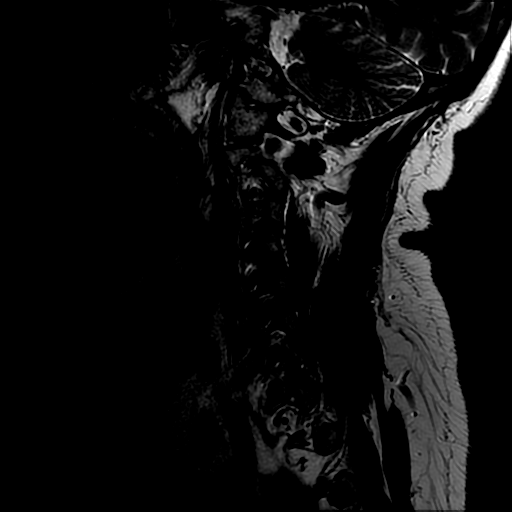

[Series 6: T2 · axial · 3.1mm · 0.35mm/px · z∈[-45,+35]mm · 6 of 29 slices shown]
[im 1/29]
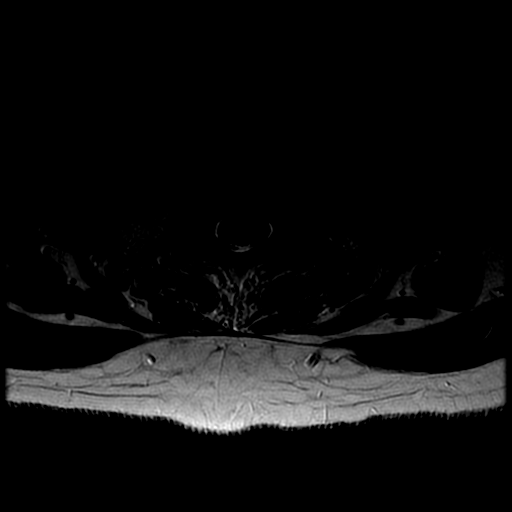
[im 5/29]
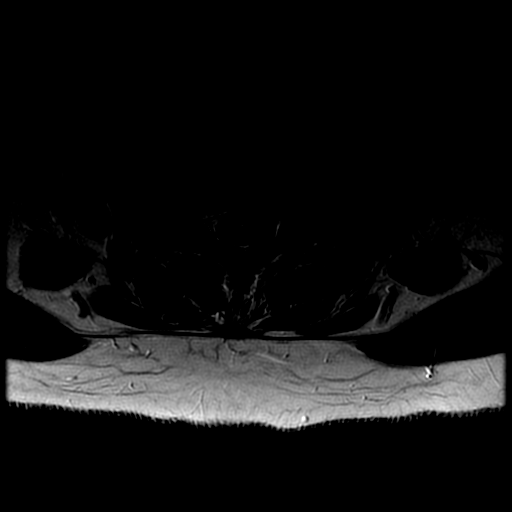
[im 9/29]
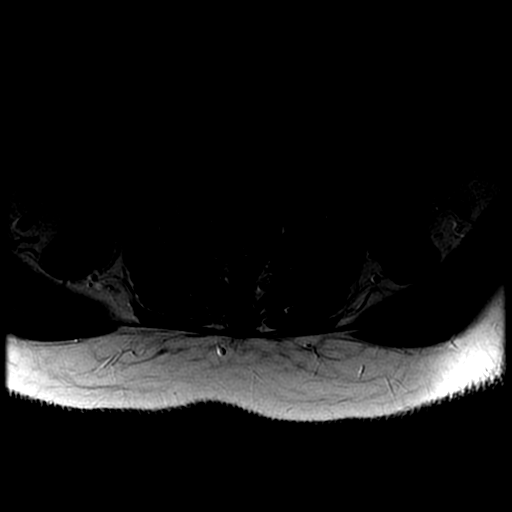
[im 13/29]
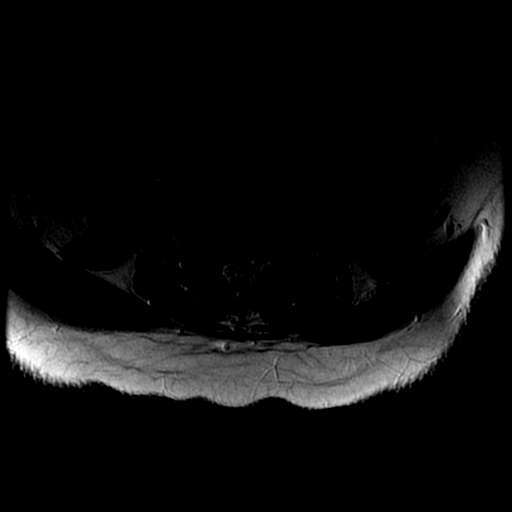
[im 16/29]
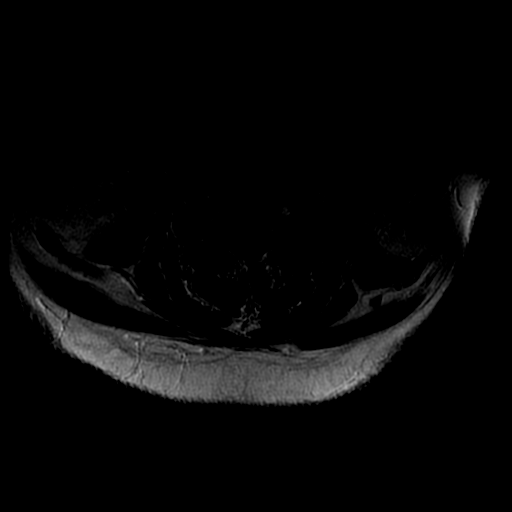
[im 24/29]
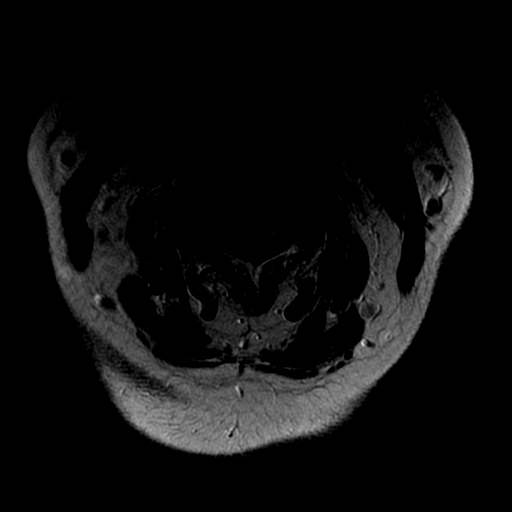

[18 of 48 positions shown; findings below may reference images not displayed]

FINDINGS: Alignment: Normal

Vertebrae: No fracture or primary bone lesion.

Cord: No primary cord lesion. Cord deformity on the left at C5-6 as
described below.

Posterior Fossa, vertebral arteries, paraspinal tissues: Negative

Disc levels:

Foramen magnum is widely patent. Mild osteoarthritis at C1-2 without
stenosis. C2-3 is normal.

C3-4: Spondylosis with endplate osteophytes and bulging disc
material more prominent towards the right. No compressive central
canal stenosis. Right foraminal narrowing without visible
compression of the exiting C4 nerve root.

C4-5: Shallow protrusion of the disc. Narrowing of the ventral
subarachnoid space but no compression of the cord. Mild foraminal
encroachment bilaterally without visible C5 nerve compression.

C5-6: Large left posterior lateral disc herniation at C5-6
flattening the left side of the cord. Foraminal stenosis on the left
likely to compress the left C6 nerve root. Mild foraminal narrowing
on the right.

C6-7: Spondylosis with endplate osteophytes and protruding disc
material. Narrowing of the ventral subarachnoid space but no
compression of the cord. Foraminal encroachment bilaterally that
could affect either or both C7 nerve roots, worse on the left.

C7-T1:  Normal interspace.

When compared to the previous study, the left-sided disc herniation
at C5-6 is slightly larger. Other findings appear quite similar.
IMPRESSION: Slight enlargement of a large left posterior lateral disc herniation
at C5-6. Flattening of the left side of the cord. Foraminal stenosis
left more than right.

Spondylosis at C3-4 slightly more pronounced on the right. Foraminal
narrowing but without definite neural compression.

Shallow disc protrusion at C4-5 without visible neural compression.

Spondylosis and disc herniation at C6-7. Effacement of the ventral
subarachnoid space. Foraminal encroachment bilaterally that could
affect either C7 nerve root.

## 2018-02-28 MED FILL — IRBESARTAN 300 MG TABLET: 300 | 90 days supply | Qty: 90 | Fill #0

## 2018-02-28 MED FILL — tiZANidine HCL 4 MG TABS: 4 | 10 days supply | Qty: 30 | Fill #1

## 2018-02-28 MED FILL — BUPROPION HCL XL 300 MG TAB: 300 | 30 days supply | Qty: 30 | Fill #1

## 2018-02-28 MED FILL — AMLODIPINE BESYLATE 10 MG T: 10 | 30 days supply | Qty: 30 | Fill #0

## 2018-02-28 MED FILL — ESOMEPRAZOLE MAG DR 40 MG C: 40 | 90 days supply | Qty: 90 | Fill #0

## 2018-03-02 MED FILL — OXYCOD/ACETAMINOPHEN 5-325M: 5-325 | 5 days supply | Qty: 20 | Fill #0

## 2018-03-14 MED FILL — BUTALB-ACETAMIN-CAFF 50-325: 50-325-40 | 30 days supply | Qty: 60 | Fill #1

## 2018-03-17 MED FILL — tiZANidine HCL 4 MG TABS: 4 | 10 days supply | Qty: 30 | Fill #2

## 2018-03-18 MED FILL — TEMAZEPAM 30 MG CAPSULE: 30 | 90 days supply | Qty: 90 | Fill #1

## 2018-03-18 MED FILL — BACLOFEN 10 MG TABLET: 10 | 90 days supply | Qty: 270 | Fill #1

## 2018-03-28 MED FILL — clonazePAM 1 MG TABS: 1 | 30 days supply | Qty: 60 | Fill #1

## 2018-04-04 DIAGNOSIS — Z13820 Encounter for screening for osteoporosis: Secondary | ICD-10-CM | POA: Diagnosis not present

## 2018-04-04 DIAGNOSIS — Z800 Family history of malignant neoplasm of digestive organs: Secondary | ICD-10-CM | POA: Diagnosis not present

## 2018-04-04 DIAGNOSIS — Z8041 Family history of malignant neoplasm of ovary: Secondary | ICD-10-CM | POA: Diagnosis not present

## 2018-04-04 DIAGNOSIS — Z6835 Body mass index (BMI) 35.0-35.9, adult: Secondary | ICD-10-CM | POA: Diagnosis not present

## 2018-04-04 DIAGNOSIS — Z01419 Encounter for gynecological examination (general) (routine) without abnormal findings: Secondary | ICD-10-CM | POA: Diagnosis not present

## 2018-04-04 DIAGNOSIS — Z86010 Personal history of colonic polyps: Secondary | ICD-10-CM | POA: Diagnosis not present

## 2018-04-04 DIAGNOSIS — Z803 Family history of malignant neoplasm of breast: Secondary | ICD-10-CM | POA: Diagnosis not present

## 2018-04-04 DIAGNOSIS — Z808 Family history of malignant neoplasm of other organs or systems: Secondary | ICD-10-CM | POA: Diagnosis not present

## 2018-04-04 MED FILL — busPIRone HCL 10 MG TABS: 10 | 15 days supply | Qty: 30 | Fill #0

## 2018-04-04 MED FILL — ESTRADIOL 0.075 MG/24HR PTT: 0.075 | 28 days supply | Qty: 8 | Fill #0

## 2018-04-12 MED FILL — BUTALB-ACETAMIN-CAFF 50-325: 50-325-40 | 30 days supply | Qty: 60 | Fill #2

## 2018-04-12 MED FILL — cloNIDine HCL 0.2 MG TABS: 0.2 | 90 days supply | Qty: 180 | Fill #0

## 2018-04-12 MED FILL — tiZANidine HCL 4 MG TABS: 4 | 10 days supply | Qty: 30 | Fill #3

## 2018-04-18 DIAGNOSIS — N390 Urinary tract infection, site not specified: Secondary | ICD-10-CM | POA: Diagnosis not present

## 2018-04-18 MED FILL — buPROPion HCL ER (XL) 300 M: 300 | 30 days supply | Qty: 30 | Fill #2

## 2018-04-27 MED FILL — busPIRone HCL 10 MG TABS: 10 | 15 days supply | Qty: 30 | Fill #1

## 2018-04-27 MED FILL — clonazePAM 1 MG TABS: 1 | 30 days supply | Qty: 60 | Fill #2

## 2018-04-29 MED FILL — OXYCODONE-ACETAMINOPHEN 5-3: 5-325 | 5 days supply | Qty: 20 | Fill #0

## 2018-05-11 MED FILL — tiZANidine HCL 4 MG TABS: 4 | 10 days supply | Qty: 30 | Fill #0

## 2018-05-11 MED FILL — BUTALB-ACETAMIN-CAFF 50-325: 50-325-40 | 30 days supply | Qty: 60 | Fill #3

## 2018-05-12 ENCOUNTER — Other Ambulatory Visit: Payer: Self-pay | Admitting: Obstetrics and Gynecology

## 2018-05-12 DIAGNOSIS — Z1231 Encounter for screening mammogram for malignant neoplasm of breast: Secondary | ICD-10-CM

## 2018-05-20 MED FILL — buPROPion HCL ER (XL) 300 M: 300 | 30 days supply | Qty: 30 | Fill #3

## 2018-05-20 MED FILL — PROMETHAZINE 25 MG TABLET: 25 | 23 days supply | Qty: 90 | Fill #0

## 2018-05-25 MED FILL — clonazePAM 1 MG TABS: 1 | 30 days supply | Qty: 60 | Fill #3

## 2018-05-31 MED FILL — tiZANidine HCL 4 MG TABS: 4 | 10 days supply | Qty: 30 | Fill #1

## 2018-06-01 DIAGNOSIS — I10 Essential (primary) hypertension: Secondary | ICD-10-CM | POA: Diagnosis not present

## 2018-06-01 DIAGNOSIS — G4700 Insomnia, unspecified: Secondary | ICD-10-CM | POA: Diagnosis not present

## 2018-06-01 DIAGNOSIS — L308 Other specified dermatitis: Secondary | ICD-10-CM | POA: Diagnosis not present

## 2018-06-01 DIAGNOSIS — F419 Anxiety disorder, unspecified: Secondary | ICD-10-CM | POA: Diagnosis not present

## 2018-06-01 DIAGNOSIS — G43009 Migraine without aura, not intractable, without status migrainosus: Secondary | ICD-10-CM | POA: Diagnosis not present

## 2018-06-01 DIAGNOSIS — E559 Vitamin D deficiency, unspecified: Secondary | ICD-10-CM | POA: Diagnosis not present

## 2018-06-01 DIAGNOSIS — F331 Major depressive disorder, recurrent, moderate: Secondary | ICD-10-CM | POA: Diagnosis not present

## 2018-06-01 DIAGNOSIS — M545 Low back pain: Secondary | ICD-10-CM | POA: Diagnosis not present

## 2018-06-01 DIAGNOSIS — K219 Gastro-esophageal reflux disease without esophagitis: Secondary | ICD-10-CM | POA: Diagnosis not present

## 2018-06-01 MED FILL — NAFTIFINE HCL 2 % CREA: 2 | 14 days supply | Qty: 45 | Fill #0

## 2018-06-01 MED FILL — AMLODIPINE BESYLATE 10 MG T: 10 | 30 days supply | Qty: 30 | Fill #1

## 2018-06-02 MED FILL — ESOMEPRAZOLE MAG DR 40 MG C: 40 | 90 days supply | Qty: 90 | Fill #0

## 2018-06-14 MED FILL — BUTALB-ACETAMIN-CAFF 50-325: 50-325-40 | 30 days supply | Qty: 60 | Fill #0

## 2018-06-14 MED FILL — TEMAZEPAM 30 MG CAPSULE: 30 | 90 days supply | Qty: 90 | Fill #0

## 2018-06-20 ENCOUNTER — Ambulatory Visit
Admission: RE | Admit: 2018-06-20 | Discharge: 2018-06-20 | Disposition: A | Discharge status: Home / Self Care | Payer: 59 | Source: Ambulatory Visit | Attending: Obstetrics and Gynecology | Admitting: Obstetrics and Gynecology

## 2018-06-20 DIAGNOSIS — Z1231 Encounter for screening mammogram for malignant neoplasm of breast: Secondary | ICD-10-CM

## 2018-06-22 DIAGNOSIS — M25561 Pain in right knee: Secondary | ICD-10-CM | POA: Insufficient documentation

## 2018-06-30 MED FILL — clonazePAM 1 MG TABS: 1 | 30 days supply | Qty: 60 | Fill #0

## 2018-07-01 MED FILL — BACLOFEN 10 MG TABLET: 10 | 90 days supply | Qty: 270 | Fill #0

## 2018-07-05 MED FILL — buPROPion HCL ER (XL) 300 M: 300 | 30 days supply | Qty: 30 | Fill #4

## 2018-07-06 DIAGNOSIS — I10 Essential (primary) hypertension: Secondary | ICD-10-CM | POA: Diagnosis not present

## 2018-07-06 DIAGNOSIS — E559 Vitamin D deficiency, unspecified: Secondary | ICD-10-CM | POA: Diagnosis not present

## 2018-07-06 DIAGNOSIS — M25561 Pain in right knee: Secondary | ICD-10-CM | POA: Diagnosis not present

## 2018-07-11 MED FILL — tiZANidine HCL 4 MG TABS: 4 | 10 days supply | Qty: 30 | Fill #0

## 2018-07-11 MED FILL — AMLODIPINE BESYLATE 10 MG T: 10 | 30 days supply | Qty: 30 | Fill #2

## 2018-07-11 MED FILL — cloNIDine HCL 0.2 MG TABS: 0.2 | 90 days supply | Qty: 180 | Fill #1

## 2018-07-11 MED FILL — IRBESARTAN 300 MG TABLET: 300 | 90 days supply | Qty: 90 | Fill #0

## 2018-07-15 MED FILL — BUTALB-ACETAMIN-CAFF 50-325: 50-325-40 | 30 days supply | Qty: 60 | Fill #1

## 2018-07-19 DIAGNOSIS — H524 Presbyopia: Secondary | ICD-10-CM | POA: Diagnosis not present

## 2018-07-19 DIAGNOSIS — H52203 Unspecified astigmatism, bilateral: Secondary | ICD-10-CM | POA: Diagnosis not present

## 2018-07-19 DIAGNOSIS — H5213 Myopia, bilateral: Secondary | ICD-10-CM | POA: Diagnosis not present

## 2018-08-03 DIAGNOSIS — D1801 Hemangioma of skin and subcutaneous tissue: Secondary | ICD-10-CM | POA: Diagnosis not present

## 2018-08-03 DIAGNOSIS — L821 Other seborrheic keratosis: Secondary | ICD-10-CM | POA: Diagnosis not present

## 2018-08-03 DIAGNOSIS — R739 Hyperglycemia, unspecified: Secondary | ICD-10-CM | POA: Diagnosis not present

## 2018-08-08 MED FILL — clonazePAM 1 MG TABS: 1 | 30 days supply | Qty: 60 | Fill #1

## 2018-08-08 MED FILL — AMLODIPINE BESYLATE 10 MG T: 10 | 30 days supply | Qty: 30 | Fill #3

## 2018-08-08 MED FILL — buPROPion HCL ER (XL) 300 M: 300 | 30 days supply | Qty: 30 | Fill #5

## 2018-08-12 MED FILL — OXYCODONE-ACETAMINOPHEN 5-3: 5-325 | 5 days supply | Qty: 20 | Fill #0

## 2018-08-12 MED FILL — PROMETHAZINE 25 MG TABLET: 25 | 23 days supply | Qty: 90 | Fill #0

## 2018-08-17 MED FILL — BUTALB-ACETAMIN-CAFF 50-325: 50-325-40 | 30 days supply | Qty: 60 | Fill #2

## 2018-08-30 MED FILL — tiZANidine HCL 4 MG TABS: 4 | 10 days supply | Qty: 30 | Fill #1

## 2018-08-30 MED FILL — ESOMEPRAZOLE MAG DR 40 MG C: 40 | 90 days supply | Qty: 90 | Fill #1

## 2018-09-07 DIAGNOSIS — M5386 Other specified dorsopathies, lumbar region: Secondary | ICD-10-CM | POA: Diagnosis not present

## 2018-09-07 DIAGNOSIS — M9905 Segmental and somatic dysfunction of pelvic region: Secondary | ICD-10-CM | POA: Diagnosis not present

## 2018-09-07 DIAGNOSIS — M9903 Segmental and somatic dysfunction of lumbar region: Secondary | ICD-10-CM | POA: Diagnosis not present

## 2018-09-07 DIAGNOSIS — M9902 Segmental and somatic dysfunction of thoracic region: Secondary | ICD-10-CM | POA: Diagnosis not present

## 2018-09-07 DIAGNOSIS — M9904 Segmental and somatic dysfunction of sacral region: Secondary | ICD-10-CM | POA: Diagnosis not present

## 2018-09-09 DIAGNOSIS — M9904 Segmental and somatic dysfunction of sacral region: Secondary | ICD-10-CM | POA: Diagnosis not present

## 2018-09-09 DIAGNOSIS — M5386 Other specified dorsopathies, lumbar region: Secondary | ICD-10-CM | POA: Diagnosis not present

## 2018-09-09 DIAGNOSIS — M9905 Segmental and somatic dysfunction of pelvic region: Secondary | ICD-10-CM | POA: Diagnosis not present

## 2018-09-09 DIAGNOSIS — M9902 Segmental and somatic dysfunction of thoracic region: Secondary | ICD-10-CM | POA: Diagnosis not present

## 2018-09-09 DIAGNOSIS — M9903 Segmental and somatic dysfunction of lumbar region: Secondary | ICD-10-CM | POA: Diagnosis not present

## 2018-09-09 MED FILL — tiZANidine HCL 4 MG TABS: 4 | 10 days supply | Qty: 30 | Fill #2

## 2018-09-09 MED FILL — clonazePAM 1 MG TABS: 1 | 30 days supply | Qty: 60 | Fill #2

## 2018-09-09 MED FILL — ESTRADIOL 0.075 MG/24HR PTT: 0.075 | 28 days supply | Qty: 8 | Fill #1

## 2018-09-09 MED FILL — buPROPion HCL ER (XL) 300 M: 300 | 30 days supply | Qty: 30 | Fill #0

## 2018-09-09 MED FILL — AMLODIPINE BESYLATE 10 MG T: 10 | 30 days supply | Qty: 30 | Fill #0

## 2018-09-09 MED FILL — TEMAZEPAM 30 MG CAPSULE: 30 | 90 days supply | Qty: 90 | Fill #1

## 2018-09-13 DIAGNOSIS — M5386 Other specified dorsopathies, lumbar region: Secondary | ICD-10-CM | POA: Diagnosis not present

## 2018-09-13 DIAGNOSIS — M9904 Segmental and somatic dysfunction of sacral region: Secondary | ICD-10-CM | POA: Diagnosis not present

## 2018-09-13 DIAGNOSIS — M9905 Segmental and somatic dysfunction of pelvic region: Secondary | ICD-10-CM | POA: Diagnosis not present

## 2018-09-13 DIAGNOSIS — M9902 Segmental and somatic dysfunction of thoracic region: Secondary | ICD-10-CM | POA: Diagnosis not present

## 2018-09-13 DIAGNOSIS — M9903 Segmental and somatic dysfunction of lumbar region: Secondary | ICD-10-CM | POA: Diagnosis not present

## 2018-09-15 DIAGNOSIS — M5386 Other specified dorsopathies, lumbar region: Secondary | ICD-10-CM | POA: Diagnosis not present

## 2018-09-15 DIAGNOSIS — M9903 Segmental and somatic dysfunction of lumbar region: Secondary | ICD-10-CM | POA: Diagnosis not present

## 2018-09-15 DIAGNOSIS — M9904 Segmental and somatic dysfunction of sacral region: Secondary | ICD-10-CM | POA: Diagnosis not present

## 2018-09-15 DIAGNOSIS — M9905 Segmental and somatic dysfunction of pelvic region: Secondary | ICD-10-CM | POA: Diagnosis not present

## 2018-09-15 DIAGNOSIS — M9902 Segmental and somatic dysfunction of thoracic region: Secondary | ICD-10-CM | POA: Diagnosis not present

## 2018-09-15 MED FILL — BUTALB-ACETAMIN-CAFF 50-325: 50-325-40 | 30 days supply | Qty: 60 | Fill #3

## 2018-09-23 MED FILL — tiZANidine HCL 4 MG TABS: 4 | 10 days supply | Qty: 30 | Fill #0

## 2018-09-29 MED FILL — BACLOFEN 10 MG TABLET: 10 | 90 days supply | Qty: 270 | Fill #1

## 2018-10-11 MED FILL — AMLODIPINE BESYLATE 10 MG T: 10 | 30 days supply | Qty: 30 | Fill #1

## 2018-10-11 MED FILL — tiZANidine HCL 4 MG TABS: 4 | 10 days supply | Qty: 30 | Fill #1

## 2018-10-11 MED FILL — IRBESARTAN 300 MG TABS: 300 | 90 days supply | Qty: 90 | Fill #1

## 2018-10-11 MED FILL — buPROPion HCL ER (XL) 300 M: 300 | 30 days supply | Qty: 30 | Fill #1

## 2018-10-11 MED FILL — ESTRADIOL 0.075 MG/24HR PTT: 0.075 | 28 days supply | Qty: 8 | Fill #2

## 2018-10-11 MED FILL — clonazePAM 1 MG TABS: 1 | 30 days supply | Qty: 60 | Fill #3

## 2018-10-18 MED FILL — BUTALB-ACETAMIN-CAFF 50-325: 50-325-40 | 4 days supply | Qty: 60 | Fill #0

## 2018-10-26 MED FILL — tiZANidine HCL 4 MG TABS: 4 | 10 days supply | Qty: 30 | Fill #2

## 2018-11-02 DIAGNOSIS — R300 Dysuria: Secondary | ICD-10-CM | POA: Diagnosis not present

## 2018-11-08 MED FILL — tiZANidine HCL 4 MG TABS: 4 | 10 days supply | Qty: 30 | Fill #0

## 2018-11-08 MED FILL — PROMETHAZINE 25 MG TABLET: 25 | 22 days supply | Qty: 90 | Fill #0

## 2018-11-08 MED FILL — clonazePAM 1 MG TABS: 1 | 30 days supply | Qty: 60 | Fill #0

## 2018-11-17 MED FILL — BUTALB-ACETAMIN-CAFF 50-325: 50-325-40 | 4 days supply | Qty: 60 | Fill #1

## 2018-11-17 MED FILL — AMLODIPINE BESYLATE 10 MG T: 10 | 30 days supply | Qty: 30 | Fill #2

## 2018-11-17 MED FILL — buPROPion HCL ER (XL) 300 M: 300 | 30 days supply | Qty: 30 | Fill #2

## 2018-11-30 MED FILL — tiZANidine HCL 4 MG TABS: 4 | 10 days supply | Qty: 30 | Fill #1

## 2018-12-02 MED FILL — cloNIDine HCL 0.2 MG TABS: 0.2 | 90 days supply | Qty: 180 | Fill #2

## 2018-12-05 MED FILL — ESOMEPRAZOLE MAG DR 40 MG C: 40 | 90 days supply | Qty: 90 | Fill #0

## 2018-12-06 DIAGNOSIS — J029 Acute pharyngitis, unspecified: Secondary | ICD-10-CM | POA: Diagnosis not present

## 2018-12-06 DIAGNOSIS — B349 Viral infection, unspecified: Secondary | ICD-10-CM | POA: Diagnosis not present

## 2018-12-09 DIAGNOSIS — G4700 Insomnia, unspecified: Secondary | ICD-10-CM | POA: Diagnosis not present

## 2018-12-09 DIAGNOSIS — G43009 Migraine without aura, not intractable, without status migrainosus: Secondary | ICD-10-CM | POA: Diagnosis not present

## 2018-12-09 DIAGNOSIS — F419 Anxiety disorder, unspecified: Secondary | ICD-10-CM | POA: Diagnosis not present

## 2018-12-09 DIAGNOSIS — I10 Essential (primary) hypertension: Secondary | ICD-10-CM | POA: Diagnosis not present

## 2018-12-09 DIAGNOSIS — E559 Vitamin D deficiency, unspecified: Secondary | ICD-10-CM | POA: Diagnosis not present

## 2018-12-09 DIAGNOSIS — K219 Gastro-esophageal reflux disease without esophagitis: Secondary | ICD-10-CM | POA: Diagnosis not present

## 2018-12-09 DIAGNOSIS — J069 Acute upper respiratory infection, unspecified: Secondary | ICD-10-CM | POA: Diagnosis not present

## 2018-12-09 DIAGNOSIS — R110 Nausea: Secondary | ICD-10-CM | POA: Diagnosis not present

## 2018-12-09 DIAGNOSIS — F331 Major depressive disorder, recurrent, moderate: Secondary | ICD-10-CM | POA: Diagnosis not present

## 2018-12-09 DIAGNOSIS — R7303 Prediabetes: Secondary | ICD-10-CM | POA: Diagnosis not present

## 2018-12-12 MED FILL — HYDROCODONE-HOMATROPINE SYR: 5-1.5 | 5 days supply | Qty: 100 | Fill #0

## 2018-12-15 MED FILL — tiZANidine HCL 4 MG TABS: 4 | 10 days supply | Qty: 30 | Fill #0

## 2018-12-15 MED FILL — clonazePAM 1 MG TABS: 1 | 30 days supply | Qty: 60 | Fill #1

## 2018-12-16 MED FILL — AMLODIPINE BESYLATE 10 MG T: 10 | 30 days supply | Qty: 30 | Fill #3

## 2018-12-16 MED FILL — buPROPion HCL ER (XL) 300 M: 300 | 30 days supply | Qty: 30 | Fill #3

## 2018-12-19 MED FILL — BUTALB-ACETAMIN-CAFF 50-325: 50-325-40 | 30 days supply | Qty: 60 | Fill #2

## 2018-12-19 MED FILL — TEMAZEPAM 30 MG CAPSULE: 30 | 90 days supply | Qty: 90 | Fill #0

## 2018-12-28 MED FILL — tiZANidine HCL 4 MG TABS: 4 | 10 days supply | Qty: 30 | Fill #1

## 2019-01-12 MED FILL — OXYCODONE-ACETAMINOPHEN 5-3: 5-325 | 5 days supply | Qty: 20 | Fill #0

## 2019-01-12 MED FILL — tiZANidine HCL 4 MG TABS: 4 | 10 days supply | Qty: 30 | Fill #2

## 2019-01-17 MED FILL — clonazePAM 1 MG TABS: 1 | 30 days supply | Qty: 60 | Fill #2

## 2019-01-18 MED FILL — BACLOFEN 10 MG TABS: 10 | 90 days supply | Qty: 270 | Fill #0

## 2019-01-18 MED FILL — BUTALB-ACETAMIN-CAFF 50-325: 50-325-40 | 30 days supply | Qty: 60 | Fill #3

## 2019-01-24 MED FILL — HYDROCODONE-HOMATROPINE SYR: 5-1.5 | 5 days supply | Qty: 100 | Fill #0

## 2019-01-26 DIAGNOSIS — N390 Urinary tract infection, site not specified: Secondary | ICD-10-CM | POA: Diagnosis not present

## 2019-01-26 DIAGNOSIS — J069 Acute upper respiratory infection, unspecified: Secondary | ICD-10-CM | POA: Diagnosis not present

## 2019-01-26 MED FILL — CIPROFLOXACIN HCL 250 MG TA: 250 | 3 days supply | Qty: 6 | Fill #0

## 2019-02-01 MED FILL — PROMETHAZINE 25 MG TABLET: 25 | 22 days supply | Qty: 90 | Fill #0

## 2019-02-02 MED FILL — buPROPion HCL ER (XL) 300 M: 300 | 30 days supply | Qty: 30 | Fill #0

## 2019-02-02 MED FILL — IRBESARTAN 300 MG TAB: 300 | 30 days supply | Qty: 30 | Fill #0

## 2019-02-02 MED FILL — tiZANidine HCL 4 MG TABS: 4 | 10 days supply | Qty: 30 | Fill #0

## 2019-02-17 MED FILL — clonazePAM 1 MG TABS: 1 | 30 days supply | Qty: 60 | Fill #3

## 2019-02-20 MED FILL — BUTALB-ACETAMIN-CAFF 50-325: 50-325-40 | 7 days supply | Qty: 60 | Fill #0

## 2019-03-07 MED FILL — OXYCODONE-ACETAMINOPHEN 5-3: 5-325 | 5 days supply | Qty: 20 | Fill #0

## 2019-03-07 MED FILL — ESOMEPRAZOLE MAG DR 40 MG C: 40 | 90 days supply | Qty: 90 | Fill #0

## 2019-03-08 MED FILL — tiZANidine HCL 4 MG TABS: 4 | 10 days supply | Qty: 30 | Fill #1

## 2019-03-08 MED FILL — IRBESARTAN 300 MG TAB: 300 | 30 days supply | Qty: 30 | Fill #1

## 2019-03-08 MED FILL — buPROPion HCL ER (XL) 300 M: 300 | 30 days supply | Qty: 30 | Fill #1

## 2019-03-17 MED FILL — clonazePAM 1 MG TABS: 1 | 30 days supply | Qty: 60 | Fill #0

## 2019-03-17 MED FILL — TEMAZEPAM 30 MG CAPSULE: 30 | 90 days supply | Qty: 90 | Fill #1

## 2019-03-23 MED FILL — NAFTIFINE HCL 2 % CREA: 2 | 30 days supply | Qty: 45 | Fill #0

## 2019-03-23 MED FILL — BUTALBITAL-APAP-CAFFEINE 50: 50-325-40 | 7 days supply | Qty: 60 | Fill #1

## 2019-03-30 MED FILL — tiZANidine HCL 4 MG TABS: 4 | 10 days supply | Qty: 30 | Fill #2

## 2019-04-06 MED FILL — IRBESARTAN 300 MG TABLET: 300 | 90 days supply | Qty: 90 | Fill #2

## 2019-04-06 MED FILL — buPROPion HCL ER (XL) 300 M: 300 | 30 days supply | Qty: 30 | Fill #2

## 2019-04-13 MED FILL — TOBRAMYCIN-DEXAMETH OPTH SU: 0.3-0.1 | 13 days supply | Qty: 5 | Fill #0

## 2019-04-13 MED FILL — tiZANidine HCL 4 MG TABS: 4 | 10 days supply | Qty: 30 | Fill #3

## 2019-04-17 MED FILL — clonazePAM 1 MG TABS: 1 | 30 days supply | Qty: 60 | Fill #1

## 2019-04-20 MED FILL — BUTALB-ACETAMIN-CAFF 50-325: 50-325-40 | 7 days supply | Qty: 60 | Fill #2

## 2019-04-20 MED FILL — BACLOFEN 10 MG TABS: 10 | 90 days supply | Qty: 270 | Fill #1

## 2019-05-02 DIAGNOSIS — M545 Low back pain: Secondary | ICD-10-CM | POA: Diagnosis not present

## 2019-05-02 DIAGNOSIS — G43009 Migraine without aura, not intractable, without status migrainosus: Secondary | ICD-10-CM | POA: Diagnosis not present

## 2019-05-02 MED FILL — PROMETHAZINE 25 MG TABLET: 25 | 22 days supply | Qty: 90 | Fill #1

## 2019-05-02 MED FILL — MELOXICAM 15 MG TABLET: 15 | 14 days supply | Qty: 14 | Fill #0

## 2019-05-02 MED FILL — tiZANidine HCL 4 MG TABS: 4 | 10 days supply | Qty: 30 | Fill #4

## 2019-05-04 DIAGNOSIS — Z6832 Body mass index (BMI) 32.0-32.9, adult: Secondary | ICD-10-CM | POA: Diagnosis not present

## 2019-05-04 DIAGNOSIS — Z01419 Encounter for gynecological examination (general) (routine) without abnormal findings: Secondary | ICD-10-CM | POA: Diagnosis not present

## 2019-05-04 MED FILL — BETAMETHASONE VALERATE 0.1: 0.1 | 28 days supply | Qty: 15 | Fill #0

## 2019-05-04 MED FILL — ESTRADIOL 0.075 MG/24HR PTT: 0.075 | 84 days supply | Qty: 24 | Fill #0

## 2019-05-17 MED FILL — clonazePAM 1 MG TABS: 1 | 30 days supply | Qty: 60 | Fill #2

## 2019-05-17 MED FILL — buPROPion HCL ER (XL) 300 M: 300 | 30 days supply | Qty: 30 | Fill #3

## 2019-05-22 MED FILL — BUTALBITAL-APAP-CAFFEINE 50: 50-325-40 | 7 days supply | Qty: 60 | Fill #3

## 2019-05-23 DIAGNOSIS — I10 Essential (primary) hypertension: Secondary | ICD-10-CM | POA: Diagnosis not present

## 2019-05-23 DIAGNOSIS — F419 Anxiety disorder, unspecified: Secondary | ICD-10-CM | POA: Diagnosis not present

## 2019-05-23 DIAGNOSIS — R7303 Prediabetes: Secondary | ICD-10-CM | POA: Diagnosis not present

## 2019-05-23 DIAGNOSIS — E559 Vitamin D deficiency, unspecified: Secondary | ICD-10-CM | POA: Diagnosis not present

## 2019-05-23 DIAGNOSIS — G4700 Insomnia, unspecified: Secondary | ICD-10-CM | POA: Diagnosis not present

## 2019-05-23 DIAGNOSIS — K219 Gastro-esophageal reflux disease without esophagitis: Secondary | ICD-10-CM | POA: Diagnosis not present

## 2019-05-23 DIAGNOSIS — G43009 Migraine without aura, not intractable, without status migrainosus: Secondary | ICD-10-CM | POA: Diagnosis not present

## 2019-05-23 DIAGNOSIS — G8929 Other chronic pain: Secondary | ICD-10-CM | POA: Diagnosis not present

## 2019-05-23 MED FILL — MELOXICAM 15 MG TABLET: 15 | 30 days supply | Qty: 30 | Fill #0

## 2019-06-01 MED FILL — cloNIDine HCL 0.2 MG TABS: 0.2 | 90 days supply | Qty: 180 | Fill #0

## 2019-06-07 DIAGNOSIS — N3000 Acute cystitis without hematuria: Secondary | ICD-10-CM | POA: Diagnosis not present

## 2019-06-07 MED FILL — tiZANidine HCL 4 MG TABS: 4 | 10 days supply | Qty: 30 | Fill #0

## 2019-06-07 MED FILL — ESOMEPRAZOLE MAG DR 40 MG C: 40 | 90 days supply | Qty: 90 | Fill #0

## 2019-06-15 MED FILL — TEMAZEPAM 30 MG CAPSULE: 30 | 90 days supply | Qty: 90 | Fill #0

## 2019-06-15 MED FILL — clonazePAM 1 MG TABS: 1 | 30 days supply | Qty: 60 | Fill #0

## 2019-06-22 MED FILL — tiZANidine HCL 4 MG TABS: 4 | 10 days supply | Qty: 30 | Fill #1

## 2019-06-26 MED FILL — buPROPion HCL ER (XL) 300 M: 300 | 30 days supply | Qty: 30 | Fill #4

## 2019-06-30 DIAGNOSIS — I10 Essential (primary) hypertension: Secondary | ICD-10-CM | POA: Diagnosis not present

## 2019-06-30 DIAGNOSIS — E559 Vitamin D deficiency, unspecified: Secondary | ICD-10-CM | POA: Diagnosis not present

## 2019-06-30 DIAGNOSIS — R7303 Prediabetes: Secondary | ICD-10-CM | POA: Diagnosis not present

## 2019-07-04 DIAGNOSIS — N762 Acute vulvitis: Secondary | ICD-10-CM | POA: Diagnosis not present

## 2019-07-04 DIAGNOSIS — Z1231 Encounter for screening mammogram for malignant neoplasm of breast: Secondary | ICD-10-CM | POA: Diagnosis not present

## 2019-07-10 MED FILL — tiZANidine HCL 4 MG TABS: 4 | 10 days supply | Qty: 30 | Fill #2

## 2019-07-17 MED FILL — clonazePAM 1 MG TABS: 1 | 30 days supply | Qty: 60 | Fill #1

## 2019-07-17 MED FILL — BACLOFEN 10 MG TABS: 10 | 90 days supply | Qty: 270 | Fill #0

## 2019-07-31 MED FILL — PROMETHAZINE 25 MG TABLET: 25 | 22 days supply | Qty: 90 | Fill #0

## 2019-08-02 MED FILL — buPROPion HCL ER (XL) 300 M: 300 | 90 days supply | Qty: 90 | Fill #0

## 2019-08-02 MED FILL — IRBESARTAN 300 MG TABS: 300 | 30 days supply | Qty: 30 | Fill #0

## 2019-08-02 MED FILL — tiZANidine HCL 4 MG TABS: 4 | 10 days supply | Qty: 30 | Fill #3

## 2019-08-03 DIAGNOSIS — H5213 Myopia, bilateral: Secondary | ICD-10-CM | POA: Diagnosis not present

## 2019-08-03 DIAGNOSIS — H524 Presbyopia: Secondary | ICD-10-CM | POA: Diagnosis not present

## 2019-08-17 MED FILL — clonazePAM 1 MG TABS: 1 | 30 days supply | Qty: 60 | Fill #2

## 2019-08-17 MED FILL — tiZANidine HCL 4 MG TABS: 4 | 10 days supply | Qty: 30 | Fill #4

## 2019-09-07 MED FILL — tiZANidine HCL 4 MG TABS: 4 | 10 days supply | Qty: 30 | Fill #5

## 2019-09-13 MED FILL — TEMAZEPAM 30 MG CAPSULE: 30 | 90 days supply | Qty: 90 | Fill #1

## 2019-09-18 MED FILL — clonazePAM 1 MG TABS: 1 | 30 days supply | Qty: 60 | Fill #3

## 2019-09-18 MED FILL — tiZANidine HCL 4 MG TABS: 4 | 10 days supply | Qty: 30 | Fill #3

## 2019-09-25 MED FILL — IRBESARTAN 300 MG TABS: 300 | 30 days supply | Qty: 30 | Fill #1

## 2019-10-04 MED FILL — PROMETHAZINE 25 MG TABLET: 25 | 22 days supply | Qty: 90 | Fill #1

## 2019-10-06 MED FILL — tiZANidine HCL 4 MG TABS: 4 | 10 days supply | Qty: 30 | Fill #0

## 2019-10-16 MED FILL — BACLOFEN 10 MG TABS: 10 | 90 days supply | Qty: 270 | Fill #1

## 2019-10-25 MED FILL — IRBESARTAN 300 MG TABS: 300 | 30 days supply | Qty: 30 | Fill #2

## 2019-10-25 MED FILL — MELOXICAM 15 MG TABLET: 15 | 30 days supply | Qty: 30 | Fill #1

## 2019-10-25 MED FILL — AMLODIPINE BESYLATE 10 MG T: 10 | 30 days supply | Qty: 30 | Fill #0

## 2019-10-25 MED FILL — clonazePAM 1 MG TABS: 1 | 30 days supply | Qty: 60 | Fill #0

## 2019-10-25 MED FILL — tiZANidine HCL 4 MG TABS: 4 | 10 days supply | Qty: 30 | Fill #1

## 2019-11-10 MED FILL — tiZANidine HCL 4 MG TABS: 4 | 10 days supply | Qty: 30 | Fill #2

## 2019-11-22 MED FILL — BUPROPION HCL XL 300 MG TAB: 300 | 30 days supply | Qty: 30 | Fill #1

## 2019-11-27 MED FILL — clonazePAM 1 MG TABS: 1 | 30 days supply | Qty: 60 | Fill #1

## 2019-11-27 MED FILL — tiZANidine HCL 4 MG TABS: 4 | 10 days supply | Qty: 30 | Fill #3

## 2019-11-28 DIAGNOSIS — M6240 Contracture of muscle, unspecified site: Secondary | ICD-10-CM | POA: Diagnosis not present

## 2019-11-28 DIAGNOSIS — M5137 Other intervertebral disc degeneration, lumbosacral region: Secondary | ICD-10-CM | POA: Diagnosis not present

## 2019-11-28 DIAGNOSIS — M9905 Segmental and somatic dysfunction of pelvic region: Secondary | ICD-10-CM | POA: Diagnosis not present

## 2019-11-28 DIAGNOSIS — M5136 Other intervertebral disc degeneration, lumbar region: Secondary | ICD-10-CM | POA: Diagnosis not present

## 2019-11-28 DIAGNOSIS — M9903 Segmental and somatic dysfunction of lumbar region: Secondary | ICD-10-CM | POA: Diagnosis not present

## 2019-11-28 DIAGNOSIS — M5417 Radiculopathy, lumbosacral region: Secondary | ICD-10-CM | POA: Diagnosis not present

## 2019-11-28 DIAGNOSIS — M9902 Segmental and somatic dysfunction of thoracic region: Secondary | ICD-10-CM | POA: Diagnosis not present

## 2019-11-30 DIAGNOSIS — M5137 Other intervertebral disc degeneration, lumbosacral region: Secondary | ICD-10-CM | POA: Diagnosis not present

## 2019-11-30 DIAGNOSIS — M5417 Radiculopathy, lumbosacral region: Secondary | ICD-10-CM | POA: Diagnosis not present

## 2019-11-30 DIAGNOSIS — M9903 Segmental and somatic dysfunction of lumbar region: Secondary | ICD-10-CM | POA: Diagnosis not present

## 2019-11-30 DIAGNOSIS — M5136 Other intervertebral disc degeneration, lumbar region: Secondary | ICD-10-CM | POA: Diagnosis not present

## 2019-11-30 DIAGNOSIS — M9902 Segmental and somatic dysfunction of thoracic region: Secondary | ICD-10-CM | POA: Diagnosis not present

## 2019-11-30 DIAGNOSIS — M6240 Contracture of muscle, unspecified site: Secondary | ICD-10-CM | POA: Diagnosis not present

## 2019-11-30 DIAGNOSIS — M9905 Segmental and somatic dysfunction of pelvic region: Secondary | ICD-10-CM | POA: Diagnosis not present

## 2019-11-30 MED FILL — IRBESARTAN 300 MG TABS: 300 | 30 days supply | Qty: 30 | Fill #3

## 2019-12-01 DIAGNOSIS — M5137 Other intervertebral disc degeneration, lumbosacral region: Secondary | ICD-10-CM | POA: Diagnosis not present

## 2019-12-01 DIAGNOSIS — M5136 Other intervertebral disc degeneration, lumbar region: Secondary | ICD-10-CM | POA: Diagnosis not present

## 2019-12-01 DIAGNOSIS — M9903 Segmental and somatic dysfunction of lumbar region: Secondary | ICD-10-CM | POA: Diagnosis not present

## 2019-12-01 DIAGNOSIS — M6240 Contracture of muscle, unspecified site: Secondary | ICD-10-CM | POA: Diagnosis not present

## 2019-12-01 DIAGNOSIS — M9902 Segmental and somatic dysfunction of thoracic region: Secondary | ICD-10-CM | POA: Diagnosis not present

## 2019-12-01 DIAGNOSIS — M9905 Segmental and somatic dysfunction of pelvic region: Secondary | ICD-10-CM | POA: Diagnosis not present

## 2019-12-01 DIAGNOSIS — M5417 Radiculopathy, lumbosacral region: Secondary | ICD-10-CM | POA: Diagnosis not present

## 2019-12-04 DIAGNOSIS — M6240 Contracture of muscle, unspecified site: Secondary | ICD-10-CM | POA: Diagnosis not present

## 2019-12-04 DIAGNOSIS — M5136 Other intervertebral disc degeneration, lumbar region: Secondary | ICD-10-CM | POA: Diagnosis not present

## 2019-12-04 DIAGNOSIS — M9903 Segmental and somatic dysfunction of lumbar region: Secondary | ICD-10-CM | POA: Diagnosis not present

## 2019-12-04 DIAGNOSIS — M9905 Segmental and somatic dysfunction of pelvic region: Secondary | ICD-10-CM | POA: Diagnosis not present

## 2019-12-04 DIAGNOSIS — M9902 Segmental and somatic dysfunction of thoracic region: Secondary | ICD-10-CM | POA: Diagnosis not present

## 2019-12-04 DIAGNOSIS — M5417 Radiculopathy, lumbosacral region: Secondary | ICD-10-CM | POA: Diagnosis not present

## 2019-12-04 DIAGNOSIS — M5137 Other intervertebral disc degeneration, lumbosacral region: Secondary | ICD-10-CM | POA: Diagnosis not present

## 2019-12-11 DIAGNOSIS — M5136 Other intervertebral disc degeneration, lumbar region: Secondary | ICD-10-CM | POA: Diagnosis not present

## 2019-12-11 DIAGNOSIS — M6240 Contracture of muscle, unspecified site: Secondary | ICD-10-CM | POA: Diagnosis not present

## 2019-12-11 DIAGNOSIS — M5137 Other intervertebral disc degeneration, lumbosacral region: Secondary | ICD-10-CM | POA: Diagnosis not present

## 2019-12-11 DIAGNOSIS — M5417 Radiculopathy, lumbosacral region: Secondary | ICD-10-CM | POA: Diagnosis not present

## 2019-12-11 DIAGNOSIS — M9902 Segmental and somatic dysfunction of thoracic region: Secondary | ICD-10-CM | POA: Diagnosis not present

## 2019-12-11 DIAGNOSIS — M9905 Segmental and somatic dysfunction of pelvic region: Secondary | ICD-10-CM | POA: Diagnosis not present

## 2019-12-11 DIAGNOSIS — M9903 Segmental and somatic dysfunction of lumbar region: Secondary | ICD-10-CM | POA: Diagnosis not present

## 2019-12-11 MED FILL — OXYCODONE-APAP 5-325MG: 5-325 | 5 days supply | Qty: 20 | Fill #0

## 2019-12-11 MED FILL — TEMAZEPAM 30 MG CAPSULE: 30 | 90 days supply | Qty: 90 | Fill #0

## 2019-12-11 MED FILL — ESOMEPRAZOLE MAG DR 40 MG C: 40 | 90 days supply | Qty: 90 | Fill #0

## 2019-12-11 MED FILL — tiZANidine HCL 4 MG TABS: 4 | 10 days supply | Qty: 30 | Fill #4

## 2019-12-14 DIAGNOSIS — M6240 Contracture of muscle, unspecified site: Secondary | ICD-10-CM | POA: Diagnosis not present

## 2019-12-14 DIAGNOSIS — M5137 Other intervertebral disc degeneration, lumbosacral region: Secondary | ICD-10-CM | POA: Diagnosis not present

## 2019-12-14 DIAGNOSIS — M9902 Segmental and somatic dysfunction of thoracic region: Secondary | ICD-10-CM | POA: Diagnosis not present

## 2019-12-14 DIAGNOSIS — M5417 Radiculopathy, lumbosacral region: Secondary | ICD-10-CM | POA: Diagnosis not present

## 2019-12-14 DIAGNOSIS — M5136 Other intervertebral disc degeneration, lumbar region: Secondary | ICD-10-CM | POA: Diagnosis not present

## 2019-12-14 DIAGNOSIS — M9905 Segmental and somatic dysfunction of pelvic region: Secondary | ICD-10-CM | POA: Diagnosis not present

## 2019-12-14 DIAGNOSIS — M9903 Segmental and somatic dysfunction of lumbar region: Secondary | ICD-10-CM | POA: Diagnosis not present

## 2019-12-19 DIAGNOSIS — M5137 Other intervertebral disc degeneration, lumbosacral region: Secondary | ICD-10-CM | POA: Diagnosis not present

## 2019-12-19 DIAGNOSIS — M5417 Radiculopathy, lumbosacral region: Secondary | ICD-10-CM | POA: Diagnosis not present

## 2019-12-19 DIAGNOSIS — M9903 Segmental and somatic dysfunction of lumbar region: Secondary | ICD-10-CM | POA: Diagnosis not present

## 2019-12-19 DIAGNOSIS — M6240 Contracture of muscle, unspecified site: Secondary | ICD-10-CM | POA: Diagnosis not present

## 2019-12-19 DIAGNOSIS — M9902 Segmental and somatic dysfunction of thoracic region: Secondary | ICD-10-CM | POA: Diagnosis not present

## 2019-12-19 DIAGNOSIS — M9905 Segmental and somatic dysfunction of pelvic region: Secondary | ICD-10-CM | POA: Diagnosis not present

## 2019-12-19 DIAGNOSIS — M5136 Other intervertebral disc degeneration, lumbar region: Secondary | ICD-10-CM | POA: Diagnosis not present

## 2019-12-21 ENCOUNTER — Other Ambulatory Visit (HOSPITAL_COMMUNITY): Payer: Self-pay | Admitting: Chiropractic Medicine

## 2019-12-21 ENCOUNTER — Other Ambulatory Visit: Payer: Self-pay | Admitting: Chiropractic Medicine

## 2019-12-21 DIAGNOSIS — M5417 Radiculopathy, lumbosacral region: Secondary | ICD-10-CM | POA: Diagnosis not present

## 2019-12-21 DIAGNOSIS — M9905 Segmental and somatic dysfunction of pelvic region: Secondary | ICD-10-CM | POA: Diagnosis not present

## 2019-12-21 DIAGNOSIS — M6240 Contracture of muscle, unspecified site: Secondary | ICD-10-CM | POA: Diagnosis not present

## 2019-12-21 DIAGNOSIS — M9903 Segmental and somatic dysfunction of lumbar region: Secondary | ICD-10-CM | POA: Diagnosis not present

## 2019-12-21 DIAGNOSIS — M5137 Other intervertebral disc degeneration, lumbosacral region: Secondary | ICD-10-CM | POA: Diagnosis not present

## 2019-12-21 DIAGNOSIS — M5136 Other intervertebral disc degeneration, lumbar region: Secondary | ICD-10-CM | POA: Diagnosis not present

## 2019-12-21 DIAGNOSIS — M9902 Segmental and somatic dysfunction of thoracic region: Secondary | ICD-10-CM | POA: Diagnosis not present

## 2019-12-23 MED FILL — tiZANidine HCL 4 MG TABS: 4 | 10 days supply | Qty: 30 | Fill #5

## 2019-12-25 MED FILL — cloNIDine HCL 0.2 MG TABS: 0.2 | 90 days supply | Qty: 180 | Fill #1

## 2019-12-25 MED FILL — clonazePAM 1 MG TABS: 1 | 30 days supply | Qty: 60 | Fill #2

## 2019-12-25 MED FILL — BUPROPION HCL ER (XL) 300 M: 300 | 30 days supply | Qty: 30 | Fill #2

## 2019-12-26 DIAGNOSIS — I10 Essential (primary) hypertension: Secondary | ICD-10-CM | POA: Diagnosis not present

## 2019-12-26 DIAGNOSIS — M5441 Lumbago with sciatica, right side: Secondary | ICD-10-CM | POA: Diagnosis not present

## 2019-12-26 DIAGNOSIS — G43009 Migraine without aura, not intractable, without status migrainosus: Secondary | ICD-10-CM | POA: Diagnosis not present

## 2019-12-26 DIAGNOSIS — R7303 Prediabetes: Secondary | ICD-10-CM | POA: Diagnosis not present

## 2019-12-26 DIAGNOSIS — F331 Major depressive disorder, recurrent, moderate: Secondary | ICD-10-CM | POA: Diagnosis not present

## 2019-12-26 DIAGNOSIS — G8929 Other chronic pain: Secondary | ICD-10-CM | POA: Diagnosis not present

## 2019-12-26 DIAGNOSIS — F419 Anxiety disorder, unspecified: Secondary | ICD-10-CM | POA: Diagnosis not present

## 2019-12-26 DIAGNOSIS — E559 Vitamin D deficiency, unspecified: Secondary | ICD-10-CM | POA: Diagnosis not present

## 2019-12-26 DIAGNOSIS — G4700 Insomnia, unspecified: Secondary | ICD-10-CM | POA: Diagnosis not present

## 2019-12-26 MED FILL — BUTALBITAL-APAP-CAFFEINE 50: 50-325-40 | 30 days supply | Qty: 60 | Fill #0

## 2019-12-26 MED FILL — ESCITALOPRAM 10 MG TABLET: 10 | 30 days supply | Qty: 30 | Fill #0

## 2019-12-26 MED FILL — IRBESARTAN 300 MG TABS: 300 | 90 days supply | Qty: 90 | Fill #0

## 2019-12-26 MED FILL — PROMETHAZINE 25 MG TABLET: 25 | 22 days supply | Qty: 90 | Fill #0

## 2019-12-26 MED FILL — AMLODIPINE BESYLATE 10 MG T: 10 | 30 days supply | Qty: 30 | Fill #0

## 2019-12-26 MED FILL — OXYCODONE-APAP 5-325MG: 5-325 | 5 days supply | Qty: 20 | Fill #0

## 2019-12-28 ENCOUNTER — Other Ambulatory Visit: Payer: Self-pay

## 2019-12-28 ENCOUNTER — Ambulatory Visit (HOSPITAL_COMMUNITY)
Admission: RE | Admit: 2019-12-28 | Discharge: 2019-12-28 | Disposition: A | Discharge status: Home / Self Care | Payer: 59 | Source: Ambulatory Visit | Attending: Chiropractic Medicine | Admitting: Chiropractic Medicine

## 2019-12-28 DIAGNOSIS — M5417 Radiculopathy, lumbosacral region: Secondary | ICD-10-CM | POA: Insufficient documentation

## 2019-12-28 DIAGNOSIS — M5115 Intervertebral disc disorders with radiculopathy, thoracolumbar region: Secondary | ICD-10-CM | POA: Diagnosis not present

## 2019-12-28 DIAGNOSIS — M48061 Spinal stenosis, lumbar region without neurogenic claudication: Secondary | ICD-10-CM | POA: Diagnosis not present

## 2019-12-29 MED FILL — BACLOFEN 10 MG TABS: 10 | 90 days supply | Qty: 270 | Fill #0

## 2020-01-03 DIAGNOSIS — M5136 Other intervertebral disc degeneration, lumbar region: Secondary | ICD-10-CM | POA: Diagnosis not present

## 2020-01-03 DIAGNOSIS — M6240 Contracture of muscle, unspecified site: Secondary | ICD-10-CM | POA: Diagnosis not present

## 2020-01-03 DIAGNOSIS — M5417 Radiculopathy, lumbosacral region: Secondary | ICD-10-CM | POA: Diagnosis not present

## 2020-01-03 DIAGNOSIS — M9902 Segmental and somatic dysfunction of thoracic region: Secondary | ICD-10-CM | POA: Diagnosis not present

## 2020-01-03 DIAGNOSIS — M9903 Segmental and somatic dysfunction of lumbar region: Secondary | ICD-10-CM | POA: Diagnosis not present

## 2020-01-03 DIAGNOSIS — M5137 Other intervertebral disc degeneration, lumbosacral region: Secondary | ICD-10-CM | POA: Diagnosis not present

## 2020-01-03 DIAGNOSIS — M9905 Segmental and somatic dysfunction of pelvic region: Secondary | ICD-10-CM | POA: Diagnosis not present

## 2020-01-04 MED FILL — CYCLOBENZAPRINE HCL 10 MG T: 10 | 10 days supply | Qty: 20 | Fill #0

## 2020-01-04 MED FILL — OXYCODONE-APAP 5-325MG: 5-325 | 5 days supply | Qty: 20 | Fill #0

## 2020-01-05 DIAGNOSIS — M6240 Contracture of muscle, unspecified site: Secondary | ICD-10-CM | POA: Diagnosis not present

## 2020-01-05 DIAGNOSIS — M5136 Other intervertebral disc degeneration, lumbar region: Secondary | ICD-10-CM | POA: Diagnosis not present

## 2020-01-05 DIAGNOSIS — M9905 Segmental and somatic dysfunction of pelvic region: Secondary | ICD-10-CM | POA: Diagnosis not present

## 2020-01-05 DIAGNOSIS — M5417 Radiculopathy, lumbosacral region: Secondary | ICD-10-CM | POA: Diagnosis not present

## 2020-01-05 DIAGNOSIS — M9903 Segmental and somatic dysfunction of lumbar region: Secondary | ICD-10-CM | POA: Diagnosis not present

## 2020-01-05 DIAGNOSIS — M5137 Other intervertebral disc degeneration, lumbosacral region: Secondary | ICD-10-CM | POA: Diagnosis not present

## 2020-01-05 DIAGNOSIS — M9902 Segmental and somatic dysfunction of thoracic region: Secondary | ICD-10-CM | POA: Diagnosis not present

## 2020-01-09 DIAGNOSIS — M9905 Segmental and somatic dysfunction of pelvic region: Secondary | ICD-10-CM | POA: Diagnosis not present

## 2020-01-09 DIAGNOSIS — M6240 Contracture of muscle, unspecified site: Secondary | ICD-10-CM | POA: Diagnosis not present

## 2020-01-09 DIAGNOSIS — M5136 Other intervertebral disc degeneration, lumbar region: Secondary | ICD-10-CM | POA: Diagnosis not present

## 2020-01-09 DIAGNOSIS — M5417 Radiculopathy, lumbosacral region: Secondary | ICD-10-CM | POA: Diagnosis not present

## 2020-01-09 DIAGNOSIS — M9902 Segmental and somatic dysfunction of thoracic region: Secondary | ICD-10-CM | POA: Diagnosis not present

## 2020-01-09 DIAGNOSIS — M9903 Segmental and somatic dysfunction of lumbar region: Secondary | ICD-10-CM | POA: Diagnosis not present

## 2020-01-09 DIAGNOSIS — M5137 Other intervertebral disc degeneration, lumbosacral region: Secondary | ICD-10-CM | POA: Diagnosis not present

## 2020-01-13 MED FILL — CYCLOBENZAPRINE HCL 10 MG T: 10 | 10 days supply | Qty: 20 | Fill #1

## 2020-01-16 DIAGNOSIS — M5126 Other intervertebral disc displacement, lumbar region: Secondary | ICD-10-CM | POA: Diagnosis not present

## 2020-01-16 DIAGNOSIS — M545 Low back pain: Secondary | ICD-10-CM | POA: Diagnosis not present

## 2020-01-16 MED FILL — OXYCODONE-APAP 10-325: 10-325 | 30 days supply | Qty: 60 | Fill #0

## 2020-01-18 DIAGNOSIS — M5126 Other intervertebral disc displacement, lumbar region: Secondary | ICD-10-CM | POA: Diagnosis not present

## 2020-01-24 MED FILL — CYCLOBENZAPRINE HCL 10 MG T: 10 | 10 days supply | Qty: 20 | Fill #0

## 2020-01-25 MED FILL — BUTALB-ACETAMIN-CAFF 50-325: 50-325-40 | 30 days supply | Qty: 60 | Fill #1

## 2020-01-31 MED FILL — clonazePAM 1 MG TABS: 1 | 30 days supply | Qty: 60 | Fill #0

## 2020-01-31 MED FILL — ESCITALOPRAM 10 MG TABLET: 10 | 30 days supply | Qty: 30 | Fill #1

## 2020-02-05 DIAGNOSIS — Z1152 Encounter for screening for COVID-19: Secondary | ICD-10-CM | POA: Diagnosis not present

## 2020-02-09 DIAGNOSIS — M5116 Intervertebral disc disorders with radiculopathy, lumbar region: Secondary | ICD-10-CM | POA: Diagnosis not present

## 2020-02-09 DIAGNOSIS — M5126 Other intervertebral disc displacement, lumbar region: Secondary | ICD-10-CM | POA: Diagnosis not present

## 2020-02-09 MED FILL — METHOCARBAMOL 500 MG TABS: 500 | 12 days supply | Qty: 45 | Fill #0

## 2020-02-09 MED FILL — OXYCODONE-APAP 5-325MG: 5-325 | 7 days supply | Qty: 30 | Fill #0

## 2020-02-14 MED FILL — AMLODIPINE BESYLATE 10 MG T: 10 | 30 days supply | Qty: 30 | Fill #1

## 2020-02-14 MED FILL — BuPROPion HCL ER (XL) 300 M: 300 | 30 days supply | Qty: 30 | Fill #3

## 2020-02-20 MED FILL — SODIUM FLUORIDE 5000 PPM 1.: 1.1 | 30 days supply | Qty: 100 | Fill #0

## 2020-02-22 MED FILL — METHYLPREDNISOLONE 4 MG TBP: 4 | 6 days supply | Qty: 21 | Fill #0

## 2020-02-22 MED FILL — BUTALB-ACETAMIN-CAFF 50-325: 50-325-40 | 30 days supply | Qty: 60 | Fill #2

## 2020-02-29 MED FILL — clonazePAM 1 MG TABS: 1 | 30 days supply | Qty: 60 | Fill #1

## 2020-02-29 MED FILL — tiZANidine HCL 4 MG TABS: 4 | 20 days supply | Qty: 60 | Fill #0

## 2020-03-05 MED FILL — OXYCODONE-ACETAMINOPHEN 5-3: 5-325 | 7 days supply | Qty: 30 | Fill #0

## 2020-03-07 DIAGNOSIS — G43009 Migraine without aura, not intractable, without status migrainosus: Secondary | ICD-10-CM | POA: Diagnosis not present

## 2020-03-08 MED FILL — TEMAZEPAM 30 MG CAPSULE: 30 | 90 days supply | Qty: 90 | Fill #0

## 2020-03-19 MED FILL — ESOMEPRAZOLE MAG DR 40 MG C: 40 | 90 days supply | Qty: 90 | Fill #0

## 2020-03-19 MED FILL — ESCITALOPRAM 10 MG TABLET: 10 | 30 days supply | Qty: 30 | Fill #2

## 2020-03-19 MED FILL — tiZANidine HCL 4 MG TABS: 4 | 20 days supply | Qty: 60 | Fill #1

## 2020-03-20 MED FILL — buPROPion HCL ER (XL) 300 M: 300 | 90 days supply | Qty: 90 | Fill #0

## 2020-03-25 MED FILL — BUTALB-ACETAMIN-CAFF 50-325: 50-325-40 | 30 days supply | Qty: 60 | Fill #3

## 2020-03-27 MED FILL — BACLOFEN 10 MG TABS: 10 | 90 days supply | Qty: 270 | Fill #1

## 2020-04-03 DIAGNOSIS — G43009 Migraine without aura, not intractable, without status migrainosus: Secondary | ICD-10-CM | POA: Diagnosis not present

## 2020-04-03 DIAGNOSIS — R410 Disorientation, unspecified: Secondary | ICD-10-CM | POA: Diagnosis not present

## 2020-04-03 DIAGNOSIS — F419 Anxiety disorder, unspecified: Secondary | ICD-10-CM | POA: Diagnosis not present

## 2020-04-03 DIAGNOSIS — I10 Essential (primary) hypertension: Secondary | ICD-10-CM | POA: Diagnosis not present

## 2020-04-03 DIAGNOSIS — F331 Major depressive disorder, recurrent, moderate: Secondary | ICD-10-CM | POA: Diagnosis not present

## 2020-04-03 DIAGNOSIS — G4700 Insomnia, unspecified: Secondary | ICD-10-CM | POA: Diagnosis not present

## 2020-04-03 MED FILL — clonazePAM 1 MG TABS: 1 | 30 days supply | Qty: 60 | Fill #2

## 2020-04-29 MED FILL — IRBESARTAN 300 MG TABS: 300 | 90 days supply | Qty: 90 | Fill #1

## 2020-05-07 MED FILL — CLONAZEPAM 1 MG TABS: 1 | 30 days supply | Qty: 60 | Fill #0

## 2020-05-14 MED FILL — cloNIDine HCL 0.2 MG TABS: 0.2 | 90 days supply | Qty: 180 | Fill #0

## 2020-05-28 MED FILL — BUTALB-ACETAMIN-CAFF 50-325: 50-325-40 | 30 days supply | Qty: 60 | Fill #5

## 2020-05-28 MED FILL — ESCITALOPRAM 10 MG TABLET: 10 | 30 days supply | Qty: 30 | Fill #4

## 2020-06-06 ENCOUNTER — Encounter: Payer: Self-pay | Admitting: Adult Health

## 2020-06-06 ENCOUNTER — Ambulatory Visit (INDEPENDENT_AMBULATORY_CARE_PROVIDER_SITE_OTHER): Payer: 59 | Admitting: Adult Health

## 2020-06-06 VITALS — BP 117/83 | HR 74 | Ht 68.0 in | Wt 211.0 lb

## 2020-06-06 DIAGNOSIS — G4700 Insomnia, unspecified: Secondary | ICD-10-CM

## 2020-06-06 DIAGNOSIS — F411 Generalized anxiety disorder: Secondary | ICD-10-CM | POA: Diagnosis not present

## 2020-06-06 DIAGNOSIS — F331 Major depressive disorder, recurrent, moderate: Secondary | ICD-10-CM

## 2020-06-06 NOTE — Progress Notes (Signed)
[facility] MD/PA/NP Initial Note  06/06/2020 12:53 PM Theresa Henry  MRN:  [id number]  Chief Complaint:   HPI:   Describes mood today as "ok". Mood symptoms - reports depression, anxiety, and irritability. More "depressed" overall. Currently taking medications to manage mood symptoms - Clonazepam, Lexapro, and Wellbutrin. Has been on medication psychotropic medications for past 8 years.   Father moved in with her in July - dementia  2020 - resposible for his care. Brother passed away in Dec 10, 2019. Back surgery in February 2021.   Patient notes she was referred to the clinic to see if she might of had a "medication reaction". Discussing an "episode" which occurred in April of this year, after talking with sister in law about her brother. After hanging up from the conversation - "doesn't remember much of anything after that". Stating "I started crying and things went down hill from there".   Husband and daughter concerned that she may have had a reaction from her medications. Stating "I have never had anything like that happen before". Does not overtake or abuse medications  Energy levels lower. More sedentary since working from home. Active, does not have a regular exercise routine. Does go up and down stairs multiple times a day.  Enjoys some usual interests and activities. Married. Lives with husband of 76 years and her father. Husband works as a Administrator. Has one daughter - 92 y/o. Daughter takes care of her father 5 days a week 8 to 2. Spending time with family. Mostly stays at home - "getting out is far and few between".  Appetite adequate. Weight stable - 211 pounds.  Sleeps well most nights. Averages 6 to 7 hours. Focus and concentration stable. Completing tasks. Managing aspects of household. Work going well.  Denies SI or HI. Denies AH or VH.  Previously seen at clinic - Dr Clovis Pu.  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     Previous medication trials: Denies  Visit Diagnosis:    ICD-10-CM   1. Generalized anxiety disorder  F41.1   2. Major depressive disorder, recurrent episode, moderate (HCC)  F33.1   3. Insomnia, unspecified type  G47.00     Past Psychiatric History: Denies any psychiatric admissions.  Past Medical History:  Past Medical History:  Diagnosis Date  . Allergy   . Anxiety   . GERD (gastroesophageal reflux disease)   . Hypertension   . Migraine   . Obesity     Past Surgical History:  Procedure Laterality Date  . ABDOMINAL HYSTERECTOMY     per pt-had vaginal hysterectomy!  Marland Kitchen BREAST REDUCTION SURGERY    . CARPAL TUNNEL RELEASE     right hand done 01/2017/left CTR 12/27/202018  . CERVICAL DISC SURGERY  02/05/2017   c4-c56  . LAPAROSCOPY    . REDUCTION MAMMAPLASTY Bilateral 2001  . TYMPANOSTOMY TUBE PLACEMENT    . WISDOM TOOTH EXTRACTION      Family Psychiatric History: Father (47) has dementia and lives with she and husband.  Family History:  Family History  Problem Relation Age of Onset  . Ovarian cancer Mother   . High Cholesterol Father   . Heart disease Father   . Migraines Brother   . Migraines Paternal Grandfather   . Migraines Daughter   . Breast cancer Maternal Aunt        in 28's or 60's    Social History:  Social History   Socioeconomic History  . Marital  status: Married    Spouse name: Not on file  . Number of children: Not on file  . Years of education: Not on file  . Highest education level: Not on file  Occupational History  . Not on file  Tobacco Use  . Smoking status: Never Smoker  . Smokeless tobacco: Never Used  Substance and Sexual Activity  . Alcohol use: No  . Drug use: No  . Sexual activity: Not on file  Other Topics Concern  . Not on file  Social History Narrative  . Not on file   Social Determinants of Health   Financial Resource Strain:   . Difficulty of Paying Living Expenses:   Food Insecurity:   . Worried About  Charity fundraiser in the Last Year:   . Arboriculturist in the Last Year:   Transportation Needs:   . Film/video editor (Medical):   Marland Kitchen Lack of Transportation (Non-Medical):   Physical Activity:   . Days of Exercise per Week:   . Minutes of Exercise per Session:   Stress:   . Feeling of Stress :   Social Connections:   . Frequency of Communication with Friends and Family:   . Frequency of Social Gatherings with Friends and Family:   . Attends Religious Services:   . Active Member of Clubs or Organizations:   . Attends Archivist Meetings:   Marland Kitchen Marital Status:     Allergies:  Allergies  Allergen Reactions  . Nitrous Oxide Swelling and Other (See Comments)  . Diphenhydramine Other (See Comments)    Makes hyperactive Makes hyperactive  . Sulfa Antibiotics     Causes nausea  . Sulfamethoxazole-Trimethoprim Other (See Comments)    Other reaction(s): GI Upset (intolerance) Other reaction(s): GI Upset (intolerance) Other reaction(s): GI Upset (intolerance)   . Amoxicillin Rash  . Amoxicillin-Pot Clavulanate Rash  . Band-Aid Friction Block [Foot Care Products]     Regular band-aids cause redness and itching  . Erythromycin Base Other (See Comments)  . Penicillins Rash    Metabolic Disorder Labs: No results found for: HGBA1C, MPG No results found for: PROLACTIN No results found for: CHOL, TRIG, HDL, CHOLHDL, VLDL, LDLCALC Lab Results  Component Value Date   TSH 4.193 04/26/2017    Therapeutic Level Labs: No results found for: LITHIUM No results found for: VALPROATE No components found for:  CBMZ  Current Medications: Current Outpatient Medications  Medication Sig Dispense Refill  . clonazePAM (KLONOPIN) 1 MG tablet clonazepam 1 mg tablet    . baclofen (LIORESAL) 10 MG tablet Take 10 mg by mouth as needed.     Marland Kitchen buPROPion (WELLBUTRIN XL) 150 MG 24 hr tablet Take 150 mg by mouth daily.    . butalbital-acetaminophen-caffeine (FIORICET, ESGIC) 50-325-40  MG per tablet Take 1 tablet by mouth 2 (two) times daily as needed for headache (headache).     . cetirizine (ZYRTEC) 10 MG tablet Take 10 mg by mouth at bedtime.    Marland Kitchen esomeprazole (NEXIUM) 40 MG capsule Take 40 mg by mouth daily at 6 (six) AM.     . furosemide (LASIX) 20 MG tablet Take 1 tablet (20 mg total) by mouth daily as needed. (Patient not taking: Reported on 04/30/2017) 30 tablet 0  . hydrocortisone ointment 0.5 % Apply 1 application topically 2 (two) times daily.    . irbesartan (AVAPRO) 300 MG tablet Take 300 mg by mouth daily.    Marland Kitchen MINIVELLE 0.075 MG/24HR Place 1 patch onto  the skin 2 (two) times a week.  12  . promethazine (PHENERGAN) 25 MG tablet Take 25 mg by mouth every 6 (six) hours as needed for nausea (nausea).     . ranitidine (ZANTAC) 150 MG tablet Take 150 mg by mouth as needed for heartburn.    . temazepam (RESTORIL) 30 MG capsule Take 30 mg by mouth at bedtime.  1  . tiZANidine (ZANAFLEX) 4 MG tablet Take 4 mg by mouth every 8 (eight) hours as needed.    . Vitamin E 100 UNIT/GM CREA Apply 1 g topically daily.     Current Facility-Administered Medications  Medication Dose Route Frequency Provider Last Rate Last Admin  . 0.9 %  sodium chloride infusion  500 mL Intravenous Once Ladene Artist, MD        Medication Side Effects: none  Orders placed this visit:  No orders of the defined types were placed in this encounter.   Psychiatric Specialty Exam:  Review of Systems  Musculoskeletal: Negative for gait problem.  Neurological: Negative for tremors.  Psychiatric/Behavioral:       Please refer to HPI    Blood pressure 117/83, pulse 74, height 5\' 8"  (1.727 m), weight 211 lb (95.7 kg).Body mass index is 32.08 kg/m.  General Appearance: Casual, Neat and Well Groomed  Eye Contact:  Good  Speech:  Clear and Coherent and Normal Rate  Volume:  Normal  Mood:  Anxious and Depressed  Affect:  Appropriate and Congruent  Thought Process:  Coherent and Descriptions of  Associations: Intact  Orientation:  Full (Time, Place, and Person)  Thought Content: Logical   Suicidal Thoughts:  No  Homicidal Thoughts:  No  Memory:  WNL  Judgement:  Good  Insight:  Good  Psychomotor Activity:  Normal  Concentration:  Concentration: Good  Recall:  Good  Fund of Knowledge: Good  Language: Good  Assets:  Communication Skills Desire for Improvement Financial Resources/Insurance Housing Intimacy Leisure Time Physical Health Resilience Social Support Talents/Skills Transportation Vocational/Educational  ADL's:  Intact  Cognition: WNL  Prognosis:  Good   Screenings: None  Receiving Psychotherapy: No   Treatment Plan/Recommendations:   Plan:  PDMP reviewed  1. Lexapro 10mg  daily 2. Wellbutrin XL 300mg  daily 3. Clonazepam 1mg  BID - usually takes one a day  Review notes from PCP. Concerns Wellbutrin may be over activating and causing mood instability. Denies history of seizures.   Will review chart history - patient seen at [facility] previously.  Consider d/c Wellbutrin with mood instability and consider a mood stabilizer  Refer to therapy  RTC 4 weeks  Patient advised to contact office with any questions, adverse effects, or acute worsening in signs and symptoms.  Discussed potential benefits, risk, and side effects of benzodiazepines to include potential risk of tolerance and dependence, as well as possible drowsiness.  Advised patient not to drive if experiencing drowsiness and to take lowest possible effective dose to minimize risk of dependence and tolerance.   Aloha Gell, NP

## 2020-06-08 MED FILL — TEMAZEPAM 30 MG CAPSULE: 30 | 90 days supply | Qty: 90 | Fill #0

## 2020-06-10 MED FILL — clonazePAM 1 MG TABS: 1 | 30 days supply | Qty: 60 | Fill #1

## 2020-06-19 MED FILL — buPROPion HCL ER (XL) 300 M: 300 | 90 days supply | Qty: 90 | Fill #0

## 2020-06-19 MED FILL — ESOMEPRAZOLE MAG DR 40 MG C: 40 | 90 days supply | Qty: 90 | Fill #0

## 2020-06-27 ENCOUNTER — Other Ambulatory Visit (HOSPITAL_COMMUNITY): Payer: Self-pay | Admitting: Family Medicine

## 2020-06-28 MED FILL — BUTALB-ACETAMIN-CAFF 50-325: 50-325-40 | 8 days supply | Qty: 60 | Fill #0

## 2020-06-28 MED FILL — BACLOFEN 10 MG TABS: 10 | 30 days supply | Qty: 90 | Fill #0

## 2020-07-02 DIAGNOSIS — F331 Major depressive disorder, recurrent, moderate: Secondary | ICD-10-CM | POA: Diagnosis not present

## 2020-07-02 DIAGNOSIS — G43009 Migraine without aura, not intractable, without status migrainosus: Secondary | ICD-10-CM | POA: Diagnosis not present

## 2020-07-02 DIAGNOSIS — K219 Gastro-esophageal reflux disease without esophagitis: Secondary | ICD-10-CM | POA: Diagnosis not present

## 2020-07-02 DIAGNOSIS — E559 Vitamin D deficiency, unspecified: Secondary | ICD-10-CM | POA: Diagnosis not present

## 2020-07-02 DIAGNOSIS — M545 Low back pain: Secondary | ICD-10-CM | POA: Diagnosis not present

## 2020-07-02 DIAGNOSIS — F419 Anxiety disorder, unspecified: Secondary | ICD-10-CM | POA: Diagnosis not present

## 2020-07-02 DIAGNOSIS — I10 Essential (primary) hypertension: Secondary | ICD-10-CM | POA: Diagnosis not present

## 2020-07-02 DIAGNOSIS — R7303 Prediabetes: Secondary | ICD-10-CM | POA: Diagnosis not present

## 2020-07-02 DIAGNOSIS — G8929 Other chronic pain: Secondary | ICD-10-CM | POA: Diagnosis not present

## 2020-07-04 ENCOUNTER — Ambulatory Visit (INDEPENDENT_AMBULATORY_CARE_PROVIDER_SITE_OTHER): Payer: 59 | Admitting: Adult Health

## 2020-07-04 ENCOUNTER — Other Ambulatory Visit: Payer: Self-pay

## 2020-07-04 ENCOUNTER — Encounter: Payer: Self-pay | Admitting: Adult Health

## 2020-07-04 DIAGNOSIS — F331 Major depressive disorder, recurrent, moderate: Secondary | ICD-10-CM | POA: Diagnosis not present

## 2020-07-04 DIAGNOSIS — G4700 Insomnia, unspecified: Secondary | ICD-10-CM

## 2020-07-04 DIAGNOSIS — F411 Generalized anxiety disorder: Secondary | ICD-10-CM

## 2020-07-04 NOTE — Progress Notes (Signed)
Theresa Henry [id number] 10-12-1966 54 y.o.  Subjective:   Patient ID:  Theresa Henry is a 54 y.o. (DOB 07-31-1966) female.  Chief Complaint: No chief complaint on file.   HPI Theresa Henry presents to the office today for follow-up of GAD, MDD, and insomnia.  Describes mood today as "ok". Mood symptoms - reports decreased depression, anxiety, and irritability. Stating "I am doing good". Denies any mood instability since last visit.Trying to get out of the house more. Got out over the weekend. Bought some items to complete some projects at home. Feels like current medications are working to manage mood symptoms - Clonazepam, Lexapro, and Wellbutrin. Has been on medication psychotropic medications for past 8 years. Improved interest and motivation.  Energy levels improved. Active, does not have a regular exercise routine.  Enjoys some usual interests and activities. Married. Lives with husband of 39 years and her father. Husband works as a Administrator. Has one daughter - 55 y/o.  Appetite adequate. Weight stable. Sleeps well most nights. Averages 6 to 7 hours. Focus and concentration stable. Completing tasks. Managing aspects of household. Work going well. 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  Denies SI or HI. Denies AH or VH.  Previously seen at clinic - Dr Clovis Pu.  Review of Systems:  Review of Systems  Musculoskeletal: Negative for gait problem.  Neurological: Negative for tremors.  Psychiatric/Behavioral:       Please refer to HPI    Medications: I have reviewed the patient's current medications.  Current Outpatient Medications  Medication Sig Dispense Refill  . baclofen (LIORESAL) 10 MG tablet Take 10 mg by mouth as needed.     Marland Kitchen buPROPion (WELLBUTRIN XL) 150 MG 24 hr tablet Take 150 mg by mouth daily.    . butalbital-acetaminophen-caffeine (FIORICET, ESGIC) 50-325-40 MG per tablet Take 1 tablet by mouth 2 (two) times  daily as needed for headache (headache).     . cetirizine (ZYRTEC) 10 MG tablet Take 10 mg by mouth at bedtime.    . clonazePAM (KLONOPIN) 1 MG tablet clonazepam 1 mg tablet    . esomeprazole (NEXIUM) 40 MG capsule Take 40 mg by mouth daily at 6 (six) AM.     . furosemide (LASIX) 20 MG tablet Take 1 tablet (20 mg total) by mouth daily as needed. (Patient not taking: Reported on 04/30/2017) 30 tablet 0  . hydrocortisone ointment 0.5 % Apply 1 application topically 2 (two) times daily.    . irbesartan (AVAPRO) 300 MG tablet Take 300 mg by mouth daily.    Marland Kitchen MINIVELLE 0.075 MG/24HR Place 1 patch onto the skin 2 (two) times a week.  12  . promethazine (PHENERGAN) 25 MG tablet Take 25 mg by mouth every 6 (six) hours as needed for nausea (nausea).     . ranitidine (ZANTAC) 150 MG tablet Take 150 mg by mouth as needed for heartburn.    . temazepam (RESTORIL) 30 MG capsule Take 30 mg by mouth at bedtime.  1  . tiZANidine (ZANAFLEX) 4 MG tablet Take 4 mg by mouth every 8 (eight) hours as needed.    . Vitamin E 100 UNIT/GM CREA Apply 1 g topically daily.     Current Facility-Administered Medications  Medication Dose Route Frequency Provider Last Rate Last Admin  . 0.9 %  sodium chloride infusion  500 mL Intravenous Once Ladene Artist, MD        Medication Side Effects: None  Allergies:  Allergies  Allergen Reactions  . Nitrous Oxide Swelling and Other (See Comments)  . Diphenhydramine Other (See Comments)    Makes hyperactive Makes hyperactive  . Sulfa Antibiotics     Causes nausea  . Sulfamethoxazole-Trimethoprim Other (See Comments)    Other reaction(s): GI Upset (intolerance) Other reaction(s): GI Upset (intolerance) Other reaction(s): GI Upset (intolerance)   . Amoxicillin Rash  . Amoxicillin-Pot Clavulanate Rash  . Band-Aid Friction Block [Foot Care Products]     Regular band-aids cause redness and itching  . Erythromycin Base Other (See Comments)  . Penicillins Rash    Past  Medical History:  Diagnosis Date  . Allergy   . Anxiety   . GERD (gastroesophageal reflux disease)   . Hypertension   . Migraine   . Obesity     Family History  Problem Relation Age of Onset  . Ovarian cancer Mother   . High Cholesterol Father   . Heart disease Father   . Migraines Brother   . Migraines Paternal Grandfather   . Migraines Daughter   . Breast cancer Maternal Aunt        in 23's or 50's    Social History   Socioeconomic History  . Marital status: Married    Spouse name: Not on file  . Number of children: Not on file  . Years of education: Not on file  . Highest education level: Not on file  Occupational History  . Not on file  Tobacco Use  . Smoking status: Never Smoker  . Smokeless tobacco: Never Used  Substance and Sexual Activity  . Alcohol use: No  . Drug use: No  . Sexual activity: Not on file  Other Topics Concern  . Not on file  Social History Narrative  . Not on file   Social Determinants of Health   Financial Resource Strain:   . Difficulty of Paying Living Expenses:   Food Insecurity:   . Worried About Charity fundraiser in the Last Year:   . Arboriculturist in the Last Year:   Transportation Needs:   . Film/video editor (Medical):   Marland Kitchen Lack of Transportation (Non-Medical):   Physical Activity:   . Days of Exercise per Week:   . Minutes of Exercise per Session:   Stress:   . Feeling of Stress :   Social Connections:   . Frequency of Communication with Friends and Family:   . Frequency of Social Gatherings with Friends and Family:   . Attends Religious Services:   . Active Member of Clubs or Organizations:   . Attends Archivist Meetings:   Marland Kitchen Marital Status:   Intimate Partner Violence:   . Fear of Current or Ex-Partner:   . Emotionally Abused:   Marland Kitchen Physically Abused:   . Sexually Abused:     Past Medical History, Surgical history, Social history, and Family history were reviewed and updated as appropriate.    Please see review of systems for further details on the patient's review from today.   Objective:   Physical Exam:  There were no vitals taken for this visit.  Physical Exam Constitutional:      General: She is not in acute distress. Musculoskeletal:        General: No deformity.  Neurological:     Mental Status: She is alert and oriented to person, place, and time.     Coordination: Coordination normal.  Psychiatric:        Attention  and Perception: Attention and perception normal. She does not perceive auditory or visual hallucinations.        Mood and Affect: Mood normal. Mood is not anxious or depressed. Affect is not labile, blunt, angry or inappropriate.        Speech: Speech normal.        Behavior: Behavior normal.        Thought Content: Thought content normal. Thought content is not paranoid or delusional. Thought content does not include homicidal or suicidal ideation. Thought content does not include homicidal or suicidal plan.        Cognition and Memory: Cognition and memory normal.        Judgment: Judgment normal.     Comments: Insight intact     Lab Review:     Component Value Date/Time   NA 139 04/28/2017 0646   K 3.8 04/28/2017 0646   CL 105 04/28/2017 0646   CO2 24 04/28/2017 0646   GLUCOSE 120 (H) 04/28/2017 0646   BUN 7 04/28/2017 0646   CREATININE 0.80 04/28/2017 0646   CALCIUM 8.7 (L) 04/28/2017 0646   PROT 6.5 04/28/2017 0646   ALBUMIN 3.6 04/28/2017 0646   AST 38 04/28/2017 0646   ALT 56 (H) 04/28/2017 0646   ALKPHOS 71 04/28/2017 0646   BILITOT 0.2 (L) 04/28/2017 0646   GFRNONAA >60 04/28/2017 0646   GFRAA >60 04/28/2017 0646       Component Value Date/Time   WBC 6.3 04/28/2017 0646   RBC 4.56 04/28/2017 0646   HGB 12.8 04/28/2017 0646   HCT 39.6 04/28/2017 0646   PLT 203 04/28/2017 0646   MCV 86.8 04/28/2017 0646   MCH 28.1 04/28/2017 0646   MCHC 32.3 04/28/2017 0646   RDW 14.8 04/28/2017 0646   LYMPHSABS 2.6 02/19/2014 1316    MONOABS 0.5 02/19/2014 1316   EOSABS 0.1 02/19/2014 1316   BASOSABS 0.0 02/19/2014 1316    No results found for: POCLITH, LITHIUM   No results found for: PHENYTOIN, PHENOBARB, VALPROATE, CBMZ   .res Assessment: Plan:     Treatment Plan/Recommendations:   Plan:  PDMP reviewed  1. Lexapro 10mg  daily 2. Wellbutrin XL 300mg  daily 3. Clonazepam 1mg  BID - usually takes one a day  Refer to therapy  RTC as needed.   Patient advised to contact office with any questions, adverse effects, or acute worsening in signs and symptoms.  Discussed potential benefits, risk, and side effects of benzodiazepines to include potential risk of tolerance and dependence, as well as possible drowsiness.  Advised patient not to drive if experiencing drowsiness and to take lowest possible effective dose to minimize risk of dependence and tolerance.   Diagnoses and all orders for this visit:  Insomnia, unspecified type  Major depressive disorder, recurrent episode, moderate ([place])  Generalized anxiety disorder     Please see After Visit Summary for patient specific instructions.  No future appointments.  No orders of the defined types were placed in this encounter.   -------------------------------

## 2020-07-09 MED FILL — clonazePAM 1 MG TABS: 1 | 30 days supply | Qty: 60 | Fill #2

## 2020-07-15 MED FILL — ESCITALOPRAM 10 MG TABLET: 10 | 30 days supply | Qty: 30 | Fill #5

## 2020-07-29 ENCOUNTER — Other Ambulatory Visit (HOSPITAL_COMMUNITY): Payer: Self-pay | Admitting: Family Medicine

## 2020-07-29 MED FILL — BUTALB-ACETAMIN-CAFF 50-325: 50-325-40 | 8 days supply | Qty: 60 | Fill #1

## 2020-07-29 MED FILL — AMLODIPINE BESYLATE 10 MG T: 10 | 90 days supply | Qty: 90 | Fill #0

## 2020-07-29 MED FILL — BACLOFEN 10 MG TABS: 10 | 30 days supply | Qty: 90 | Fill #1

## 2020-08-07 ENCOUNTER — Other Ambulatory Visit (HOSPITAL_COMMUNITY): Payer: Self-pay | Admitting: Family Medicine

## 2020-08-07 MED FILL — IRBESARTAN 300 MG TAB: 300 | 90 days supply | Qty: 90 | Fill #0

## 2020-08-08 ENCOUNTER — Ambulatory Visit: Payer: 59 | Admitting: Psychiatry

## 2020-08-12 MED FILL — cloNIDine HCL 0.2 MG TABS: 0.2 | 90 days supply | Qty: 180 | Fill #1

## 2020-08-13 ENCOUNTER — Other Ambulatory Visit (HOSPITAL_COMMUNITY): Payer: Self-pay | Admitting: Family Medicine

## 2020-08-13 DIAGNOSIS — H5213 Myopia, bilateral: Secondary | ICD-10-CM | POA: Diagnosis not present

## 2020-08-13 DIAGNOSIS — H524 Presbyopia: Secondary | ICD-10-CM | POA: Diagnosis not present

## 2020-08-16 MED FILL — ESCITALOPRAM 10 MG TABLET: 10 | 30 days supply | Qty: 30 | Fill #0

## 2020-08-29 MED FILL — CLONAZEPAM 1 MG TABS: 1 | 30 days supply | Qty: 60 | Fill #0

## 2020-08-29 MED FILL — BUTALB-ACETAMIN-CAFF 50-325: 50-325-40 | 8 days supply | Qty: 60 | Fill #2

## 2020-08-29 MED FILL — BACLOFEN 10 MG TABS: 10 | 30 days supply | Qty: 90 | Fill #2

## 2020-09-04 MED FILL — ESTRADIOL 0.075 MG/24HR PTT: 0.075 | 84 days supply | Qty: 24 | Fill #0

## 2020-09-05 MED FILL — TEMAZEPAM 30 MG CAPSULE: 30 | 90 days supply | Qty: 90 | Fill #1

## 2020-09-16 MED FILL — ESCITALOPRAM 10 MG TABLET: 10 | 30 days supply | Qty: 30 | Fill #1

## 2020-09-23 ENCOUNTER — Other Ambulatory Visit (HOSPITAL_COMMUNITY): Payer: Self-pay | Admitting: Family Medicine

## 2020-09-24 MED FILL — ESOMEPRAZOLE MAG DR 40 MG C: 40 | 90 days supply | Qty: 90 | Fill #0

## 2020-09-28 MED FILL — BACLOFEN 10 MG TABS: 10 | 30 days supply | Qty: 90 | Fill #3

## 2020-09-28 MED FILL — BUTALB-ACETAMIN-CAFF 50-325: 50-325-40 | 8 days supply | Qty: 60 | Fill #3

## 2020-09-28 MED FILL — CLONAZEPAM 1 MG TABS: 1 | 30 days supply | Qty: 60 | Fill #1

## 2020-09-30 ENCOUNTER — Telehealth: Payer: Self-pay | Admitting: Adult Health

## 2020-09-30 NOTE — Telephone Encounter (Signed)
She had initial episode prior to first visit here. Has been on the same meds for the past 8 years. Could be some type of seizure activity. Let's have her reduce dose of Wellbutrin to 150mg  daily and leave off. She may need something else to manage mood symptoms, but we can at least rule the Wellbutrin out.

## 2020-09-30 NOTE — Telephone Encounter (Signed)
I do not schedule apts but I will forward this to front to get it scheduled asap

## 2020-09-30 NOTE — Telephone Encounter (Signed)
Understood. Can we get her in this week?

## 2020-09-30 NOTE — Telephone Encounter (Signed)
Patient called back and received the message about tapering off of Wellbutrin, she is concerned about doing that without replacing it with something. She feels that her episodes are anxiety related.  Requesting to get in for an apt this week or at least talk to you over the phone?

## 2020-09-30 NOTE — Telephone Encounter (Signed)
Rtc to patient and she reports having another "episode" like she did after the first visit with you Theresa Henry), she blacked out last night her family called the police but she was not taken anywhere. She sounds and feels fine today. This is the 2nd episode she's had. No medical issues or blood pressure issues. Feels it's something with her medication. Her first visit was 06/06/2020.

## 2020-09-30 NOTE — Telephone Encounter (Signed)
Noted  

## 2020-09-30 NOTE — Telephone Encounter (Signed)
Tried to reach patient again but had to leave voicemail with instructions and to call back with further concerns.

## 2020-09-30 NOTE — Telephone Encounter (Signed)
Pt stated she is having a crisis wanted an appt none available. Would like a call asap,and will come in anytime today if you will see her. Pt advised to go to ER or Urgent [facility], but declined. Stated she is not suicidal.

## 2020-10-01 NOTE — Telephone Encounter (Signed)
Scheduled pt this week.

## 2020-10-02 ENCOUNTER — Telehealth (INDEPENDENT_AMBULATORY_CARE_PROVIDER_SITE_OTHER): Payer: 59 | Admitting: Adult Health

## 2020-10-02 ENCOUNTER — Encounter: Payer: Self-pay | Admitting: Adult Health

## 2020-10-02 DIAGNOSIS — F411 Generalized anxiety disorder: Secondary | ICD-10-CM | POA: Diagnosis not present

## 2020-10-02 DIAGNOSIS — F331 Major depressive disorder, recurrent, moderate: Secondary | ICD-10-CM | POA: Diagnosis not present

## 2020-10-02 DIAGNOSIS — G4700 Insomnia, unspecified: Secondary | ICD-10-CM

## 2020-10-02 NOTE — Progress Notes (Signed)
Theresa Henry [id number] Nov 20, 1966 54 y.o.  Virtual Visit via Video Note  I connected with pt @ on 10/02/20 at  4:00 PM EDT by a video enabled telemedicine application and verified that I am speaking with the correct person using two identifiers.   I discussed the limitations of evaluation and management by telemedicine and the availability of in person appointments. The patient expressed understanding and agreed to proceed.  I discussed the assessment and treatment plan with the patient. The patient was provided an opportunity to ask questions and all were answered. The patient agreed with the plan and demonstrated an understanding of the instructions.   The patient was advised to call back or seek an in-person evaluation if the symptoms worsen or if the condition fails to improve as anticipated.  I provided 30 minutes of non-face-to-face time during this encounter.  The patient was located at home.  The provider was located at [place].   Aloha Gell, NP   Subjective:   Patient ID:  Theresa Henry is a 54 y.o. (DOB 01/18/1966) female.  Chief Complaint: No chief complaint on file.   HPI Theresa Henry presents for follow-up of GAD, MDD, and insomnia.  Describes mood today as "ok". Pleasant. Mood symptoms - reports decreased depression, anxiety, and irritability. Stating "I' had been doing ok I think". Had another "incident" that she doesn't remember - went out in the garage and sat on the bow flex, then the motorcycle, then tried to urinate on the coffee table. This was her second episode - the first episode was in April. Episodes related to higher levels of stress. Has taken the rest of this week off and would like to take next week off to try and do some self care for herself. Plans to get out of the house more. Feeling "better" since the incident. Family worried about her and have been "all over me". Her friend thinks she sounds "fine". Feels like current  medications are working to manage mood symptoms - Clonazepam, Lexapro, and Wellbutrin. Has been on medication psychotropic medications for past 8 years. Improved interest and motivation.  Energy levels improved. Active, does not have a regular exercise routine.  Enjoys some usual interests and activities. Married. Lives with husband of 62 years and her father. Has one daughter - 64 y/o.  Appetite adequate. Weight stable. Sleeps well most nights. Averages 6 to 7 hours. Focus and concentration stable. Completing tasks. Managing aspects of household. Work going well. 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  Denies SI or HI. Denies AH or VH.  Previously seen at clinic - Dr Clovis Pu.  Review of Systems:  Review of Systems  Musculoskeletal: Negative for gait problem.  Neurological: Negative for tremors.  Psychiatric/Behavioral:       Please refer to HPI    Medications: I have reviewed the patient's current medications.  Current Outpatient Medications  Medication Sig Dispense Refill  . baclofen (LIORESAL) 10 MG tablet Take 10 mg by mouth as needed.     Marland Kitchen buPROPion (WELLBUTRIN XL) 150 MG 24 hr tablet Take 150 mg by mouth daily.    . butalbital-acetaminophen-caffeine (FIORICET, ESGIC) 50-325-40 MG per tablet Take 1 tablet by mouth 2 (two) times daily as needed for headache (headache).     . cetirizine (ZYRTEC) 10 MG tablet Take 10 mg by mouth at bedtime.    . clonazePAM (KLONOPIN) 1 MG tablet clonazepam 1 mg tablet    .  esomeprazole (NEXIUM) 40 MG capsule Take 40 mg by mouth daily at 6 (six) AM.     . furosemide (LASIX) 20 MG tablet Take 1 tablet (20 mg total) by mouth daily as needed. (Patient not taking: Reported on 04/30/2017) 30 tablet 0  . hydrocortisone ointment 0.5 % Apply 1 application topically 2 (two) times daily.    . irbesartan (AVAPRO) 300 MG tablet Take 300 mg by mouth daily.    Marland Kitchen MINIVELLE 0.075 MG/24HR Place 1 patch onto the skin 2 (two)  times a week.  12  . promethazine (PHENERGAN) 25 MG tablet Take 25 mg by mouth every 6 (six) hours as needed for nausea (nausea).     . ranitidine (ZANTAC) 150 MG tablet Take 150 mg by mouth as needed for heartburn.    . temazepam (RESTORIL) 30 MG capsule Take 30 mg by mouth at bedtime.  1  . tiZANidine (ZANAFLEX) 4 MG tablet Take 4 mg by mouth every 8 (eight) hours as needed.    . Vitamin E 100 UNIT/GM CREA Apply 1 g topically daily.     Current Facility-Administered Medications  Medication Dose Route Frequency Provider Last Rate Last Admin  . 0.9 %  sodium chloride infusion  500 mL Intravenous Once Ladene Artist, MD        Medication Side Effects: None  Allergies:  Allergies  Allergen Reactions  . Nitrous Oxide Swelling and Other (See Comments)  . Diphenhydramine Other (See Comments)    Makes hyperactive Makes hyperactive  . Sulfa Antibiotics     Causes nausea  . Sulfamethoxazole-Trimethoprim Other (See Comments)    Other reaction(s): GI Upset (intolerance) Other reaction(s): GI Upset (intolerance) Other reaction(s): GI Upset (intolerance)   . Amoxicillin Rash  . Amoxicillin-Pot Clavulanate Rash  . Band-Aid Friction Block [Foot Care Products]     Regular band-aids cause redness and itching  . Erythromycin Base Other (See Comments)  . Penicillins Rash    Past Medical History:  Diagnosis Date  . Allergy   . Anxiety   . GERD (gastroesophageal reflux disease)   . Hypertension   . Migraine   . Obesity     Family History  Problem Relation Age of Onset  . Ovarian cancer Mother   . High Cholesterol Father   . Heart disease Father   . Migraines Brother   . Migraines Paternal Grandfather   . Migraines Daughter   . Breast cancer Maternal Aunt        in 18's or 77's    Social History   Socioeconomic History  . Marital status: Married    Spouse name: Not on file  . Number of children: Not on file  . Years of education: Not on file  . Highest education level:  Not on file  Occupational History  . Not on file  Tobacco Use  . Smoking status: Never Smoker  . Smokeless tobacco: Never Used  Substance and Sexual Activity  . Alcohol use: No  . Drug use: No  . Sexual activity: Not on file  Other Topics Concern  . Not on file  Social History Narrative  . Not on file   Social Determinants of Health   Financial Resource Strain:   . Difficulty of Paying Living Expenses: Not on file  Food Insecurity:   . Worried About Charity fundraiser in the Last Year: Not on file  . Ran Out of Food in the Last Year: Not on file  Transportation Needs:   .  Lack of Transportation (Medical): Not on file  . Lack of Transportation (Non-Medical): Not on file  Physical Activity:   . Days of Exercise per Week: Not on file  . Minutes of Exercise per Session: Not on file  Stress:   . Feeling of Stress : Not on file  Social Connections:   . Frequency of Communication with Friends and Family: Not on file  . Frequency of Social Gatherings with Friends and Family: Not on file  . Attends Religious Services: Not on file  . Active Member of Clubs or Organizations: Not on file  . Attends Archivist Meetings: Not on file  . Marital Status: Not on file  Intimate Partner Violence:   . Fear of Current or Ex-Partner: Not on file  . Emotionally Abused: Not on file  . Physically Abused: Not on file  . Sexually Abused: Not on file    Past Medical History, Surgical history, Social history, and Family history were reviewed and updated as appropriate.   Please see review of systems for further details on the patient's review from today.   Objective:   Physical Exam:  There were no vitals taken for this visit.  Physical Exam Neurological:     Mental Status: She is alert and oriented to person, place, and time.     Cranial Nerves: No dysarthria.  Psychiatric:        Attention and Perception: Attention and perception normal.        Mood and Affect: Mood normal.         Speech: Speech normal.        Behavior: Behavior is cooperative.        Thought Content: Thought content normal. Thought content is not paranoid or delusional. Thought content does not include homicidal or suicidal ideation. Thought content does not include homicidal or suicidal plan.        Cognition and Memory: Cognition and memory normal.        Judgment: Judgment normal.     Comments: Insight intact     Lab Review:     Component Value Date/Time   NA 139 04/28/2017 0646   K 3.8 04/28/2017 0646   CL 105 04/28/2017 0646   CO2 24 04/28/2017 0646   GLUCOSE 120 (H) 04/28/2017 0646   BUN 7 04/28/2017 0646   CREATININE 0.80 04/28/2017 0646   CALCIUM 8.7 (L) 04/28/2017 0646   PROT 6.5 04/28/2017 0646   ALBUMIN 3.6 04/28/2017 0646   AST 38 04/28/2017 0646   ALT 56 (H) 04/28/2017 0646   ALKPHOS 71 04/28/2017 0646   BILITOT 0.2 (L) 04/28/2017 0646   GFRNONAA >60 04/28/2017 0646   GFRAA >60 04/28/2017 0646       Component Value Date/Time   WBC 6.3 04/28/2017 0646   RBC 4.56 04/28/2017 0646   HGB 12.8 04/28/2017 0646   HCT 39.6 04/28/2017 0646   PLT 203 04/28/2017 0646   MCV 86.8 04/28/2017 0646   MCH 28.1 04/28/2017 0646   MCHC 32.3 04/28/2017 0646   RDW 14.8 04/28/2017 0646   LYMPHSABS 2.6 02/19/2014 1316   MONOABS 0.5 02/19/2014 1316   EOSABS 0.1 02/19/2014 1316   BASOSABS 0.0 02/19/2014 1316    No results found for: POCLITH, LITHIUM   No results found for: PHENYTOIN, PHENOBARB, VALPROATE, CBMZ   .res Assessment: Plan:    Plan:  PDMP reviewed  1. Lexapro 10mg  daily 2. Wellbutrin XL 300mg  daily - denies seizure history 3. Clonazepam 1mg  BID -  usually takes one a day  Refer to therapy - disassociation - 2 occurences.  Will keep patient out of work the rest of the week and the following week.   RTC as needed.   Patient advised to contact office with any questions, adverse effects, or acute worsening in signs and symptoms.  Discussed potential  benefits, risk, and side effects of benzodiazepines to include potential risk of tolerance and dependence, as well as possible drowsiness.  Advised patient not to drive if experiencing drowsiness and to take lowest possible effective dose to minimize risk of dependence and tolerance.   Diagnoses and all orders for this visit:  Insomnia, unspecified type  Major depressive disorder, recurrent episode, moderate ([place])  Generalized anxiety disorder     Please see After Visit Summary for patient specific instructions.  No future appointments.  No orders of the defined types were placed in this encounter.     -------------------------------

## 2020-10-03 ENCOUNTER — Telehealth: Payer: Self-pay | Admitting: Adult Health

## 2020-10-03 ENCOUNTER — Other Ambulatory Visit: Payer: Self-pay | Admitting: Adult Health

## 2020-10-03 DIAGNOSIS — F331 Major depressive disorder, recurrent, moderate: Secondary | ICD-10-CM

## 2020-10-03 MED ORDER — BUPROPION HCL ER (XL) 150 MG PO TB24: 150.0000 mg | ORAL_TABLET | Freq: Every day | ORAL | 0 refills | Status: DC

## 2020-10-03 NOTE — Telephone Encounter (Signed)
Noted  

## 2020-10-03 NOTE — Telephone Encounter (Signed)
Called and spoke with patient - will taper off Wellbutrin XL300mg  - 150mg  daily x 7 days then d/c. Will also refer to Neurology. Plans to call PCP and set up an appointment to discuss migraine treatment.

## 2020-10-03 NOTE — Telephone Encounter (Signed)
Pt is just checking w/you to follow up on visit yesterday.

## 2020-10-04 ENCOUNTER — Telehealth: Payer: Self-pay | Admitting: Adult Health

## 2020-10-04 NOTE — Telephone Encounter (Signed)
Theresa Henry called to update her pharmacy list that is in her chart. She said the only one she uses is [facility]. Please delete all the other pharmacies in the system.

## 2020-10-04 NOTE — Telephone Encounter (Signed)
All other pharmacies deleted from her profile except [facility] outpatient pharmacy

## 2020-10-04 NOTE — Telephone Encounter (Signed)
Reviewed thanks! 

## 2020-10-07 ENCOUNTER — Ambulatory Visit: Payer: 59 | Admitting: Adult Health

## 2020-10-11 ENCOUNTER — Other Ambulatory Visit (HOSPITAL_COMMUNITY): Payer: Self-pay | Admitting: Family Medicine

## 2020-10-11 DIAGNOSIS — F331 Major depressive disorder, recurrent, moderate: Secondary | ICD-10-CM | POA: Diagnosis not present

## 2020-10-11 DIAGNOSIS — G43009 Migraine without aura, not intractable, without status migrainosus: Secondary | ICD-10-CM | POA: Diagnosis not present

## 2020-10-11 DIAGNOSIS — F449 Dissociative and conversion disorder, unspecified: Secondary | ICD-10-CM | POA: Diagnosis not present

## 2020-10-11 DIAGNOSIS — I10 Essential (primary) hypertension: Secondary | ICD-10-CM | POA: Diagnosis not present

## 2020-10-11 MED FILL — PROMETHAZINE 25 MG TABLET: 25 | 30 days supply | Qty: 60 | Fill #0

## 2020-10-16 DIAGNOSIS — Z20822 Contact with and (suspected) exposure to covid-19: Secondary | ICD-10-CM | POA: Diagnosis not present

## 2020-10-16 MED FILL — ESCITALOPRAM 10 MG TABLET: 10 | 30 days supply | Qty: 30 | Fill #2

## 2020-10-29 ENCOUNTER — Other Ambulatory Visit (HOSPITAL_COMMUNITY): Payer: Self-pay | Admitting: Family Medicine

## 2020-10-29 MED FILL — AMLODIPINE BESYLATE 10 MG T: 10 | 90 days supply | Qty: 90 | Fill #1

## 2020-10-29 MED FILL — CLONAZEPAM 1 MG TABS: 1 | 30 days supply | Qty: 60 | Fill #2

## 2020-10-30 MED FILL — BUTALB-ACETAMIN-CAFF 50-325: 50-325-40 | 30 days supply | Qty: 60 | Fill #0

## 2020-10-30 MED FILL — BACLOFEN 10 MG TABS: 10 | 30 days supply | Qty: 90 | Fill #0

## 2020-11-03 DIAGNOSIS — F69 Unspecified disorder of adult personality and behavior: Secondary | ICD-10-CM | POA: Diagnosis not present

## 2020-11-03 DIAGNOSIS — R410 Disorientation, unspecified: Secondary | ICD-10-CM | POA: Diagnosis not present

## 2020-11-03 DIAGNOSIS — R443 Hallucinations, unspecified: Secondary | ICD-10-CM | POA: Diagnosis not present

## 2020-11-03 DIAGNOSIS — R456 Violent behavior: Secondary | ICD-10-CM | POA: Diagnosis not present

## 2020-11-03 DIAGNOSIS — R404 Transient alteration of awareness: Secondary | ICD-10-CM | POA: Diagnosis not present

## 2020-11-03 DIAGNOSIS — N390 Urinary tract infection, site not specified: Secondary | ICD-10-CM | POA: Diagnosis not present

## 2020-11-03 DIAGNOSIS — B9689 Other specified bacterial agents as the cause of diseases classified elsewhere: Secondary | ICD-10-CM | POA: Diagnosis not present

## 2020-11-03 DIAGNOSIS — R000 Tachycardia, unspecified: Secondary | ICD-10-CM | POA: Diagnosis not present

## 2020-11-03 DIAGNOSIS — F05 Delirium due to known physiological condition: Secondary | ICD-10-CM | POA: Diagnosis not present

## 2020-11-04 DIAGNOSIS — N390 Urinary tract infection, site not specified: Secondary | ICD-10-CM | POA: Diagnosis not present

## 2020-11-04 DIAGNOSIS — R410 Disorientation, unspecified: Secondary | ICD-10-CM | POA: Diagnosis not present

## 2020-11-04 DIAGNOSIS — R443 Hallucinations, unspecified: Secondary | ICD-10-CM | POA: Diagnosis not present

## 2020-11-11 DIAGNOSIS — F419 Anxiety disorder, unspecified: Secondary | ICD-10-CM | POA: Diagnosis not present

## 2020-11-11 DIAGNOSIS — G43009 Migraine without aura, not intractable, without status migrainosus: Secondary | ICD-10-CM | POA: Diagnosis not present

## 2020-11-11 DIAGNOSIS — I10 Essential (primary) hypertension: Secondary | ICD-10-CM | POA: Diagnosis not present

## 2020-11-20 DIAGNOSIS — Z01419 Encounter for gynecological examination (general) (routine) without abnormal findings: Secondary | ICD-10-CM | POA: Diagnosis not present

## 2020-11-20 DIAGNOSIS — Z1231 Encounter for screening mammogram for malignant neoplasm of breast: Secondary | ICD-10-CM | POA: Diagnosis not present

## 2020-11-20 DIAGNOSIS — Z6833 Body mass index (BMI) 33.0-33.9, adult: Secondary | ICD-10-CM | POA: Diagnosis not present

## 2020-11-20 DIAGNOSIS — Z13820 Encounter for screening for osteoporosis: Secondary | ICD-10-CM | POA: Diagnosis not present

## 2020-11-20 DIAGNOSIS — N959 Unspecified menopausal and perimenopausal disorder: Secondary | ICD-10-CM | POA: Diagnosis not present

## 2020-11-27 MED FILL — CLONAZEPAM 1 MG TABS: 1 | 30 days supply | Qty: 60 | Fill #3

## 2020-11-28 ENCOUNTER — Other Ambulatory Visit (HOSPITAL_COMMUNITY): Payer: Self-pay | Admitting: Family Medicine

## 2020-11-28 MED FILL — cloNIDine HCL 0.2 MG TABS: 0.2 | 90 days supply | Qty: 180 | Fill #0

## 2020-11-29 MED FILL — IRBESARTAN 300 MG TABS: 300 | 90 days supply | Qty: 90 | Fill #1

## 2020-12-09 MED FILL — ESOMEPRAZOLE MAG DR 40 MG C: 40 | 90 days supply | Qty: 90 | Fill #1

## 2020-12-10 ENCOUNTER — Other Ambulatory Visit (HOSPITAL_COMMUNITY): Payer: Self-pay | Admitting: Family Medicine

## 2020-12-11 MED FILL — traMADol HCL 50 MG TABS: 50 | 15 days supply | Qty: 30 | Fill #0

## 2020-12-11 MED FILL — TEMAZEPAM 30 MG CAPSULE: 30 | 90 days supply | Qty: 90 | Fill #0

## 2020-12-20 MED FILL — BACLOFEN 10 MG TABS: 10 | 30 days supply | Qty: 90 | Fill #1

## 2020-12-23 ENCOUNTER — Other Ambulatory Visit: Payer: 59

## 2020-12-23 DIAGNOSIS — Z20822 Contact with and (suspected) exposure to covid-19: Secondary | ICD-10-CM

## 2020-12-23 NOTE — Addendum Note (Signed)
Addended by: Rito Ehrlich on: 12/23/2020 03:50 PM   Modules accepted: Orders

## 2020-12-24 ENCOUNTER — Ambulatory Visit: Payer: 59 | Admitting: Psychiatry

## 2020-12-25 DIAGNOSIS — B349 Viral infection, unspecified: Secondary | ICD-10-CM | POA: Diagnosis not present

## 2020-12-25 DIAGNOSIS — J0100 Acute maxillary sinusitis, unspecified: Secondary | ICD-10-CM | POA: Diagnosis not present

## 2020-12-25 LAB — NOVEL CORONAVIRUS, NAA: SARS-CoV-2, NAA: NOT DETECTED

## 2020-12-25 LAB — SARS-COV-2, NAA 2 DAY TAT

## 2020-12-26 ENCOUNTER — Ambulatory Visit: Payer: 59 | Admitting: Neurology

## 2020-12-30 ENCOUNTER — Other Ambulatory Visit (HOSPITAL_COMMUNITY): Payer: Self-pay | Admitting: Family Medicine

## 2020-12-31 MED FILL — CLONAZEPAM 1 MG TABS: 1 | 30 days supply | Qty: 60 | Fill #0

## 2021-01-10 ENCOUNTER — Other Ambulatory Visit (HOSPITAL_COMMUNITY): Payer: Self-pay | Admitting: Family Medicine

## 2021-01-10 DIAGNOSIS — J0101 Acute recurrent maxillary sinusitis: Secondary | ICD-10-CM | POA: Diagnosis not present

## 2021-01-10 DIAGNOSIS — F419 Anxiety disorder, unspecified: Secondary | ICD-10-CM | POA: Diagnosis not present

## 2021-01-10 DIAGNOSIS — I10 Essential (primary) hypertension: Secondary | ICD-10-CM | POA: Diagnosis not present

## 2021-01-10 DIAGNOSIS — R7303 Prediabetes: Secondary | ICD-10-CM | POA: Diagnosis not present

## 2021-01-10 DIAGNOSIS — K219 Gastro-esophageal reflux disease without esophagitis: Secondary | ICD-10-CM | POA: Diagnosis not present

## 2021-01-10 DIAGNOSIS — L219 Seborrheic dermatitis, unspecified: Secondary | ICD-10-CM | POA: Diagnosis not present

## 2021-01-10 DIAGNOSIS — G4700 Insomnia, unspecified: Secondary | ICD-10-CM | POA: Diagnosis not present

## 2021-01-10 DIAGNOSIS — E559 Vitamin D deficiency, unspecified: Secondary | ICD-10-CM | POA: Diagnosis not present

## 2021-01-10 DIAGNOSIS — G43009 Migraine without aura, not intractable, without status migrainosus: Secondary | ICD-10-CM | POA: Diagnosis not present

## 2021-01-10 MED FILL — CLOTRIMAZOLE-BETAMETHASONE: 1-0.05 | 14 days supply | Qty: 15 | Fill #0

## 2021-01-10 MED FILL — PROMETHAZINE W/DM SYRUP: 6.25-15 | 6 days supply | Qty: 120 | Fill #0

## 2021-01-10 MED FILL — levoFLOXacin 500 MG TABS: 500 | 10 days supply | Qty: 10 | Fill #0

## 2021-01-17 MED FILL — BACLOFEN 10 MG TABS: 10 | 30 days supply | Qty: 90 | Fill #2

## 2021-01-20 ENCOUNTER — Ambulatory Visit (INDEPENDENT_AMBULATORY_CARE_PROVIDER_SITE_OTHER): Payer: 59 | Admitting: Psychiatry

## 2021-01-20 ENCOUNTER — Other Ambulatory Visit: Payer: Self-pay

## 2021-01-20 DIAGNOSIS — F411 Generalized anxiety disorder: Secondary | ICD-10-CM

## 2021-01-20 NOTE — Progress Notes (Signed)
[facility] Counselor Initial Adult Exam  Name: Theresa Henry Date: 01/20/2021 MRN: [id number] DOB: 19-Dec-1965 PCP: Shirline Frees, MD  Time spent: 60 minutes    4:00pm to 5:00pm  Guardian/Payee:  patient    Paperwork requested:  No   Reason for Visit /Presenting Problem: anxiety, depression, difficulty with Dad having dementia  Mental Status Exam:   Appearance:   Casual     Behavior:  Appropriate, Sharing and Motivated  Motor:  Normal  Speech/Language:   Clear and Coherent  Affect:  anxious, depressed  Mood:  anxious and depressed  Thought process:  goal directed  Thought content:    some obsessiveness  Sensory/Perceptual disturbances:    WNL  Orientation:  oriented to person, place, time/date, situation, day of week, month of year and year  Attention:  Good  Concentration:  Good  Memory:  WNL  Fund of knowledge:   Good  Insight:    Good  Judgment:   Good  Impulse Control:  Good   Reported Symptoms:  See symptoms above  Risk Assessment: Danger to Self:  No Self-injurious Behavior: No Danger to Others: No Duty to Warn:no Physical Aggression / Violence:No  Access to Firearms a concern: No  Gang Involvement:No  Patient / guardian was educated about steps to take if suicide or homicide risk level increases between visits: Denies any SI. While future psychiatric events cannot be accurately predicted, the patient does not currently require acute inpatient psychiatric care and does not currently meet [facility] involuntary commitment criteria.  Substance Abuse History: Current substance abuse: No     Past Psychiatric History:   Previous psychological history is significant for anxiety and depression Outpatient Providers:Dr. Cottle years ago, Trina Ao, DNP (for meds) History of Psych Hospitalization: 1 Invol Comm. when "I was real stressed" Psychological Testing: n/a     Abuse History: Victim of No., n/a   Report needed: No. Victim of  Neglect:No. Perpetrator of n/a  Witness / Exposure to Domestic Violence: No   Protective Services Involvement: No  Witness to [employer] Violence:  No   Family History: Reviewed with patient and she confirms info below. Family History  Problem Relation Age of Onset  . Ovarian cancer Mother   . High Cholesterol Father   . Heart disease Father   . Migraines Brother   . Migraines Paternal Grandfather   . Migraines Daughter   . Breast cancer Maternal Aunt        in 57's or 75's    Living situation: the patient lives with their spouse who drives a truck locally  Sexual Orientation:  Straight  Relationship Status: married for 27 yrs. Name of spouse / other: n/a             If a parent, number of children / ages: daughter, 24 yr old lives with her boyfriend, is close to daughter; Patient's dad has dementia and they  Are trying to keep him at home.  Support Systems; spouse friends  Museum/gallery curator Stress:  Yes   Income/Employment/Disability: Employment  Armed forces logistics/support/administrative officer: No   Educational History: Education: Scientist, product/process development:   [employer]  Any cultural differences that may affect / interfere with treatment:  not applicable   Recreation/Hobbies: reading  Stressors:Financial difficulties Loss of brother in 2020, mom in 1991  Strengths:  Supportive Relationships, Family, Friends, Spirituality, Hopefulness, Conservator, museum/gallery and Able to Communicate Effectively  Barriers:  "If I bring my dad to my home, it could affect me an my husband's  relationship.  Legal History: Pending legal issue / charges: The patient has no significant history of legal issues. History of legal issue / charges: n/a  Medical History/Surgical History: Reviewed with patient and she confirms info below. Past Medical History:  Diagnosis Date  . Allergy   . Anxiety   . GERD (gastroesophageal reflux disease)   . Hypertension   . Migraine   . Obesity     Past Surgical  History:  Procedure Laterality Date  . ABDOMINAL HYSTERECTOMY     per pt-had vaginal hysterectomy!  Marland Kitchen BREAST REDUCTION SURGERY    . CARPAL TUNNEL RELEASE     right hand done 01/2017/left CTR 12/202018  . CERVICAL DISC SURGERY  02/05/2017   c4-c56  . LAPAROSCOPY    . REDUCTION MAMMAPLASTY Bilateral 2001  . TYMPANOSTOMY TUBE PLACEMENT    . WISDOM TOOTH EXTRACTION      Medications: Reviewed with patient and she confirms info below: Current Outpatient Medications  Medication Sig Dispense Refill  . baclofen (LIORESAL) 10 MG tablet Take 10 mg by mouth as needed.     Marland Kitchen buPROPion (WELLBUTRIN XL) 150 MG 24 hr tablet Take 150 mg by mouth daily.    Marland Kitchen buPROPion (WELLBUTRIN XL) 150 MG 24 hr tablet Take 1 tablet (150 mg total) by mouth daily. 30 tablet 0  . butalbital-acetaminophen-caffeine (FIORICET, ESGIC) 50-325-40 MG per tablet Take 1 tablet by mouth 2 (two) times daily as needed for headache (headache).     . cetirizine (ZYRTEC) 10 MG tablet Take 10 mg by mouth at bedtime.    . clonazePAM (KLONOPIN) 1 MG tablet clonazepam 1 mg tablet    . esomeprazole (NEXIUM) 40 MG capsule Take 40 mg by mouth daily at 6 (six) AM.     . furosemide (LASIX) 20 MG tablet Take 1 tablet (20 mg total) by mouth daily as needed. (Patient not taking: Reported on 04/30/2017) 30 tablet 0  . hydrocortisone ointment 0.5 % Apply 1 application topically 2 (two) times daily.    . irbesartan (AVAPRO) 300 MG tablet Take 300 mg by mouth daily.    Marland Kitchen MINIVELLE 0.075 MG/24HR Place 1 patch onto the skin 2 (two) times a week.  12  . promethazine (PHENERGAN) 25 MG tablet Take 25 mg by mouth every 6 (six) hours as needed for nausea (nausea).     . ranitidine (ZANTAC) 150 MG tablet Take 150 mg by mouth as needed for heartburn.    . temazepam (RESTORIL) 30 MG capsule Take 30 mg by mouth at bedtime.  1  . tiZANidine (ZANAFLEX) 4 MG tablet Take 4 mg by mouth every 8 (eight) hours as needed.    . Vitamin E 100 UNIT/GM CREA Apply 1 g  topically daily.     Current Facility-Administered Medications  Medication Dose Route Frequency Provider Last Rate Last Admin  . 0.9 %  sodium chloride infusion  500 mL Intravenous Once Ladene Artist, MD        Allergies  Allergen Reactions  . Nitrous Oxide Swelling and Other (See Comments)  . Diphenhydramine Other (See Comments)    Makes hyperactive Makes hyperactive  . Sulfa Antibiotics     Causes nausea  . Sulfamethoxazole-Trimethoprim Other (See Comments)    Other reaction(s): GI Upset (intolerance) Other reaction(s): GI Upset (intolerance) Other reaction(s): GI Upset (intolerance)   . Amoxicillin Rash  . Amoxicillin-Pot Clavulanate Rash  . Band-Aid Friction Block [Foot Care Products]     Regular band-aids cause redness and itching  .  Erythromycin Base Other (See Comments)  . Penicillins Rash    Diagnoses:    ICD-10-CM   1. Generalized anxiety disorder  F41.1     Plan of Care:  Patient not signing tx plan on computer screen due to Covid.  Treatment Goals: Treatment goals remain on treatment plan as patient works with strategies to achieve her goals. Progress is measured each session and documented and "progress" section of note.  Long term goal: Reduce overall level, frequency, and intensity of the anxiety so that daily functioning is not impaired  Short term goal: Identify, challenge and replace fearful self talk with positive, realistic, and empowering self talk.  Strategies: Help client develop reality based, positive cognitive messages to replace anxious/fearful thoughts and messages.  Progress: Today is patient's first session and she did well in providing information and explaining her concerns with anxiety and depression. Patient is 55 yr old female who has been married 29 yrs with 1 daughter age 41 who lives with S.O. and no kids. 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 Reports significant anxiety and depression with anxiety  being the stronger symptom. Very worried about 56 yr old father with dementia, which is feeling a lot of her anxiety and depression currently.  Her mother died of CA in 98. Brother died in Mar 26, 2019. Struggles with anxious thoughts and sometimes self talk.  Is in the midst of having to make some decisions about the care of her father and wants to make whatever decisions are the best for him.  Patient had to have him placed in a facility recently and that is not working out due to concerns that she has witnessed in person.  She is hoping to have him at her home and have the family manage his care but there is a lot to work through on this.  Worked collaboratively on her treatment plan in session and reviewed it near session and.  Patient is alert, well-oriented, and shows motivation for therapy.  Goal review with patient and next appointment within 2 to 3 weeks.   Shanon Ace, LCSW

## 2021-01-28 ENCOUNTER — Other Ambulatory Visit (HOSPITAL_COMMUNITY): Payer: Self-pay | Admitting: Family Medicine

## 2021-01-28 MED FILL — CLONAZEPAM 1 MG TABS: 1 | 30 days supply | Qty: 60 | Fill #1

## 2021-01-28 MED FILL — AMLODIPINE BESYLATE 10 MG T: 10 | 90 days supply | Qty: 90 | Fill #0

## 2021-02-03 ENCOUNTER — Other Ambulatory Visit: Payer: Self-pay

## 2021-02-03 ENCOUNTER — Ambulatory Visit (INDEPENDENT_AMBULATORY_CARE_PROVIDER_SITE_OTHER): Payer: 59 | Admitting: Psychiatry

## 2021-02-03 DIAGNOSIS — F411 Generalized anxiety disorder: Secondary | ICD-10-CM

## 2021-02-03 NOTE — Progress Notes (Signed)
[facility] Counselor/Therapist Progress Note  Patient ID: Theresa Henry, MRN: [id number],    Date: 02/03/2021  Time Spent: 60 minutes    4:00pm to 5:00pm  Treatment Type: Individual Therapy  Reported Symptoms: Anxiety  Mental Status Exam:  Appearance:   Casual     Behavior:  Appropriate, Sharing and Motivated  Motor:  Normal  Speech/Language:   Blocked  Affect:  anxiety  Mood:  anxious  Thought process:  normal  Thought content:    WNL  Sensory/Perceptual disturbances:    WNL  Orientation:  oriented to person, place, time/date, situation, day of week, month of year and year  Attention:  Good  Concentration:  Good  Memory:  WNL  Fund of knowledge:   Good  Insight:    Good  Judgment:   Good  Impulse Control:  Good   Risk Assessment: Danger to Self:  No Self-injurious Behavior: No Danger to Others: No Duty to Warn:no Physical Aggression / Violence:No  Access to Firearms a concern: No  Gang Involvement:No   Subjective: Patient today reports anxiety as her primary concern, mostly regarding her situation with elderly father in a nursing facility.   Interventions: Cognitive Behavioral Therapy and Solution-Oriented/Positive Psychology  Diagnosis:   ICD-10-CM   1. Generalized anxiety disorder  F41.1      Plan of Care:  Patient not signing tx plan on computer screen due to Covid.  Treatment Goals: Treatment goals remain on treatment plan as patient works with strategies to achieve her goals. Progress is measured each session and documented and "progress" section of note.  Long term goal: Reduce overall level, frequency, and intensity of the anxiety so that daily functioning is not impaired  Short term goal: Identify, challenge and replace fearful self talk with positive, realistic, and empowering self talk.  Strategies: Help client develop reality based, positive cognitive messages to replace anxious/fearful thoughts and  messages.  Progress: Patient in today reporting anxiety mostly regarding her elderly father currently in long term care facility.  Concerned especially about needing to support her elderly father and find him suitable place to be cared for.  Patient wanting to bring father to their home to stay until she can find a suitable place once he is approved for Medicaid.  Husband has been against this plan.  Patient very concerned and very frustrated with husband stating that she"would do it for either one of their fathers and that her husband would certainly expect it to happen for his father".  Her father" is an 56 year old man who stays to himself, does not have many memory issues, and is pretty self-sufficient other than needing some assistance every now and then". Patient rehearsed in session today what she wanted to be able to say to her husband and how might be the best way to say it.  Patient's frustration with her husband is adding to her stress and escalated anxiety about her father.  Her father is currently in a facility that has proven to be 1 that patient has a lot of concerns about.  Lots of anxious thoughts and difficulty assuming the negative instead of positive.  Worked some with her treatment goal plan to interrupt anxious thoughts and replace with more reality based and empowering thoughts.  Also focused on looking for more positives than negatives daily.  Was more calm upon leaving session today.   Review and progress/challenges noted with patient.  Next appointment within 2 weeks.   Shanon Ace, LCSW

## 2021-02-06 DIAGNOSIS — L820 Inflamed seborrheic keratosis: Secondary | ICD-10-CM | POA: Diagnosis not present

## 2021-02-12 MED FILL — BACLOFEN 10 MG TABS: 10 | 30 days supply | Qty: 90 | Fill #3

## 2021-02-18 ENCOUNTER — Other Ambulatory Visit (HOSPITAL_COMMUNITY): Payer: Self-pay | Admitting: Family Medicine

## 2021-02-18 ENCOUNTER — Ambulatory Visit (INDEPENDENT_AMBULATORY_CARE_PROVIDER_SITE_OTHER): Payer: 59 | Admitting: Psychiatry

## 2021-02-18 ENCOUNTER — Other Ambulatory Visit: Payer: Self-pay

## 2021-02-18 DIAGNOSIS — F411 Generalized anxiety disorder: Secondary | ICD-10-CM

## 2021-02-18 MED FILL — IRBESARTAN 300 MG TABS: 300 | 90 days supply | Qty: 90 | Fill #0

## 2021-02-18 MED FILL — BUTALB-ACETAMIN-CAFF 50-325: 50-325-40 | 30 days supply | Qty: 60 | Fill #1

## 2021-02-18 MED FILL — traMADol HCL 50 MG TABS: 50 | 15 days supply | Qty: 30 | Fill #1

## 2021-02-18 NOTE — Progress Notes (Signed)
[facility] Counselor/Therapist Progress Note  Patient ID: Theresa Henry, MRN: [id number],    Date: 02/18/2021  Time Spent: 60 minutes   4:00pm to 5:00pm  Treatment Type: Individual Therapy  Reported Symptoms: anxiety  Mental Status Exam:  Appearance:   Casual     Behavior:  Appropriate, Sharing and Motivated  Motor:  Normal  Speech/Language:   Clear and Coherent  Affect:  anxious  Mood:  anxious  Thought process:  goal directed  Thought content:    WNL  Sensory/Perceptual disturbances:    WNL  Orientation:  oriented to person, place, time/date, situation, day of week, month of year and year  Attention:  Good  Concentration:  Good  Memory:  WNL  Fund of knowledge:   Good  Insight:    Good  Judgment:   Good  Impulse Control:  Good   Risk Assessment: Danger to Self:  No Self-injurious Behavior: No Danger to Others: No Duty to Warn:no Physical Aggression / Violence:No  Access to Firearms a concern: No  Gang Involvement:No   Subjective: Patient today reporting anxiety as her main symptom, mostly re: dad's health and living situation in long term care facility.  Interventions: Solution-Oriented/Positive Psychology and Ego-Supportive  Diagnosis:   ICD-10-CM   1. Generalized anxiety disorder  F41.1     Plan of Care: Patient not signing tx plan on computer screen due to Covid.  Treatment Goals: Treatment goals remain on treatment plan as patient works with strategies to achieve her goals. Progress is measured each sessionand documented and "progress" section of note.  Long term goal: Reduce overall level, frequency, and intensity of the anxiety so that daily functioning is not impaired  Short term goal: Identify, challenge and replace fearful self talk with positive, realistic, and empowering self talk.  Strategies: Help client develop reality based, positive cognitive messages to replace anxious/fearful thoughts and  messages.   Progress: Patient in today reporting anxiety regarding her dad's health and difficulties in his care at long term facility. Patient really struggling with issues re: her dad and the facility. (Not all details included in this note due to patient privacy needs.) Stressed over helping her dad apply for Medicaid. Seeing dad age and struggle is very difficult for patient, and is concerned about his welfare.  If approved for Medicaid she will be trying to get him into a better facility.  If he does not get approved, the plan is to try and move into her home and husband seems to be willing to help however needed at this point.  Dad continues to be pretty self-sufficient is an 56 year old man who stays to himself does not have much memory issues and prefers to spend a lot of time alone watching TV and needs minimal assistance occasionally around the house.  Worked with patient especially on the guilt she feels about certain issues related to her dad, and she is to concentrate more on letting go of the guilt that she realizes now is not very rationally based and tends to weigh her down.  Still having lots of anxiety but towards the end of session today she seemed to be focused on letting go more of guilt and more focused on the things that she is doing that is positive in terms of helping her father, which could help alleviate some of her anxiety.  Worked with some of her anxious thoughts by using treatment goal plan encouraging patient to interrupt those anxious thoughts and replace them with  more reality based and positive thoughts.  Encouraged patient to be in touch with others who are supportive of her, intentionally look for more positives every day, practice positive self talk, and feel good about the work she is doing in therapy and also all that she is doing to help her dad at this point in his life.   Goal review and progress/challenges noted with patient.  Next appointment within 2 to 3  weeks.   Shanon Ace, LCSW

## 2021-02-25 ENCOUNTER — Ambulatory Visit: Payer: 59 | Admitting: Neurology

## 2021-03-03 MED FILL — cloNIDine HCL 0.2 MG TABS: 0.2 | 90 days supply | Qty: 180 | Fill #0

## 2021-03-04 ENCOUNTER — Ambulatory Visit: Payer: 59 | Admitting: Psychiatry

## 2021-03-05 DIAGNOSIS — R112 Nausea with vomiting, unspecified: Secondary | ICD-10-CM | POA: Diagnosis not present

## 2021-03-07 ENCOUNTER — Other Ambulatory Visit (HOSPITAL_BASED_OUTPATIENT_CLINIC_OR_DEPARTMENT_OTHER): Payer: Self-pay

## 2021-03-14 DIAGNOSIS — H60312 Diffuse otitis externa, left ear: Secondary | ICD-10-CM | POA: Diagnosis not present

## 2021-03-17 ENCOUNTER — Other Ambulatory Visit (HOSPITAL_COMMUNITY): Payer: Self-pay

## 2021-03-17 MED FILL — Temazepam Cap 30 MG: ORAL | 90 days supply | Qty: 90 | Fill #0 | Status: AC

## 2021-03-18 ENCOUNTER — Ambulatory Visit: Payer: 59 | Admitting: Psychiatry

## 2021-03-18 ENCOUNTER — Other Ambulatory Visit (HOSPITAL_COMMUNITY): Payer: Self-pay

## 2021-03-18 MED ORDER — BACLOFEN 10 MG PO TABS
ORAL_TABLET | ORAL | 3 refills | Status: DC
  Filled 2021-03-18: qty 90, 30d supply, fill #0
  Filled 2021-04-22: qty 90, 30d supply, fill #1
  Filled 2021-05-27: qty 90, 30d supply, fill #2
  Filled 2021-06-24: qty 90, 30d supply, fill #3

## 2021-03-20 ENCOUNTER — Ambulatory Visit (INDEPENDENT_AMBULATORY_CARE_PROVIDER_SITE_OTHER): Payer: 59 | Admitting: Psychiatry

## 2021-03-20 ENCOUNTER — Other Ambulatory Visit: Payer: Self-pay

## 2021-03-20 DIAGNOSIS — F411 Generalized anxiety disorder: Secondary | ICD-10-CM | POA: Diagnosis not present

## 2021-03-20 NOTE — Progress Notes (Signed)
[facility] Counselor/Therapist Progress Note  Patient ID: Theresa Henry, MRN: [id number],    Date: 03/20/2021  Time Spent: 60 minutes    4:00pm to 5:00pm  Treatment Type: Individual Therapy  Reported Symptoms: Anxiety, depressed  Mental Status Exam:  Appearance:   Casual     Behavior:  Appropriate, Sharing and Motivated  Motor:  Normal  Speech/Language:   Clear and Coherent  Affect:  anxious  Mood:  anxious and depressed  Thought process:  normal  Thought content:    WNL  Sensory/Perceptual disturbances:    WNL  Orientation:  oriented to person, place, time/date, situation, day of week, month of year and year  Attention:  Good  Concentration:  Good  Memory:  WNL  Fund of knowledge:   Good  Insight:    Good  Judgment:   Good  Impulse Control:  Good and Fair   Risk Assessment: Danger to Self:  No Self-injurious Behavior: No Danger to Others: No Duty to Warn:no Physical Aggression / Violence:No  Access to Firearms a concern: No  Gang Involvement:No   Subjective: Patient today reports anxiety and depression personal and family situations, and husband's new job (3rd shift).  (See Progress Note below.)   Interventions: Cognitive Behavioral Therapy and Solution-Oriented/Positive Psychology  Diagnosis:   ICD-10-CM   1. Generalized anxiety disorder  F41.1      Plan of Care: Patient not signing tx plan on computer screen due to Covid.  Treatment Goals: Treatment goals remain on treatment plan as patient works with strategies to achieve her goals. Progress is measured each sessionand documented and "progress" section of note.  Long term goal: Reduce overall level, frequency, and intensity of the anxiety so that daily functioning is not impaired  Short term goal: Identify, challenge and replace fearful self talk with positive, realistic, and empowering self talk.  Strategies: Help client develop reality based, positive cognitive messages to replace  anxious/fearful thoughts and messages.   Progress: Patient in today reporting anxiety and depression mostly due to personal, family, and husband's new 3rd shift job, and daughter's arm injury.  Concerns for her father who has multiple health concerns and placed in a health care facility, which patient has had concerns about, complicating her symptoms even more. Feeling more depressed at nights sometimes "because being home alone at night is a problem, I'm not afraid of being alone, but I've been alone at nights.  Denies any SI. Needed session to share and process a lot of  anxiety, frustration and depression related to family and her elderly dad.  (Not all details shared in this note due to patient privacy needs.)  Patient very open in her sharing and seemed to feel more grounded and empowered to make some positive changes in her situation.  She also agreed to contact her primary care physician Dr. Samara Snide who has previously prescribed her some antidepressants with benefit.  She recalls specifically that they were helpful and is wanting to get back on them at least for a while as there is a lot things still uncertain with family situations.  Her struggle with guilt has lessened some but still present and is encouraged to focus more on the positives things that she is doing to help her father rather than feel guilty about things she has not done in the past.  Encourage patient to practice more positive and affirming self-care and self talk, take advantage of opportunities to be with friends more, get outside some every  day, refrain from self blame, intentionally look for more positives every day, interrupt anxious thoughts and replace with more reality-based thoughts that do not support anxiety and depression, stay in the present focusing on what she can control versus cannot control, and feel good about the strength that she is showing in the midst of difficult circumstances to make changes that can lead  her in a more positive direction.   Goal review and progress/challenges noted with patient.  Next appointment within 2 to 3 weeks.   Theresa Ace, LCSW

## 2021-03-24 ENCOUNTER — Other Ambulatory Visit (HOSPITAL_COMMUNITY): Payer: Self-pay

## 2021-03-25 ENCOUNTER — Other Ambulatory Visit (HOSPITAL_COMMUNITY): Payer: Self-pay

## 2021-03-25 MED ORDER — ESCITALOPRAM OXALATE 10 MG PO TABS
10.0000 mg | ORAL_TABLET | Freq: Every day | ORAL | 5 refills | Status: DC
  Filled 2021-03-25: qty 30, 30d supply, fill #0
  Filled 2021-04-22: qty 30, 30d supply, fill #1
  Filled 2021-06-02: qty 30, 30d supply, fill #2
  Filled 2021-07-10: qty 30, 30d supply, fill #3
  Filled 2021-09-01: qty 30, 30d supply, fill #4
  Filled 2021-10-09: qty 30, 30d supply, fill #5

## 2021-03-25 MED ORDER — ESOMEPRAZOLE MAGNESIUM 40 MG PO CPDR
DELAYED_RELEASE_CAPSULE | ORAL | 1 refills | Status: DC
  Filled 2021-03-25: qty 90, 90d supply, fill #0
  Filled 2021-06-24: qty 90, 90d supply, fill #1

## 2021-03-25 MED ORDER — BUPROPION HCL ER (XL) 300 MG PO TB24
300.0000 mg | ORAL_TABLET | Freq: Every day | ORAL | 5 refills | Status: DC
  Filled 2021-03-25: qty 30, 30d supply, fill #0
  Filled 2021-04-22: qty 30, 30d supply, fill #1
  Filled 2021-05-23: qty 30, 30d supply, fill #2
  Filled 2021-06-24: qty 30, 30d supply, fill #3
  Filled 2021-07-28: qty 30, 30d supply, fill #4
  Filled 2021-09-01: qty 30, 30d supply, fill #5

## 2021-03-27 ENCOUNTER — Other Ambulatory Visit (HOSPITAL_COMMUNITY): Payer: Self-pay

## 2021-03-31 ENCOUNTER — Other Ambulatory Visit (HOSPITAL_COMMUNITY): Payer: Self-pay

## 2021-03-31 MED FILL — Clonazepam Tab 1 MG: ORAL | 30 days supply | Qty: 60 | Fill #0 | Status: AC

## 2021-04-01 ENCOUNTER — Other Ambulatory Visit: Payer: Self-pay

## 2021-04-01 ENCOUNTER — Ambulatory Visit (INDEPENDENT_AMBULATORY_CARE_PROVIDER_SITE_OTHER): Payer: 59 | Admitting: Psychiatry

## 2021-04-01 DIAGNOSIS — F411 Generalized anxiety disorder: Secondary | ICD-10-CM

## 2021-04-01 NOTE — Progress Notes (Signed)
[facility] Counselor/Therapist Progress Note  Patient ID: Theresa Henry, MRN: [id number],    Date: 04/01/2021  Time Spent: 60 minutes     4:00pm to 5:00pm   Treatment Type: Individual Therapy  Reported Symptoms: Anxiety, depression  Mental Status Exam:  Appearance:   Casual     Behavior:  Appropriate, Sharing and Motivated  Motor:  Normal  Speech/Language:   Clear and Coherent  Affect:  anxiety, depression  Mood:  anxious and depressed  Thought process:  goal directed  Thought content:    some obsessiveness  Sensory/Perceptual disturbances:    WNL  Orientation:  oriented to person, place, time/date, situation, day of week, month of year and year  Attention:  Good  Concentration:  Good  Memory:  WNL  Fund of knowledge:   Good  Insight:    Good  Judgment:   Good  Impulse Control:  Good   Risk Assessment: Danger to Self:  No Self-injurious Behavior: No Danger to Others: No Duty to Warn:no Physical Aggression / Violence:No  Access to Firearms a concern: No  Gang Involvement:No   Subjective: Patient today reports anxiety, depression with the depression having increased some recently, mostly related to concerns for her aging dad. (See Progress Noted below.)   Interventions: Solution-Oriented/Positive Psychology and Ego-Supportive  Diagnosis:   ICD-10-CM   1. Generalized anxiety disorder  F41.1      Plan of Care: Patient not signing tx plan on computer screen due to Covid.  Treatment Goals: Treatment goals remain on treatment plan as patient works with strategies to achieve her goals. Progress is measured each sessionand documented and "progress" section of note.  Long term goal: Reduce overall level, frequency, and intensity of the anxiety so that daily functioning is not impaired  Short term goal: Identify, challenge and replace fearful self talk with positive, realistic, and empowering self talk.  Strategies: Help client develop reality  based, positive cognitive messages to replace anxious/fearful thoughts and messages.   Progress: She is in today reporting anxiety and depression, with depression having increased some. Needed the session today to vent and process her thoughts and emotions re: dad's declining health, her anxiety and depression, and other family concerns. Did well in talking through her concerns.  Has gotten back on her Wellbutrin and Lexpro per her PCP Dr Samara Snide. States she has felt some better recently but acknowledges it's too early yet to know if the medicine is helping her , but has helped in the past so is hopeful that it will again.  Using her long and short-term goals and strategy and treatment plan above, worked with patient on her anxious/depressive thoughts to interrupt them and replace with more reality-based and positive thoughts that do not support anxiety and depression.  Use specific examples of some of the anxious and depressive thoughts that she has been having.  Denies any SI.  She is to continue working with the interruption and replacement of the anxious/depressive thoughts between sessions.  Processed a little more of her guilt issues from past in relation to her father, and is definitely at a better place with that, but not gone completely.  Encouraged patient to also practice more of the positive and affirming self-care and self talk that we have worked on in sessions, spend time with friends and others who care for her, get outside some every day and walk when possible, stay away from self blaming, intentionally look for more positives every day, stay in the  present looking at what she can control versus cannot, and to realize the strength that she is showing as she works consistently with goal-directed behaviors and her efforts to feel less depressed and less anxious as she works to move forward in the midst of difficult circumstances.   Review and progress/challenges noted with  patient.  Next appointment within 2 to 3 weeks.   Theresa Ace, LCSW

## 2021-04-04 ENCOUNTER — Other Ambulatory Visit (HOSPITAL_COMMUNITY): Payer: Self-pay

## 2021-04-04 MED FILL — Butalbital-Acetaminophen-Caffeine Tab 50-325-40 MG: ORAL | 30 days supply | Qty: 60 | Fill #0 | Status: AC

## 2021-04-10 ENCOUNTER — Other Ambulatory Visit (HOSPITAL_COMMUNITY): Payer: Self-pay

## 2021-04-17 ENCOUNTER — Ambulatory Visit: Payer: 59 | Admitting: Psychiatry

## 2021-04-22 ENCOUNTER — Other Ambulatory Visit (HOSPITAL_COMMUNITY): Payer: Self-pay

## 2021-05-01 ENCOUNTER — Other Ambulatory Visit (HOSPITAL_COMMUNITY): Payer: Self-pay

## 2021-05-01 MED ORDER — CLONAZEPAM 1 MG PO TABS
ORAL_TABLET | ORAL | 3 refills | Status: DC
  Filled 2021-05-01: qty 60, 30d supply, fill #0
  Filled 2021-06-02: qty 60, 30d supply, fill #1
  Filled 2021-07-01: qty 60, 30d supply, fill #2
  Filled 2021-07-28: qty 60, 30d supply, fill #3

## 2021-05-05 ENCOUNTER — Other Ambulatory Visit (HOSPITAL_COMMUNITY): Payer: Self-pay

## 2021-05-05 MED FILL — Butalbital-Acetaminophen-Caffeine Tab 50-325-40 MG: ORAL | 30 days supply | Qty: 60 | Fill #1 | Status: AC

## 2021-05-06 ENCOUNTER — Ambulatory Visit: Payer: 59 | Admitting: Psychiatry

## 2021-05-13 ENCOUNTER — Other Ambulatory Visit (HOSPITAL_COMMUNITY): Payer: Self-pay

## 2021-05-13 MED FILL — Amlodipine Besylate Tab 10 MG (Base Equivalent): ORAL | 90 days supply | Qty: 90 | Fill #0 | Status: AC

## 2021-05-23 ENCOUNTER — Other Ambulatory Visit (HOSPITAL_COMMUNITY): Payer: Self-pay

## 2021-05-23 MED FILL — Tramadol HCl Tab 50 MG: ORAL | 15 days supply | Qty: 30 | Fill #0 | Status: AC

## 2021-05-27 ENCOUNTER — Other Ambulatory Visit (HOSPITAL_COMMUNITY): Payer: Self-pay

## 2021-05-27 ENCOUNTER — Ambulatory Visit (INDEPENDENT_AMBULATORY_CARE_PROVIDER_SITE_OTHER): Payer: 59 | Admitting: Psychiatry

## 2021-05-27 ENCOUNTER — Other Ambulatory Visit: Payer: Self-pay

## 2021-05-27 DIAGNOSIS — F411 Generalized anxiety disorder: Secondary | ICD-10-CM | POA: Diagnosis not present

## 2021-05-27 NOTE — Progress Notes (Signed)
[facility] Counselor/Therapist Progress Note  Patient ID: Theresa Henry, MRN: [id number],    Date: 05/27/2021  Time Spent: 60 minutes   Treatment Type: Individual Therapy  Reported Symptoms: anxiety, depression  Mental Status Exam:  Appearance:   Casual and Neat     Behavior:  Appropriate, Sharing, and Motivated  Motor:  Normal  Speech/Language:   Clear and Coherent  Affect:  Depressed and anxious  Mood:  anxious and depressed  Thought process:  goal directed  Thought content:    WNL  Sensory/Perceptual disturbances:    WNL  Orientation:  oriented to person, place, time/date, situation, day of week, month of year, and year  Attention:  Good  Concentration:  Good  Memory:  WNL  Fund of knowledge:   Good  Insight:    Good  Judgment:   Good  Impulse Control:  Good   Risk Assessment: Danger to Self:  No Self-injurious Behavior: No Danger to Others: No Duty to Warn:no Physical Aggression / Violence:No  Access to Firearms a concern: No  Gang Involvement:No   Subjective:  Patient in today reporting frustration, anxiety, and depression with the anxiety and depression being the stronger symptoms. Notices sometimes feeling more anxious in the mornings but feels more general rather than specifically focused. Very disappointed in her husband and daughter and sister-in-law re: a recent incident involving patient's father. Patient hurt and angry and was able to process these feelings, along with anxiety and stress re: family and father's placement in nursing home. Some improvement in anxiety and depression but "still have my times of feeling more depressed and more anxious, can be episodic."  Loves her dad but admits it was more stressful when dad was living with her versus in nursing facility. States she is more at peace with the decision to help dad move into nursing home. Vents some family concerns and also the mixed feelings she has had in needing to place dad in facility.  Some tearfulness as she speaks. Working with care facility to provide all documents they need for dad's Medicaid application "but it's been a long wait." Still taking her Wellbutrin and Lexapro, thinks they might be helping some. Sees prescribing doctor, Dr. Samara Snide, in July 2022.  Continued work with her short and long-term goals and strategy in treatment plan, to interrupt anxious/depressive thinking and replace with more reality based and empowering thoughts that do not lead to increased anxiety nor depression. To cut up on some guilt issues mentioned regarding dad, at next session.   Interventions: Solution-Oriented/Positive Psychology and Insight-Oriented  Diagnosis:   ICD-10-CM   1. Generalized anxiety disorder  F41.1       Plan:  Patient today showed good courage and motivation and working on goal-directed behaviors aimed at alleviating her anxiety and depression and feeling better about herself and some of the decisions she is making within the family.  Encouraged patient in several areas that we have found helpful to her including practice more consistent positive self talk and positive self-care, spend time with friends/family and others who care for her, stay away from self blaming, intentionally look for more positives daily, get outside daily and walk when possible, stay in the present looking at what she can control versus cannot, stay in touch with supportive people, continue on her medications as prescribed by Dr. Kenton Kingfisher, look for the positives within herself, feel good about her care of her family and especially her dad, and feel encouraged by the strength  that she is showing in her continued work with goal-directed behaviors in the midst of challenging and often changing circumstances as she tries to move forward in a more positive direction towards improved emotional health.  Goal review and progress/challenges noted with patient.  Next appointment within 2 to 3  weeks.   Shanon Ace, LCSW

## 2021-06-02 ENCOUNTER — Other Ambulatory Visit (HOSPITAL_COMMUNITY): Payer: Self-pay

## 2021-06-02 MED FILL — Irbesartan Tab 300 MG: ORAL | 90 days supply | Qty: 90 | Fill #0 | Status: AC

## 2021-06-02 MED FILL — Clonidine HCl Tab 0.2 MG: ORAL | 90 days supply | Qty: 180 | Fill #0 | Status: AC

## 2021-06-02 MED FILL — Promethazine HCl Tab 25 MG: ORAL | 30 days supply | Qty: 60 | Fill #0 | Status: AC

## 2021-06-10 ENCOUNTER — Ambulatory Visit (INDEPENDENT_AMBULATORY_CARE_PROVIDER_SITE_OTHER): Payer: 59 | Admitting: Psychiatry

## 2021-06-10 ENCOUNTER — Other Ambulatory Visit: Payer: Self-pay

## 2021-06-10 DIAGNOSIS — F411 Generalized anxiety disorder: Secondary | ICD-10-CM | POA: Diagnosis not present

## 2021-06-10 NOTE — Progress Notes (Signed)
[facility] Counselor/Therapist Progress Note  Patient ID: Theresa Henry, MRN: [id number],    Date: 06/10/2021  Time Spent: 60 minutes   Treatment Type: Individual Therapy  Reported Symptoms: anxiety, depression   Mental Status Exam:  Appearance:   Casual and Neat     Behavior:  Appropriate, Sharing, and Motivated  Motor:  Normal  Speech/Language:   Clear and Coherent  Affect:  anxious  Mood:  anxious and depressed  Thought process:  goal directed  Thought content:    WNL  Sensory/Perceptual disturbances:    WNL  Orientation:  oriented to person, place, time/date, situation, day of week, month of year, year, and stated date of June 10, 2021  Attention:  Good  Concentration:  Good  Memory:  WNL  Fund of knowledge:   Good  Insight:    Good  Judgment:   Good  Impulse Control:  Good   Risk Assessment: Danger to Self:  No Self-injurious Behavior: No Danger to Others: No Duty to Warn:no Physical Aggression / Violence:No  Access to Firearms a concern: No  Gang Involvement:No   Subjective:  Patient in today reporting anxiety, depression,  and stress with transition of dad to nursing home facility. Most of her anxiety and depression is related to getting her dad into a nursing home  and all that has been involved with that process. Dad's memory issues starting to be more evident and she does feel he is in a good place based on his needs. Shares that her anxiety is the stronger symptom and depression has decreased some. Lots of feelings about her brother and situations within the family and other family not visiting  dad, and processed these feelings in session today. Working through some unresolved anger and disappointment re: brother and other family. This seemed helpful for patient to vent and process these feelings as she wants to be able to let go and move forward.  Admits that she's still working through some anger which she realized as we're talking today.  Plan to  follow up on this next session as we ran out of time today.  Patient does show some improvement in anxiety and depression has decreased some.  She admits it does still disturb her about some of the mixed feelings other family members had in reference to the need to place dad in a facility.  She does feel good about her supportive dad, however.  She continues on her Wellbutrin and Lexapro with benefit per her PCP.  Patient to continue working with goal directed strategies including interrupting anxious/depressive thinking and replacing with more realistic and affirming thoughts that do not lead to increased anxiety nor depression.  Plan to also follow-up more o next session on some guilt issues in reference to her dad.    Interventions: Cognitive Behavioral Therapy and Insight-Oriented  Diagnosis:   ICD-10-CM   1. Generalized anxiety disorder  F41.1       Plan:  Patient today showing good motivation and did some good work with her treatment goals and in her talk about her relationship with dad and all that is going on and getting him placed in a facility, as well as family relationship issues.  This was helpful and patient feeling validated and also in alleviating some anxiety for her especially regarding decision she is having to make for her father and within the family.  Encouraged patient to continue several behaviors that have been helpful to her in between sessions including: Refrain  from self blaming, practice more consistent positive self talk, spend time with friends/family and others who care for her, look for more positives within herself, get outside daily and walk when possible, intentionally look for positives instead of negatives, stay in the present focusing on what she can control, remain on medications as prescribed by her PCP Dr. Kenton Kingfisher, stay in touch with supportive people, feel good about her care of the family and her dad, and recognize the strengths she is showing as she works with  goal-directed behaviors in the midst of challenging and often changing circumstances within the family as she tries to move forward in a more positive direction.  Goal review and progress/challenges noted with patient.  Next appointment within 2 to 3 weeks.   Shanon Ace, LCSW

## 2021-06-24 ENCOUNTER — Other Ambulatory Visit (HOSPITAL_COMMUNITY): Payer: Self-pay

## 2021-06-25 ENCOUNTER — Other Ambulatory Visit (HOSPITAL_COMMUNITY): Payer: Self-pay

## 2021-06-25 MED ORDER — TEMAZEPAM 30 MG PO CAPS
ORAL_CAPSULE | ORAL | 1 refills | Status: DC
  Filled 2021-06-25: qty 90, 90d supply, fill #0
  Filled 2021-09-26: qty 90, 90d supply, fill #1

## 2021-06-25 MED ORDER — TRAMADOL HCL 50 MG PO TABS
ORAL_TABLET | ORAL | 0 refills | Status: DC
  Filled 2021-06-25: qty 30, 30d supply, fill #0

## 2021-06-26 ENCOUNTER — Other Ambulatory Visit (HOSPITAL_COMMUNITY): Payer: Self-pay

## 2021-07-01 ENCOUNTER — Other Ambulatory Visit: Payer: Self-pay

## 2021-07-01 ENCOUNTER — Other Ambulatory Visit (HOSPITAL_COMMUNITY): Payer: Self-pay

## 2021-07-01 ENCOUNTER — Ambulatory Visit (INDEPENDENT_AMBULATORY_CARE_PROVIDER_SITE_OTHER): Payer: 59 | Admitting: Psychiatry

## 2021-07-01 DIAGNOSIS — F411 Generalized anxiety disorder: Secondary | ICD-10-CM | POA: Diagnosis not present

## 2021-07-01 NOTE — Progress Notes (Signed)
[facility] Counselor/Therapist Progress Note  Patient ID: Theresa Henry, MRN: [id number],    Date: 07/01/2021  Time Spent: 58  minutes  Treatment Type: Individual Therapy  Reported Symptoms: anxiety, stressed, some depression  Mental Status Exam:  Appearance:   Casual     Behavior:  Appropriate, Sharing, and Motivated  Motor:  Normal  Speech/Language:   Clear and Coherent and Normal Rate  Affect:  anxious  Mood:  anxious  Thought process:  normal  Thought content:    WNL  Sensory/Perceptual disturbances:    WNL  Orientation:  oriented to person, place, time/date, situation, day of week, month of year, year, and stated date of July 01, 2021  Attention:  Good  Concentration:  Good  Memory:  WNL  Fund of knowledge:   Good  Insight:    Good  Judgment:   Good  Impulse Control:  Good   Risk Assessment: Danger to Self:  No Self-injurious Behavior: No Danger to Others: No Duty to Warn:no Physical Aggression / Violence:No  Access to Firearms a concern: No  Gang Involvement:No   Subjective:   Patient in today reporting anxiety, depression, and stress.  Also feeling a sense of relief that elderly dad in nursing facility has been approved for Medicaid coverage. Sleep is good. Discussed her sadness, and some prior guilt issues, and some depression re: seeing her father decline and also how she's missed out on having her mom as she died when patient was 36.  Processed these sad feelings as she has thought about them more as she has been caring for dad recently in getting him placed. Sad also re: dad's memory continuing to decline and she is feeling very anxious about that.  Worked with some CBT strategies in helping patient decrease the intensity of her anxiety and also more quickly identify and replace anxious thoughts and self talk with more positive, realistic and empowering thoughts and self talk, per treatment goal plan.  Does seem to see herself however being able to move  forward eventually in a more positive direction but "may take me a little bit of time".  During this process with her dad, she has had a lot of thoughts and emotions to deal with and she expresses how she feels she is still sorting through what all has happened but also very grateful that he is in a facility and being cared for.  Some unresolved anger still with brother and some other family members for their lack of participation in helping their dad, which would also have been a big help to patient.  Continues to remain on her medication as prescribed per her PCP.     Interventions: Cognitive Behavioral Therapy and Solution-Oriented/Positive Psychology  Diagnosis:   ICD-10-CM   1. Generalized anxiety disorder  F41.1        Plan of Care:  Patient not signing tx plan on computer screen due to Covid. Treatment Goals: Treatment goals remain on treatment plan as patient works with strategies to achieve her goals. Progress is measured each session and documented and "progress" section of note. Long term goal: Reduce overall level, frequency, and intensity of the anxiety so that daily functioning is not impaired Short term goal: Identify, challenge and replace fearful self talk with positive, realistic, and empowering self talk. Strategies: Help client develop reality based, positive cognitive messages to replace anxious/fearful thoughts and messages.    Plan:  Patient today showing real good motivation and did some good work  with her treatment goals and processing feelings related to dad and family, as well as herself and some of the struggles she has had with certain anxious thoughts and sadness.  This seemed helpful and grounding for patient.  Encouraged her to continue using several behaviors that have been helpful to her in between sessions including: Look for more positives within herself, refrain from self blaming, practice consistent positive self talk, spend time with friends/family and  others who care for her, intentionally look for more positives daily, get outside daily and walk when possible, stay in the present focused on what she can control, feel good about her care of the family and her dad, remain on her prescribed medications by her PCP Dr. Kenton Kingfisher, stay in touch with supportive people, and recognize the strengths she is showing as she works with goal-directed behaviors in the midst of challenging circumstances as she tries to move forward in a more positive direction of improved emotional health.   Goal review and progress/challenges noted with patient.  Next appointment within 2 to 3 weeks.   This record has been created using [employer].  Chart creation errors have been sought, but may not always have been located and corrected.  Such creation errors do not reflect on the standard of medical care.   Shanon Ace, LCSW

## 2021-07-10 ENCOUNTER — Other Ambulatory Visit (HOSPITAL_COMMUNITY): Payer: Self-pay

## 2021-07-10 DIAGNOSIS — I10 Essential (primary) hypertension: Secondary | ICD-10-CM | POA: Diagnosis not present

## 2021-07-10 DIAGNOSIS — R7303 Prediabetes: Secondary | ICD-10-CM | POA: Diagnosis not present

## 2021-07-10 DIAGNOSIS — F419 Anxiety disorder, unspecified: Secondary | ICD-10-CM | POA: Diagnosis not present

## 2021-07-10 DIAGNOSIS — M25562 Pain in left knee: Secondary | ICD-10-CM | POA: Diagnosis not present

## 2021-07-10 DIAGNOSIS — G4700 Insomnia, unspecified: Secondary | ICD-10-CM | POA: Diagnosis not present

## 2021-07-10 DIAGNOSIS — G43009 Migraine without aura, not intractable, without status migrainosus: Secondary | ICD-10-CM | POA: Diagnosis not present

## 2021-07-10 DIAGNOSIS — F331 Major depressive disorder, recurrent, moderate: Secondary | ICD-10-CM | POA: Diagnosis not present

## 2021-07-10 MED ORDER — PREDNISONE 10 MG PO TABS
ORAL_TABLET | ORAL | 0 refills | Status: DC
  Filled 2021-07-10: qty 30, 12d supply, fill #0

## 2021-07-11 ENCOUNTER — Other Ambulatory Visit (HOSPITAL_COMMUNITY): Payer: Self-pay

## 2021-07-11 MED ORDER — BUTALBITAL-APAP-CAFFEINE 50-300-40 MG PO CAPS
ORAL_CAPSULE | ORAL | 3 refills | Status: DC
  Filled 2021-07-11: qty 60, 30d supply, fill #0

## 2021-07-12 ENCOUNTER — Other Ambulatory Visit (HOSPITAL_COMMUNITY): Payer: Self-pay

## 2021-07-12 MED ORDER — BUTALBITAL-APAP-CAFFEINE 50-325-40 MG PO TABS
1.0000 | ORAL_TABLET | Freq: Four times a day (QID) | ORAL | 3 refills | Status: DC
  Filled 2021-07-12: qty 60, 30d supply, fill #0
  Filled 2021-08-13: qty 60, 30d supply, fill #1
  Filled 2021-09-12: qty 60, 30d supply, fill #2
  Filled 2021-10-13: qty 60, 30d supply, fill #3

## 2021-07-14 ENCOUNTER — Other Ambulatory Visit (HOSPITAL_COMMUNITY): Payer: Self-pay

## 2021-07-15 ENCOUNTER — Other Ambulatory Visit (HOSPITAL_BASED_OUTPATIENT_CLINIC_OR_DEPARTMENT_OTHER): Payer: Self-pay

## 2021-07-17 ENCOUNTER — Ambulatory Visit: Payer: 59 | Admitting: Psychiatry

## 2021-07-24 ENCOUNTER — Other Ambulatory Visit (HOSPITAL_COMMUNITY): Payer: Self-pay

## 2021-07-25 ENCOUNTER — Other Ambulatory Visit (HOSPITAL_COMMUNITY): Payer: Self-pay

## 2021-07-26 ENCOUNTER — Other Ambulatory Visit (HOSPITAL_COMMUNITY): Payer: Self-pay

## 2021-07-26 MED ORDER — BACLOFEN 10 MG PO TABS
ORAL_TABLET | ORAL | 6 refills | Status: DC
  Filled 2021-07-26: qty 90, 30d supply, fill #0
  Filled 2021-08-28: qty 90, 30d supply, fill #1
  Filled 2021-09-26: qty 90, 30d supply, fill #2
  Filled 2021-10-31: qty 90, 30d supply, fill #3
  Filled 2021-12-01: qty 90, 30d supply, fill #4
  Filled 2021-12-24: qty 90, 30d supply, fill #5

## 2021-07-28 ENCOUNTER — Other Ambulatory Visit (HOSPITAL_COMMUNITY): Payer: Self-pay

## 2021-08-08 ENCOUNTER — Other Ambulatory Visit (HOSPITAL_COMMUNITY): Payer: Self-pay

## 2021-08-08 MED ORDER — AMLODIPINE BESYLATE 10 MG PO TABS
ORAL_TABLET | ORAL | 1 refills | Status: DC
  Filled 2021-08-08: qty 90, 90d supply, fill #0
  Filled 2021-11-12: qty 90, 90d supply, fill #1

## 2021-08-08 MED FILL — Escitalopram Oxalate Tab 10 MG (Base Equiv): ORAL | 30 days supply | Qty: 30 | Fill #0 | Status: AC

## 2021-08-13 ENCOUNTER — Other Ambulatory Visit (HOSPITAL_COMMUNITY): Payer: Self-pay

## 2021-08-13 DIAGNOSIS — M25562 Pain in left knee: Secondary | ICD-10-CM | POA: Diagnosis not present

## 2021-08-13 MED FILL — Promethazine HCl Tab 25 MG: ORAL | 30 days supply | Qty: 60 | Fill #1 | Status: AC

## 2021-08-28 ENCOUNTER — Other Ambulatory Visit (HOSPITAL_COMMUNITY): Payer: Self-pay

## 2021-08-28 ENCOUNTER — Ambulatory Visit (INDEPENDENT_AMBULATORY_CARE_PROVIDER_SITE_OTHER): Payer: 59 | Admitting: Psychiatry

## 2021-08-28 ENCOUNTER — Other Ambulatory Visit: Payer: Self-pay

## 2021-08-28 DIAGNOSIS — F411 Generalized anxiety disorder: Secondary | ICD-10-CM

## 2021-08-28 DIAGNOSIS — M25562 Pain in left knee: Secondary | ICD-10-CM | POA: Diagnosis not present

## 2021-08-28 NOTE — Progress Notes (Signed)
[facility] Counselor/Therapist Progress Note  Patient ID: Theresa Henry, MRN: [id number],    Date: 08/28/2021  Time Spent: 50 minutes   Treatment Type: Individual Therapy  Reported Symptoms: anxiety, some depression related to elderly dad and the economy "and the world"  Mental Status Exam:  Appearance:   Well Groomed     Behavior:  Appropriate, Sharing, and Motivated  Motor:  Normal  Speech/Language:   Clear and Coherent and Normal Rate  Affect:  anxiety  Mood:  anxious and some depression "and worries"  Thought process:  goal directed  Thought content:    WNL  Sensory/Perceptual disturbances:    WNL  Orientation:  oriented to person, place, time/date, situation, day of week, month of year, year, and stated date of Sept 15, 2022  Attention:  Good  Concentration:  Good  Memory:  WNL  Fund of knowledge:   Good  Insight:    Good  Judgment:   Good  Impulse Control:  Good   Risk Assessment: Danger to Self:  No Self-injurious Behavior: No Danger to Others: No Duty to Warn:no Physical Aggression / Violence:No  Access to Firearms a concern: No  Gang Involvement:No   Subjective: Patient in today reporting anxiety and  some depression.  Has injured her knee at home and is to have an MRI tonight after our session today. Daughter is having wrist surgery soon. Dad settling into assisted living center. Patient has been very concerned about him but he is showing some progress more recently. "I think the depression is more prevalent right now due to how disturbing things are in the world and also the whole process I went through in having to place my dad I a facility." Feels she is managing all that some better but still some challenges which she discussed at length today.  Has a few good friends that she stays in touch with and are supportive. Has made some progress with managing anxious thoughts, per treatment goal plan, and is good at following up with homework between sessions.  Feeling  more encouraged with her progress. Will see again in 1 month and knows she can call before that time if needed.  To continue her medication with benefit hrough her primary care physician.   Interventions: Solution-Oriented/Positive Psychology, Ego-Supportive, and Insight-Oriented  Diagnosis:   ICD-10-CM   1. Generalized anxiety disorder  F41.1       Plan of Care:  Patient not signing tx plan on computer screen due to Covid. Treatment Goals: Treatment goals remain on treatment plan as patient works with strategies to achieve her goals. Progress is measured each session and documented and "progress" section of note. Long term goal: Reduce overall level, frequency, and intensity of the anxiety so that daily functioning is not impaired Short term goal: Identify, challenge and replace fearful self talk with positive, realistic, and empowering self talk. Strategies: Help client develop reality based, positive cognitive messages to replace anxious/fearful thoughts and messages.    Plan:  Patient today very motivated and showing additional progress and managing her anxious thoughts, being able to challenge them and replace with more reality-based and empowering thoughts.  Also handling some of her stressful situations with her dad and within the family better.  Seems to be feeling a little more self-assured and trusting her decision making more.  Encouraged patient to practice positive behaviors that can help support her progress including: Looking for more positives within herself, practice consistent positive self talk, refrain from  self blaming, spend time with friends/family and others who care for her, intentionally look for more positives daily, get outside daily and walk, stay in the present focused on what she can control, feel good about her care of the family and her dad and the decisions that she has to make, remain on her prescribed medications by her primary care doctor, stay in  touch with supportive people, and feel good about the strength she is showing as she works with goal-directed behaviors to move forward in a more positive direction and improved emotional health.  Goal review and progress/challenges noted with patient.  Next appointment within 2 to 3 weeks.   Shanon Ace, LCSW

## 2021-08-29 ENCOUNTER — Other Ambulatory Visit (HOSPITAL_COMMUNITY): Payer: Self-pay

## 2021-09-01 ENCOUNTER — Other Ambulatory Visit (HOSPITAL_COMMUNITY): Payer: Self-pay

## 2021-09-01 MED ORDER — CLONAZEPAM 1 MG PO TABS
ORAL_TABLET | ORAL | 3 refills | Status: DC
  Filled 2021-09-01: qty 60, 30d supply, fill #0
  Filled 2021-09-30: qty 60, 30d supply, fill #1
  Filled 2021-10-31: qty 60, 30d supply, fill #2
  Filled 2021-12-03: qty 60, 30d supply, fill #3

## 2021-09-02 ENCOUNTER — Other Ambulatory Visit (HOSPITAL_COMMUNITY): Payer: Self-pay

## 2021-09-02 MED ORDER — CLONIDINE HCL 0.2 MG PO TABS
ORAL_TABLET | ORAL | 1 refills | Status: DC
  Filled 2021-09-02: qty 180, 90d supply, fill #0
  Filled 2021-12-09: qty 180, 90d supply, fill #1

## 2021-09-02 MED ORDER — IRBESARTAN 300 MG PO TABS
ORAL_TABLET | ORAL | 1 refills | Status: DC
  Filled 2021-09-02: qty 90, 90d supply, fill #0
  Filled 2021-12-09: qty 90, 90d supply, fill #1

## 2021-09-12 ENCOUNTER — Other Ambulatory Visit (HOSPITAL_COMMUNITY): Payer: Self-pay

## 2021-09-15 DIAGNOSIS — S83242A Other tear of medial meniscus, current injury, left knee, initial encounter: Secondary | ICD-10-CM | POA: Diagnosis not present

## 2021-09-26 ENCOUNTER — Other Ambulatory Visit (HOSPITAL_COMMUNITY): Payer: Self-pay

## 2021-09-30 ENCOUNTER — Other Ambulatory Visit (HOSPITAL_COMMUNITY): Payer: Self-pay

## 2021-09-30 DIAGNOSIS — H5213 Myopia, bilateral: Secondary | ICD-10-CM | POA: Diagnosis not present

## 2021-09-30 DIAGNOSIS — H524 Presbyopia: Secondary | ICD-10-CM | POA: Diagnosis not present

## 2021-10-01 ENCOUNTER — Other Ambulatory Visit (HOSPITAL_COMMUNITY): Payer: Self-pay

## 2021-10-01 MED ORDER — BUPROPION HCL ER (XL) 300 MG PO TB24
ORAL_TABLET | ORAL | 2 refills | Status: DC
  Filled 2021-10-01: qty 30, 30d supply, fill #0
  Filled 2021-10-31: qty 30, 30d supply, fill #1
  Filled 2021-12-09: qty 30, 30d supply, fill #2

## 2021-10-02 ENCOUNTER — Ambulatory Visit: Payer: 59 | Admitting: Psychiatry

## 2021-10-02 ENCOUNTER — Other Ambulatory Visit (HOSPITAL_COMMUNITY): Payer: Self-pay

## 2021-10-06 ENCOUNTER — Other Ambulatory Visit (HOSPITAL_COMMUNITY): Payer: Self-pay

## 2021-10-07 ENCOUNTER — Other Ambulatory Visit (HOSPITAL_COMMUNITY): Payer: Self-pay

## 2021-10-07 MED ORDER — ESOMEPRAZOLE MAGNESIUM 40 MG PO CPDR
40.0000 mg | DELAYED_RELEASE_CAPSULE | Freq: Every day | ORAL | 0 refills | Status: DC
  Filled 2021-10-07: qty 90, 90d supply, fill #0

## 2021-10-09 ENCOUNTER — Other Ambulatory Visit (HOSPITAL_COMMUNITY): Payer: Self-pay

## 2021-10-09 MED FILL — Promethazine HCl Tab 25 MG: ORAL | 30 days supply | Qty: 60 | Fill #2 | Status: AC

## 2021-10-13 ENCOUNTER — Other Ambulatory Visit (HOSPITAL_COMMUNITY): Payer: Self-pay

## 2021-10-17 ENCOUNTER — Other Ambulatory Visit (HOSPITAL_COMMUNITY): Payer: Self-pay

## 2021-10-17 DIAGNOSIS — H9042 Sensorineural hearing loss, unilateral, left ear, with unrestricted hearing on the contralateral side: Secondary | ICD-10-CM | POA: Diagnosis not present

## 2021-10-17 DIAGNOSIS — I10 Essential (primary) hypertension: Secondary | ICD-10-CM | POA: Diagnosis not present

## 2021-10-17 DIAGNOSIS — G43009 Migraine without aura, not intractable, without status migrainosus: Secondary | ICD-10-CM | POA: Diagnosis not present

## 2021-10-17 MED ORDER — PREDNISONE 10 MG PO TABS
ORAL_TABLET | ORAL | 0 refills | Status: DC
  Filled 2021-10-17: qty 30, 12d supply, fill #0

## 2021-10-23 ENCOUNTER — Other Ambulatory Visit (HOSPITAL_COMMUNITY): Payer: Self-pay

## 2021-10-23 DIAGNOSIS — S83232A Complex tear of medial meniscus, current injury, left knee, initial encounter: Secondary | ICD-10-CM | POA: Diagnosis not present

## 2021-10-23 DIAGNOSIS — G8918 Other acute postprocedural pain: Secondary | ICD-10-CM | POA: Diagnosis not present

## 2021-10-23 DIAGNOSIS — M94262 Chondromalacia, left knee: Secondary | ICD-10-CM | POA: Diagnosis not present

## 2021-10-23 MED ORDER — PROMETHAZINE HCL 25 MG PO TABS
ORAL_TABLET | ORAL | 0 refills | Status: DC
  Filled 2021-10-23: qty 30, 5d supply, fill #0

## 2021-10-23 MED ORDER — CEPHALEXIN 500 MG PO CAPS
ORAL_CAPSULE | ORAL | 0 refills | Status: DC
  Filled 2021-10-23: qty 12, 3d supply, fill #0

## 2021-10-23 MED ORDER — METHOCARBAMOL 500 MG PO TABS
ORAL_TABLET | ORAL | 0 refills | Status: DC
  Filled 2021-10-23: qty 30, 7d supply, fill #0

## 2021-10-23 MED ORDER — OXYCODONE HCL 5 MG PO TABS
ORAL_TABLET | ORAL | 0 refills | Status: DC
Start: 2021-10-23 — End: 2023-07-05
  Filled 2021-10-23: qty 30, 5d supply, fill #0

## 2021-10-28 ENCOUNTER — Other Ambulatory Visit (HOSPITAL_COMMUNITY): Payer: Self-pay

## 2021-10-28 MED ORDER — PREDNISONE 10 MG PO TABS
ORAL_TABLET | ORAL | 0 refills | Status: DC
  Filled 2021-10-28 – 2021-10-31 (×2): qty 30, 12d supply, fill #0

## 2021-10-31 ENCOUNTER — Other Ambulatory Visit (HOSPITAL_COMMUNITY): Payer: Self-pay

## 2021-11-03 ENCOUNTER — Other Ambulatory Visit (HOSPITAL_COMMUNITY): Payer: Self-pay

## 2021-11-11 ENCOUNTER — Other Ambulatory Visit: Payer: Self-pay

## 2021-11-11 ENCOUNTER — Ambulatory Visit (INDEPENDENT_AMBULATORY_CARE_PROVIDER_SITE_OTHER): Payer: 59 | Admitting: Psychiatry

## 2021-11-11 DIAGNOSIS — H9202 Otalgia, left ear: Secondary | ICD-10-CM | POA: Diagnosis not present

## 2021-11-11 DIAGNOSIS — F411 Generalized anxiety disorder: Secondary | ICD-10-CM | POA: Diagnosis not present

## 2021-11-11 DIAGNOSIS — H903 Sensorineural hearing loss, bilateral: Secondary | ICD-10-CM | POA: Diagnosis not present

## 2021-11-11 NOTE — Progress Notes (Signed)
[facility] Counselor/Therapist Progress Note  Patient ID: Theresa Henry, MRN: [id number],    Date: 11/11/2021  Time Spent: 50 minutes   Treatment Type: Individual Therapy  Reported Symptoms: "high anxiety", stressed with her dad's health in long term care facility; denies any depression  Mental Status Exam:  Appearance:   Casual     Behavior:  Appropriate, Sharing, and Motivated  Motor:  Normal  Speech/Language:   Clear and Coherent  Affect:  Anxious, stressed  Mood:  anxious  Thought process:  goal directed  Thought content:    Some overthinking  Sensory/Perceptual disturbances:    WNL  Orientation:  oriented to person, place, time/date, situation, day of week, month of year, year, and stated date of Nov. 29, 2022  Attention:  Good  Concentration:  Good  Memory:  WNL  Fund of knowledge:   Good  Insight:    Good  Judgment:   Good  Impulse Control:  Good   Risk Assessment: Danger to Self:  No Self-injurious Behavior: No Danger to Others: No Duty to Warn:no Physical Aggression / Violence:No  Access to Firearms a concern: No  Gang Involvement:No   Subjective: Patient in today reporting "high anxiety" re: her dad in long term care facility and questions re: her brother that died 2 yrs ago. Patient had knee surgery recently and is recuperating. No longer having any depression, per patient. Patient does feel she had been progressing but a situation regarding her dad exacerbated her anxiety most recently. Patient reports she is better in that she's not depressed, and is focusing on decreasing her anxiety more. Does notice she stays at home more, works from home,  And has not been getting out as much.  We discussed this and she is to make a more conscious effort to get out some each week as she also agreed this is healthy for her.    Interventions: Solution-Oriented/Positive Psychology, Ego-Supportive, and Insight-Oriented  Plan of Care:  Patient not signing tx plan  on computer screen due to Covid. Treatment Goals: Treatment goals remain on treatment plan as patient works with strategies to achieve her goals. Progress is measured each session and documented and "progress" section of note. Long term goal: Reduce overall level, frequency, and intensity of the anxiety so that daily functioning is not impaired Short term goal: Identify, challenge and replace fearful self talk with positive, realistic, and empowering self talk. Strategies: Help client develop reality based, positive cognitive messages to replace anxious/fearful thoughts and messages.    Diagnosis:   ICD-10-CM   1. Generalized anxiety disorder  F41.1       Plan:  Patient today showing good motivation and active participation in session as she worked on her heightened anxiety re: issues with dad who is in long term care facility. Processed thoughts and feelings during session and was less anxious, more calm and grounded by session end. Feels she is making progress and she is not depressed at this point, and is trying to manage her anxiety in healthy ways. Still feels more self-assured and trusts her decision making for her dad.  Encouraged patient and her practice of positive behaviors including: Believing in herself more, looking for more positives within herself, practice consistent positive self talk, refrain from self blaming, spend time with friends/family and others who care about her, intentionally look for more positives each day, get outside daily and walk, stay in the present focused on what she can change, feel good about her  care of her family and dad and the decisions that she has to make, remain on her prescribed medications by her primary care doctor, stay in touch with supportive people, and recognize the strengths she shows working with goal-directed behaviors to move forward in a more positive direction of improved emotional health.  Goal review and progress/challenges noted with  patient.  Next appointment within 3 to 4 weeks.  This record has been created using [employer].  Chart creation errors have been sought, but may not always have been located and corrected.  Such creation errors do not reflect on the standard of medical care provided.    Shanon Ace, LCSW

## 2021-11-12 ENCOUNTER — Other Ambulatory Visit (HOSPITAL_COMMUNITY): Payer: Self-pay

## 2021-11-13 ENCOUNTER — Other Ambulatory Visit (HOSPITAL_COMMUNITY): Payer: Self-pay

## 2021-11-13 MED ORDER — BUTALBITAL-APAP-CAFFEINE 50-325-40 MG PO TABS
ORAL_TABLET | ORAL | 3 refills | Status: DC
  Filled 2021-11-13: qty 60, 15d supply, fill #0
  Filled 2021-12-15: qty 60, 15d supply, fill #1
  Filled 2022-01-12: qty 60, 15d supply, fill #2
  Filled 2022-02-02: qty 60, 15d supply, fill #3

## 2021-11-13 MED ORDER — ESCITALOPRAM OXALATE 10 MG PO TABS
10.0000 mg | ORAL_TABLET | Freq: Every day | ORAL | 2 refills | Status: DC
Start: 2021-11-13 — End: 2022-10-20
  Filled 2021-11-13: qty 30, 30d supply, fill #0
  Filled 2021-12-15: qty 30, 30d supply, fill #1
  Filled 2022-01-12: qty 30, 30d supply, fill #2

## 2021-11-25 ENCOUNTER — Other Ambulatory Visit (HOSPITAL_COMMUNITY): Payer: Self-pay

## 2021-11-26 ENCOUNTER — Other Ambulatory Visit (HOSPITAL_COMMUNITY): Payer: Self-pay

## 2021-12-01 ENCOUNTER — Other Ambulatory Visit (HOSPITAL_COMMUNITY): Payer: Self-pay

## 2021-12-02 ENCOUNTER — Other Ambulatory Visit (HOSPITAL_COMMUNITY): Payer: Self-pay

## 2021-12-02 MED ORDER — ESTRADIOL 0.075 MG/24HR TD PTTW
MEDICATED_PATCH | TRANSDERMAL | 0 refills | Status: DC
  Filled 2021-12-02: qty 24, 84d supply, fill #0

## 2021-12-03 ENCOUNTER — Other Ambulatory Visit (HOSPITAL_COMMUNITY): Payer: Self-pay

## 2021-12-09 ENCOUNTER — Other Ambulatory Visit (HOSPITAL_COMMUNITY): Payer: Self-pay

## 2021-12-10 ENCOUNTER — Other Ambulatory Visit (HOSPITAL_COMMUNITY): Payer: Self-pay

## 2021-12-15 ENCOUNTER — Other Ambulatory Visit (HOSPITAL_COMMUNITY): Payer: Self-pay

## 2021-12-24 ENCOUNTER — Other Ambulatory Visit (HOSPITAL_COMMUNITY): Payer: Self-pay

## 2021-12-24 MED ORDER — ESOMEPRAZOLE MAGNESIUM 40 MG PO CPDR
40.0000 mg | DELAYED_RELEASE_CAPSULE | Freq: Every day | ORAL | 0 refills | Status: DC
  Filled 2021-12-24: qty 90, 90d supply, fill #0

## 2021-12-25 ENCOUNTER — Other Ambulatory Visit (HOSPITAL_COMMUNITY): Payer: Self-pay

## 2021-12-26 ENCOUNTER — Other Ambulatory Visit (HOSPITAL_COMMUNITY): Payer: Self-pay

## 2021-12-26 MED ORDER — TEMAZEPAM 30 MG PO CAPS
ORAL_CAPSULE | ORAL | 1 refills | Status: DC
  Filled 2021-12-26: qty 90, 90d supply, fill #0
  Filled 2022-03-30: qty 90, 90d supply, fill #1

## 2022-01-02 ENCOUNTER — Other Ambulatory Visit (HOSPITAL_COMMUNITY): Payer: Self-pay

## 2022-01-02 MED ORDER — CLONAZEPAM 1 MG PO TABS
1.0000 mg | ORAL_TABLET | Freq: Two times a day (BID) | ORAL | 3 refills | Status: DC | PRN
  Filled 2022-01-02: qty 60, 30d supply, fill #0
  Filled 2022-02-02: qty 60, 30d supply, fill #1
  Filled 2022-03-02: qty 60, 30d supply, fill #2
  Filled 2022-04-01: qty 60, 30d supply, fill #3

## 2022-01-03 ENCOUNTER — Other Ambulatory Visit (HOSPITAL_COMMUNITY): Payer: Self-pay

## 2022-01-06 ENCOUNTER — Ambulatory Visit: Payer: 59 | Admitting: Psychiatry

## 2022-01-12 ENCOUNTER — Other Ambulatory Visit (HOSPITAL_COMMUNITY): Payer: Self-pay

## 2022-01-12 MED ORDER — BUPROPION HCL ER (XL) 300 MG PO TB24
300.0000 mg | ORAL_TABLET | Freq: Every day | ORAL | 1 refills | Status: DC
  Filled 2022-01-12: qty 30, 30d supply, fill #0
  Filled 2022-02-16: qty 30, 30d supply, fill #1

## 2022-01-14 DIAGNOSIS — M1712 Unilateral primary osteoarthritis, left knee: Secondary | ICD-10-CM | POA: Diagnosis not present

## 2022-01-16 DIAGNOSIS — R7303 Prediabetes: Secondary | ICD-10-CM | POA: Diagnosis not present

## 2022-01-16 DIAGNOSIS — I10 Essential (primary) hypertension: Secondary | ICD-10-CM | POA: Diagnosis not present

## 2022-01-16 DIAGNOSIS — G43009 Migraine without aura, not intractable, without status migrainosus: Secondary | ICD-10-CM | POA: Diagnosis not present

## 2022-01-16 DIAGNOSIS — K219 Gastro-esophageal reflux disease without esophagitis: Secondary | ICD-10-CM | POA: Diagnosis not present

## 2022-01-16 DIAGNOSIS — F331 Major depressive disorder, recurrent, moderate: Secondary | ICD-10-CM | POA: Diagnosis not present

## 2022-01-19 DIAGNOSIS — R519 Headache, unspecified: Secondary | ICD-10-CM | POA: Diagnosis not present

## 2022-01-19 DIAGNOSIS — R0981 Nasal congestion: Secondary | ICD-10-CM | POA: Diagnosis not present

## 2022-01-19 DIAGNOSIS — E119 Type 2 diabetes mellitus without complications: Secondary | ICD-10-CM | POA: Diagnosis not present

## 2022-01-19 DIAGNOSIS — H9209 Otalgia, unspecified ear: Secondary | ICD-10-CM | POA: Diagnosis not present

## 2022-01-19 DIAGNOSIS — B349 Viral infection, unspecified: Secondary | ICD-10-CM | POA: Diagnosis not present

## 2022-01-19 DIAGNOSIS — Z03818 Encounter for observation for suspected exposure to other biological agents ruled out: Secondary | ICD-10-CM | POA: Diagnosis not present

## 2022-01-21 DIAGNOSIS — M1712 Unilateral primary osteoarthritis, left knee: Secondary | ICD-10-CM | POA: Diagnosis not present

## 2022-01-22 ENCOUNTER — Other Ambulatory Visit (HOSPITAL_COMMUNITY): Payer: Self-pay

## 2022-01-22 MED ORDER — BACLOFEN 10 MG PO TABS
ORAL_TABLET | ORAL | 3 refills | Status: DC
  Filled 2022-01-22: qty 90, 30d supply, fill #0
  Filled 2022-02-16: qty 90, 30d supply, fill #1
  Filled 2022-03-17: qty 90, 30d supply, fill #2
  Filled 2022-04-14: qty 90, 30d supply, fill #3

## 2022-01-23 ENCOUNTER — Other Ambulatory Visit (HOSPITAL_COMMUNITY): Payer: Self-pay

## 2022-01-28 DIAGNOSIS — M1712 Unilateral primary osteoarthritis, left knee: Secondary | ICD-10-CM | POA: Diagnosis not present

## 2022-02-02 ENCOUNTER — Other Ambulatory Visit (HOSPITAL_COMMUNITY): Payer: Self-pay

## 2022-02-02 MED ORDER — ESCITALOPRAM OXALATE 10 MG PO TABS
10.0000 mg | ORAL_TABLET | Freq: Every day | ORAL | 5 refills | Status: DC
  Filled 2022-02-02: qty 30, 30d supply, fill #0
  Filled 2022-03-02: qty 30, 30d supply, fill #1
  Filled 2022-03-30: qty 30, 30d supply, fill #2
  Filled 2022-05-19: qty 30, 30d supply, fill #3
  Filled 2022-06-18: qty 30, 30d supply, fill #4
  Filled 2022-07-25: qty 30, 30d supply, fill #5

## 2022-02-05 ENCOUNTER — Other Ambulatory Visit (HOSPITAL_COMMUNITY): Payer: Self-pay

## 2022-02-16 ENCOUNTER — Other Ambulatory Visit (HOSPITAL_COMMUNITY): Payer: Self-pay

## 2022-02-16 MED ORDER — ESTRADIOL 0.075 MG/24HR TD PTTW
MEDICATED_PATCH | TRANSDERMAL | 90 refills | Status: DC
  Filled 2022-02-16: qty 24, 84d supply, fill #0
  Filled 2022-10-06: qty 24, 84d supply, fill #1

## 2022-02-17 ENCOUNTER — Other Ambulatory Visit (HOSPITAL_COMMUNITY): Payer: Self-pay

## 2022-02-17 MED ORDER — AMLODIPINE BESYLATE 10 MG PO TABS
10.0000 mg | ORAL_TABLET | Freq: Every day | ORAL | 1 refills | Status: DC
  Filled 2022-02-17: qty 90, 90d supply, fill #0
  Filled 2022-05-19: qty 90, 90d supply, fill #1

## 2022-03-02 ENCOUNTER — Other Ambulatory Visit (HOSPITAL_COMMUNITY): Payer: Self-pay

## 2022-03-03 ENCOUNTER — Other Ambulatory Visit (HOSPITAL_COMMUNITY): Payer: Self-pay

## 2022-03-03 MED ORDER — CLONIDINE HCL 0.2 MG PO TABS
ORAL_TABLET | ORAL | 1 refills | Status: DC
  Filled 2022-03-03: qty 180, 90d supply, fill #0
  Filled 2022-06-18: qty 180, 90d supply, fill #1

## 2022-03-04 ENCOUNTER — Other Ambulatory Visit (HOSPITAL_COMMUNITY): Payer: Self-pay

## 2022-03-05 ENCOUNTER — Other Ambulatory Visit (HOSPITAL_COMMUNITY): Payer: Self-pay

## 2022-03-05 MED ORDER — BUTALBITAL-APAP-CAFFEINE 50-325-40 MG PO TABS
ORAL_TABLET | ORAL | 3 refills | Status: DC
  Filled 2022-03-05: qty 60, 30d supply, fill #0
  Filled 2022-04-06: qty 60, 30d supply, fill #1
  Filled 2022-05-05: qty 60, 30d supply, fill #2
  Filled 2022-06-05: qty 60, 30d supply, fill #3

## 2022-03-06 ENCOUNTER — Other Ambulatory Visit (HOSPITAL_COMMUNITY): Payer: Self-pay

## 2022-03-10 ENCOUNTER — Other Ambulatory Visit (HOSPITAL_COMMUNITY): Payer: Self-pay

## 2022-03-17 ENCOUNTER — Other Ambulatory Visit (HOSPITAL_COMMUNITY): Payer: Self-pay

## 2022-03-18 ENCOUNTER — Other Ambulatory Visit (HOSPITAL_COMMUNITY): Payer: Self-pay

## 2022-03-18 MED ORDER — BUPROPION HCL ER (XL) 300 MG PO TB24
300.0000 mg | ORAL_TABLET | Freq: Every day | ORAL | 4 refills | Status: DC
  Filled 2022-03-18: qty 30, 30d supply, fill #0
  Filled 2022-04-21: qty 30, 30d supply, fill #1
  Filled 2022-05-19: qty 30, 30d supply, fill #2
  Filled 2022-06-18: qty 30, 30d supply, fill #3
  Filled 2022-07-25: qty 30, 30d supply, fill #4

## 2022-03-18 MED ORDER — IRBESARTAN 300 MG PO TABS
300.0000 mg | ORAL_TABLET | Freq: Every day | ORAL | 1 refills | Status: DC
  Filled 2022-03-18: qty 90, 90d supply, fill #0
  Filled 2022-06-18: qty 90, 90d supply, fill #1

## 2022-03-26 DIAGNOSIS — M1712 Unilateral primary osteoarthritis, left knee: Secondary | ICD-10-CM | POA: Diagnosis not present

## 2022-03-30 ENCOUNTER — Other Ambulatory Visit (HOSPITAL_COMMUNITY): Payer: Self-pay

## 2022-04-01 ENCOUNTER — Other Ambulatory Visit (HOSPITAL_COMMUNITY): Payer: Self-pay

## 2022-04-01 MED ORDER — ESOMEPRAZOLE MAGNESIUM 40 MG PO CPDR
40.0000 mg | DELAYED_RELEASE_CAPSULE | Freq: Every day | ORAL | 0 refills | Status: DC
  Filled 2022-04-01: qty 90, 90d supply, fill #0

## 2022-04-06 ENCOUNTER — Other Ambulatory Visit (HOSPITAL_COMMUNITY): Payer: Self-pay

## 2022-04-14 ENCOUNTER — Other Ambulatory Visit (HOSPITAL_COMMUNITY): Payer: Self-pay

## 2022-04-21 ENCOUNTER — Other Ambulatory Visit (HOSPITAL_COMMUNITY): Payer: Self-pay

## 2022-04-21 DIAGNOSIS — Z1231 Encounter for screening mammogram for malignant neoplasm of breast: Secondary | ICD-10-CM | POA: Diagnosis not present

## 2022-04-21 DIAGNOSIS — N952 Postmenopausal atrophic vaginitis: Secondary | ICD-10-CM | POA: Diagnosis not present

## 2022-04-21 DIAGNOSIS — Z01419 Encounter for gynecological examination (general) (routine) without abnormal findings: Secondary | ICD-10-CM | POA: Diagnosis not present

## 2022-04-21 DIAGNOSIS — Z6836 Body mass index (BMI) 36.0-36.9, adult: Secondary | ICD-10-CM | POA: Diagnosis not present

## 2022-04-30 ENCOUNTER — Other Ambulatory Visit (HOSPITAL_COMMUNITY): Payer: Self-pay

## 2022-05-01 ENCOUNTER — Other Ambulatory Visit (HOSPITAL_COMMUNITY): Payer: Self-pay

## 2022-05-01 MED ORDER — CLONAZEPAM 1 MG PO TABS
ORAL_TABLET | ORAL | 3 refills | Status: DC
  Filled 2022-05-01: qty 60, 30d supply, fill #0
  Filled 2022-05-30: qty 60, 30d supply, fill #1
  Filled 2022-07-01: qty 60, 30d supply, fill #2
  Filled 2022-08-01: qty 60, 30d supply, fill #3

## 2022-05-02 ENCOUNTER — Other Ambulatory Visit (HOSPITAL_COMMUNITY): Payer: Self-pay

## 2022-05-05 ENCOUNTER — Other Ambulatory Visit (HOSPITAL_COMMUNITY): Payer: Self-pay

## 2022-05-06 ENCOUNTER — Other Ambulatory Visit (HOSPITAL_COMMUNITY): Payer: Self-pay

## 2022-05-06 MED ORDER — PROMETHAZINE HCL 25 MG PO TABS
ORAL_TABLET | ORAL | 0 refills | Status: DC
  Filled 2022-05-06: qty 30, 8d supply, fill #0

## 2022-05-19 ENCOUNTER — Other Ambulatory Visit (HOSPITAL_COMMUNITY): Payer: Self-pay

## 2022-05-19 MED ORDER — BACLOFEN 10 MG PO TABS
ORAL_TABLET | ORAL | 3 refills | Status: DC
  Filled 2022-05-19: qty 90, 30d supply, fill #0
  Filled 2022-06-18: qty 90, 30d supply, fill #1
  Filled 2022-07-18: qty 90, 30d supply, fill #2
  Filled 2022-08-19: qty 90, 30d supply, fill #3

## 2022-05-20 ENCOUNTER — Other Ambulatory Visit (HOSPITAL_COMMUNITY): Payer: Self-pay

## 2022-05-20 MED ORDER — AMLODIPINE BESYLATE 10 MG PO TABS
ORAL_TABLET | ORAL | 0 refills | Status: DC
  Filled 2022-05-20 – 2022-08-19 (×2): qty 90, 90d supply, fill #0

## 2022-05-30 ENCOUNTER — Other Ambulatory Visit (HOSPITAL_COMMUNITY): Payer: Self-pay

## 2022-06-05 ENCOUNTER — Other Ambulatory Visit (HOSPITAL_COMMUNITY): Payer: Self-pay

## 2022-06-18 ENCOUNTER — Other Ambulatory Visit (HOSPITAL_COMMUNITY): Payer: Self-pay

## 2022-06-20 ENCOUNTER — Other Ambulatory Visit (HOSPITAL_COMMUNITY): Payer: Self-pay

## 2022-07-01 ENCOUNTER — Other Ambulatory Visit (HOSPITAL_COMMUNITY): Payer: Self-pay

## 2022-07-01 MED ORDER — ESOMEPRAZOLE MAGNESIUM 40 MG PO CPDR
40.0000 mg | DELAYED_RELEASE_CAPSULE | Freq: Every day | ORAL | 0 refills | Status: DC
  Filled 2022-07-01: qty 90, 90d supply, fill #0

## 2022-07-03 ENCOUNTER — Other Ambulatory Visit (HOSPITAL_COMMUNITY): Payer: Self-pay

## 2022-07-03 MED ORDER — TEMAZEPAM 30 MG PO CAPS
ORAL_CAPSULE | ORAL | 1 refills | Status: DC
Start: 2022-07-02 — End: 2023-06-23
  Filled 2022-07-03: qty 90, 90d supply, fill #0
  Filled 2022-10-06: qty 90, 90d supply, fill #1

## 2022-07-06 ENCOUNTER — Other Ambulatory Visit (HOSPITAL_COMMUNITY): Payer: Self-pay

## 2022-07-07 ENCOUNTER — Other Ambulatory Visit (HOSPITAL_COMMUNITY): Payer: Self-pay

## 2022-07-07 MED ORDER — BUTALBITAL-APAP-CAFFEINE 50-325-40 MG PO TABS
ORAL_TABLET | ORAL | 0 refills | Status: DC
  Filled 2022-07-07: qty 60, 30d supply, fill #0

## 2022-07-18 ENCOUNTER — Other Ambulatory Visit (HOSPITAL_COMMUNITY): Payer: Self-pay

## 2022-07-25 ENCOUNTER — Other Ambulatory Visit (HOSPITAL_COMMUNITY): Payer: Self-pay

## 2022-07-28 ENCOUNTER — Other Ambulatory Visit (HOSPITAL_COMMUNITY): Payer: Self-pay

## 2022-07-28 DIAGNOSIS — G43009 Migraine without aura, not intractable, without status migrainosus: Secondary | ICD-10-CM | POA: Diagnosis not present

## 2022-07-28 DIAGNOSIS — L821 Other seborrheic keratosis: Secondary | ICD-10-CM | POA: Diagnosis not present

## 2022-07-28 DIAGNOSIS — G4700 Insomnia, unspecified: Secondary | ICD-10-CM | POA: Diagnosis not present

## 2022-07-28 DIAGNOSIS — F331 Major depressive disorder, recurrent, moderate: Secondary | ICD-10-CM | POA: Diagnosis not present

## 2022-07-28 DIAGNOSIS — K219 Gastro-esophageal reflux disease without esophagitis: Secondary | ICD-10-CM | POA: Diagnosis not present

## 2022-07-28 DIAGNOSIS — E119 Type 2 diabetes mellitus without complications: Secondary | ICD-10-CM | POA: Diagnosis not present

## 2022-07-28 DIAGNOSIS — I10 Essential (primary) hypertension: Secondary | ICD-10-CM | POA: Diagnosis not present

## 2022-07-28 DIAGNOSIS — F419 Anxiety disorder, unspecified: Secondary | ICD-10-CM | POA: Diagnosis not present

## 2022-07-28 MED ORDER — ESCITALOPRAM OXALATE 20 MG PO TABS
20.0000 mg | ORAL_TABLET | Freq: Every day | ORAL | 5 refills | Status: DC
  Filled 2022-07-28 – 2022-08-11 (×2): qty 30, 30d supply, fill #0
  Filled 2022-09-18: qty 30, 30d supply, fill #1
  Filled 2022-10-20: qty 30, 30d supply, fill #2
  Filled 2023-05-12: qty 30, 30d supply, fill #3

## 2022-08-01 ENCOUNTER — Other Ambulatory Visit (HOSPITAL_COMMUNITY): Payer: Self-pay

## 2022-08-10 ENCOUNTER — Other Ambulatory Visit (HOSPITAL_COMMUNITY): Payer: Self-pay

## 2022-08-11 ENCOUNTER — Other Ambulatory Visit (HOSPITAL_COMMUNITY): Payer: Self-pay

## 2022-08-11 MED ORDER — BUTALBITAL-APAP-CAFFEINE 50-325-40 MG PO TABS
ORAL_TABLET | ORAL | 0 refills | Status: DC
  Filled 2022-08-11: qty 60, 15d supply, fill #0

## 2022-08-12 ENCOUNTER — Other Ambulatory Visit (HOSPITAL_COMMUNITY): Payer: Self-pay

## 2022-08-19 ENCOUNTER — Other Ambulatory Visit (HOSPITAL_COMMUNITY): Payer: Self-pay

## 2022-08-19 MED ORDER — PROMETHAZINE HCL 25 MG PO TABS
ORAL_TABLET | ORAL | 0 refills | Status: DC
Start: 2022-08-19 — End: 2022-09-18
  Filled 2022-08-19: qty 30, 8d supply, fill #0

## 2022-08-20 ENCOUNTER — Other Ambulatory Visit (HOSPITAL_COMMUNITY): Payer: Self-pay

## 2022-09-02 ENCOUNTER — Other Ambulatory Visit (HOSPITAL_COMMUNITY): Payer: Self-pay

## 2022-09-03 ENCOUNTER — Other Ambulatory Visit (HOSPITAL_COMMUNITY): Payer: Self-pay

## 2022-09-04 ENCOUNTER — Other Ambulatory Visit (HOSPITAL_COMMUNITY): Payer: Self-pay

## 2022-09-04 MED ORDER — CLONAZEPAM 1 MG PO TABS
1.0000 mg | ORAL_TABLET | Freq: Two times a day (BID) | ORAL | 3 refills | Status: DC | PRN
  Filled 2022-09-04: qty 60, 30d supply, fill #0
  Filled 2022-10-06: qty 60, 30d supply, fill #1
  Filled 2022-11-04: qty 60, 30d supply, fill #2
  Filled 2022-12-04 (×2): qty 60, 30d supply, fill #3

## 2022-09-04 MED ORDER — BUPROPION HCL ER (XL) 300 MG PO TB24
300.0000 mg | ORAL_TABLET | Freq: Every day | ORAL | 5 refills | Status: DC
  Filled 2022-09-04: qty 30, 30d supply, fill #0
  Filled 2022-10-06: qty 30, 30d supply, fill #1
  Filled 2022-11-06: qty 30, 30d supply, fill #2
  Filled 2022-12-04 (×2): qty 30, 30d supply, fill #3
  Filled 2022-12-31: qty 30, 30d supply, fill #4
  Filled 2023-02-26: qty 30, 30d supply, fill #5

## 2022-09-05 ENCOUNTER — Other Ambulatory Visit (HOSPITAL_COMMUNITY): Payer: Self-pay

## 2022-09-08 ENCOUNTER — Other Ambulatory Visit (HOSPITAL_COMMUNITY): Payer: Self-pay

## 2022-09-09 ENCOUNTER — Other Ambulatory Visit (HOSPITAL_COMMUNITY): Payer: Self-pay

## 2022-09-09 MED ORDER — BUTALBITAL-APAP-CAFFEINE 50-325-40 MG PO TABS
ORAL_TABLET | ORAL | 3 refills | Status: DC
  Filled 2022-09-09: qty 60, 15d supply, fill #0
  Filled 2022-10-06: qty 60, 15d supply, fill #1
  Filled 2022-11-04: qty 60, 15d supply, fill #2
  Filled 2022-12-04 (×2): qty 60, 15d supply, fill #3

## 2022-09-10 ENCOUNTER — Other Ambulatory Visit (HOSPITAL_COMMUNITY): Payer: Self-pay

## 2022-09-17 DIAGNOSIS — B349 Viral infection, unspecified: Secondary | ICD-10-CM | POA: Diagnosis not present

## 2022-09-18 ENCOUNTER — Other Ambulatory Visit (HOSPITAL_COMMUNITY): Payer: Self-pay

## 2022-09-18 MED ORDER — PROMETHAZINE HCL 25 MG PO TABS
25.0000 mg | ORAL_TABLET | ORAL | 0 refills | Status: DC
  Filled 2022-09-18: qty 30, 5d supply, fill #0

## 2022-09-19 ENCOUNTER — Other Ambulatory Visit (HOSPITAL_COMMUNITY): Payer: Self-pay

## 2022-09-21 ENCOUNTER — Other Ambulatory Visit (HOSPITAL_COMMUNITY): Payer: Self-pay

## 2022-09-21 MED ORDER — ESOMEPRAZOLE MAGNESIUM 40 MG PO CPDR
40.0000 mg | DELAYED_RELEASE_CAPSULE | Freq: Every day | ORAL | 0 refills | Status: DC
  Filled 2022-09-21: qty 57, 57d supply, fill #0
  Filled 2022-09-23: qty 33, 33d supply, fill #0

## 2022-09-21 MED ORDER — BACLOFEN 10 MG PO TABS
10.0000 mg | ORAL_TABLET | Freq: Three times a day (TID) | ORAL | 0 refills | Status: DC | PRN
  Filled 2022-09-21: qty 90, 30d supply, fill #0

## 2022-09-21 MED ORDER — CLONIDINE HCL 0.2 MG PO TABS
0.2000 mg | ORAL_TABLET | Freq: Two times a day (BID) | ORAL | 0 refills | Status: DC
  Filled 2022-09-21: qty 180, 90d supply, fill #0

## 2022-09-21 MED ORDER — IRBESARTAN 300 MG PO TABS
300.0000 mg | ORAL_TABLET | Freq: Every day | ORAL | 0 refills | Status: DC
  Filled 2022-09-21: qty 90, 90d supply, fill #0

## 2022-09-22 ENCOUNTER — Other Ambulatory Visit (HOSPITAL_COMMUNITY): Payer: Self-pay

## 2022-09-23 ENCOUNTER — Other Ambulatory Visit (HOSPITAL_COMMUNITY): Payer: Self-pay

## 2022-09-24 ENCOUNTER — Other Ambulatory Visit (HOSPITAL_COMMUNITY): Payer: Self-pay

## 2022-10-06 ENCOUNTER — Other Ambulatory Visit (HOSPITAL_COMMUNITY): Payer: Self-pay

## 2022-10-07 ENCOUNTER — Other Ambulatory Visit (HOSPITAL_COMMUNITY): Payer: Self-pay

## 2022-10-07 MED ORDER — PROMETHAZINE HCL 25 MG PO TABS
25.0000 mg | ORAL_TABLET | ORAL | 0 refills | Status: DC
  Filled 2022-10-07: qty 30, 5d supply, fill #0

## 2022-10-20 ENCOUNTER — Other Ambulatory Visit (HOSPITAL_COMMUNITY): Payer: Self-pay

## 2022-10-21 ENCOUNTER — Other Ambulatory Visit (HOSPITAL_COMMUNITY): Payer: Self-pay

## 2022-10-21 MED ORDER — BACLOFEN 10 MG PO TABS
10.0000 mg | ORAL_TABLET | Freq: Three times a day (TID) | ORAL | 0 refills | Status: DC | PRN
  Filled 2022-10-21: qty 90, 30d supply, fill #0

## 2022-10-21 MED ORDER — ESCITALOPRAM OXALATE 20 MG PO TABS
20.0000 mg | ORAL_TABLET | Freq: Every day | ORAL | 5 refills | Status: DC
  Filled 2022-10-21 – 2022-11-24 (×2): qty 30, 30d supply, fill #0
  Filled 2022-12-24: qty 30, 30d supply, fill #1
  Filled 2023-01-29: qty 30, 30d supply, fill #2
  Filled 2023-02-26: qty 30, 30d supply, fill #3
  Filled 2023-04-12: qty 30, 30d supply, fill #4
  Filled 2023-06-14: qty 30, 30d supply, fill #5

## 2022-10-28 DIAGNOSIS — H524 Presbyopia: Secondary | ICD-10-CM | POA: Diagnosis not present

## 2022-10-28 DIAGNOSIS — H5213 Myopia, bilateral: Secondary | ICD-10-CM | POA: Diagnosis not present

## 2022-11-04 ENCOUNTER — Other Ambulatory Visit (HOSPITAL_COMMUNITY): Payer: Self-pay

## 2022-11-06 ENCOUNTER — Other Ambulatory Visit (HOSPITAL_COMMUNITY): Payer: Self-pay

## 2022-11-18 ENCOUNTER — Other Ambulatory Visit (HOSPITAL_COMMUNITY): Payer: Self-pay

## 2022-11-19 ENCOUNTER — Other Ambulatory Visit (HOSPITAL_COMMUNITY): Payer: Self-pay

## 2022-11-19 MED ORDER — BACLOFEN 10 MG PO TABS
10.0000 mg | ORAL_TABLET | Freq: Three times a day (TID) | ORAL | 0 refills | Status: DC | PRN
  Filled 2022-11-19: qty 90, 30d supply, fill #0

## 2022-11-20 ENCOUNTER — Other Ambulatory Visit (HOSPITAL_COMMUNITY): Payer: Self-pay

## 2022-11-24 ENCOUNTER — Other Ambulatory Visit (HOSPITAL_COMMUNITY): Payer: Self-pay

## 2022-11-25 ENCOUNTER — Other Ambulatory Visit (HOSPITAL_COMMUNITY): Payer: Self-pay

## 2022-11-25 MED ORDER — PROMETHAZINE HCL 25 MG PO TABS
25.0000 mg | ORAL_TABLET | ORAL | 0 refills | Status: DC
  Filled 2022-11-25: qty 30, 5d supply, fill #0

## 2022-11-26 ENCOUNTER — Other Ambulatory Visit (HOSPITAL_COMMUNITY): Payer: Self-pay

## 2022-11-28 ENCOUNTER — Other Ambulatory Visit (HOSPITAL_COMMUNITY): Payer: Self-pay

## 2022-11-28 MED ORDER — AMLODIPINE BESYLATE 10 MG PO TABS
10.0000 mg | ORAL_TABLET | Freq: Every day | ORAL | 0 refills | Status: DC
  Filled 2022-11-28: qty 90, 90d supply, fill #0

## 2022-12-04 ENCOUNTER — Other Ambulatory Visit (HOSPITAL_COMMUNITY): Payer: Self-pay

## 2022-12-04 ENCOUNTER — Other Ambulatory Visit: Payer: Self-pay

## 2022-12-18 ENCOUNTER — Other Ambulatory Visit (HOSPITAL_COMMUNITY): Payer: Self-pay

## 2022-12-19 ENCOUNTER — Other Ambulatory Visit (HOSPITAL_COMMUNITY): Payer: Self-pay

## 2022-12-19 MED ORDER — BACLOFEN 10 MG PO TABS
10.0000 mg | ORAL_TABLET | Freq: Three times a day (TID) | ORAL | 0 refills | Status: DC | PRN
  Filled 2022-12-19: qty 90, 30d supply, fill #0

## 2022-12-24 ENCOUNTER — Other Ambulatory Visit (HOSPITAL_COMMUNITY): Payer: Self-pay

## 2022-12-25 ENCOUNTER — Other Ambulatory Visit (HOSPITAL_COMMUNITY): Payer: Self-pay

## 2022-12-25 MED ORDER — PROMETHAZINE HCL 25 MG PO TABS
ORAL_TABLET | ORAL | 0 refills | Status: DC
  Filled 2022-12-25: qty 30, 5d supply, fill #0

## 2022-12-31 ENCOUNTER — Other Ambulatory Visit (HOSPITAL_COMMUNITY): Payer: Self-pay

## 2023-01-02 ENCOUNTER — Other Ambulatory Visit (HOSPITAL_COMMUNITY): Payer: Self-pay

## 2023-01-04 ENCOUNTER — Other Ambulatory Visit (HOSPITAL_COMMUNITY): Payer: Self-pay

## 2023-01-04 MED ORDER — ESOMEPRAZOLE MAGNESIUM 40 MG PO CPDR
40.0000 mg | DELAYED_RELEASE_CAPSULE | Freq: Every day | ORAL | 0 refills | Status: DC
  Filled 2023-01-04: qty 90, 90d supply, fill #0

## 2023-01-04 MED ORDER — CLONIDINE HCL 0.2 MG PO TABS
ORAL_TABLET | ORAL | 0 refills | Status: DC
  Filled 2023-01-04: qty 180, 90d supply, fill #0

## 2023-01-04 MED ORDER — IRBESARTAN 300 MG PO TABS
300.0000 mg | ORAL_TABLET | Freq: Every day | ORAL | 0 refills | Status: DC
  Filled 2023-01-04: qty 90, 90d supply, fill #0

## 2023-01-06 ENCOUNTER — Other Ambulatory Visit (HOSPITAL_COMMUNITY): Payer: Self-pay

## 2023-01-06 MED ORDER — CLONAZEPAM 1 MG PO TABS
1.0000 mg | ORAL_TABLET | Freq: Two times a day (BID) | ORAL | 3 refills | Status: DC | PRN
  Filled 2023-01-06: qty 60, 30d supply, fill #0
  Filled 2023-02-09: qty 60, 30d supply, fill #1
  Filled 2023-03-10: qty 60, 30d supply, fill #2
  Filled 2023-04-12: qty 60, 30d supply, fill #3

## 2023-01-06 MED ORDER — BUTALBITAL-APAP-CAFFEINE 50-325-40 MG PO TABS
1.0000 | ORAL_TABLET | Freq: Four times a day (QID) | ORAL | 3 refills | Status: DC | PRN
  Filled 2023-01-06: qty 60, 15d supply, fill #0
  Filled 2023-02-09: qty 60, 15d supply, fill #1
  Filled 2023-03-10: qty 60, 15d supply, fill #2
  Filled 2023-04-12: qty 60, 15d supply, fill #3

## 2023-01-06 MED ORDER — TEMAZEPAM 30 MG PO CAPS
30.0000 mg | ORAL_CAPSULE | Freq: Every day | ORAL | 1 refills | Status: DC
  Filled 2023-01-06: qty 90, 90d supply, fill #0
  Filled 2023-04-21: qty 90, 90d supply, fill #1

## 2023-01-28 ENCOUNTER — Other Ambulatory Visit (HOSPITAL_COMMUNITY): Payer: Self-pay

## 2023-01-28 DIAGNOSIS — K219 Gastro-esophageal reflux disease without esophagitis: Secondary | ICD-10-CM | POA: Diagnosis not present

## 2023-01-28 DIAGNOSIS — F331 Major depressive disorder, recurrent, moderate: Secondary | ICD-10-CM | POA: Diagnosis not present

## 2023-01-28 DIAGNOSIS — E119 Type 2 diabetes mellitus without complications: Secondary | ICD-10-CM | POA: Diagnosis not present

## 2023-01-28 DIAGNOSIS — D538 Other specified nutritional anemias: Secondary | ICD-10-CM | POA: Diagnosis not present

## 2023-01-28 DIAGNOSIS — Z1211 Encounter for screening for malignant neoplasm of colon: Secondary | ICD-10-CM | POA: Diagnosis not present

## 2023-01-28 DIAGNOSIS — G4700 Insomnia, unspecified: Secondary | ICD-10-CM | POA: Diagnosis not present

## 2023-01-28 DIAGNOSIS — F419 Anxiety disorder, unspecified: Secondary | ICD-10-CM | POA: Diagnosis not present

## 2023-01-28 DIAGNOSIS — I10 Essential (primary) hypertension: Secondary | ICD-10-CM | POA: Diagnosis not present

## 2023-01-28 DIAGNOSIS — G43009 Migraine without aura, not intractable, without status migrainosus: Secondary | ICD-10-CM | POA: Diagnosis not present

## 2023-01-28 MED ORDER — PROMETHAZINE HCL 25 MG PO TABS
ORAL_TABLET | ORAL | 3 refills | Status: DC
  Filled 2023-01-28: qty 60, 30d supply, fill #0
  Filled 2023-03-30 (×2): qty 60, 30d supply, fill #1
  Filled 2023-04-30: qty 60, 30d supply, fill #2
  Filled 2023-05-31: qty 60, 30d supply, fill #3

## 2023-01-29 ENCOUNTER — Other Ambulatory Visit (HOSPITAL_COMMUNITY): Payer: Self-pay

## 2023-02-01 ENCOUNTER — Other Ambulatory Visit (HOSPITAL_COMMUNITY): Payer: Self-pay

## 2023-02-01 MED ORDER — BACLOFEN 10 MG PO TABS
10.0000 mg | ORAL_TABLET | Freq: Three times a day (TID) | ORAL | 0 refills | Status: DC
  Filled 2023-02-01: qty 90, 30d supply, fill #0

## 2023-02-02 ENCOUNTER — Other Ambulatory Visit (HOSPITAL_COMMUNITY): Payer: Self-pay

## 2023-02-02 MED ORDER — METFORMIN HCL 500 MG PO TABS
500.0000 mg | ORAL_TABLET | Freq: Every day | ORAL | 5 refills | Status: DC
  Filled 2023-02-02: qty 30, 30d supply, fill #0

## 2023-02-09 ENCOUNTER — Other Ambulatory Visit (HOSPITAL_COMMUNITY): Payer: Self-pay

## 2023-02-15 ENCOUNTER — Other Ambulatory Visit (HOSPITAL_COMMUNITY): Payer: Self-pay

## 2023-02-16 ENCOUNTER — Other Ambulatory Visit (HOSPITAL_COMMUNITY): Payer: Self-pay

## 2023-02-16 MED ORDER — METFORMIN HCL ER 500 MG PO TB24
500.0000 mg | ORAL_TABLET | Freq: Every evening | ORAL | 5 refills | Status: AC
  Filled 2023-02-16: qty 30, 30d supply, fill #0
  Filled 2023-03-30 (×2): qty 30, 30d supply, fill #1
  Filled 2023-04-30: qty 30, 30d supply, fill #2
  Filled 2023-05-31: qty 30, 30d supply, fill #3
  Filled 2023-07-14: qty 30, 30d supply, fill #4

## 2023-02-18 ENCOUNTER — Other Ambulatory Visit (HOSPITAL_COMMUNITY): Payer: Self-pay

## 2023-02-26 ENCOUNTER — Other Ambulatory Visit (HOSPITAL_COMMUNITY): Payer: Self-pay

## 2023-02-27 ENCOUNTER — Other Ambulatory Visit (HOSPITAL_COMMUNITY): Payer: Self-pay

## 2023-02-27 MED ORDER — AMLODIPINE BESYLATE 10 MG PO TABS
10.0000 mg | ORAL_TABLET | Freq: Every day | ORAL | 0 refills | Status: DC
  Filled 2023-02-27: qty 90, 90d supply, fill #0

## 2023-02-27 MED ORDER — BACLOFEN 10 MG PO TABS
10.0000 mg | ORAL_TABLET | Freq: Three times a day (TID) | ORAL | 0 refills | Status: DC
  Filled 2023-02-27: qty 90, 30d supply, fill #0

## 2023-03-01 ENCOUNTER — Other Ambulatory Visit (HOSPITAL_COMMUNITY): Payer: Self-pay

## 2023-03-03 ENCOUNTER — Other Ambulatory Visit (HOSPITAL_COMMUNITY): Payer: Self-pay

## 2023-03-03 MED ORDER — CLOTRIMAZOLE-BETAMETHASONE 1-0.05 % EX CREA
TOPICAL_CREAM | CUTANEOUS | 1 refills | Status: AC
  Filled 2023-03-03: qty 15, 14d supply, fill #0
  Filled 2023-03-12: qty 15, 15d supply, fill #0
  Filled 2023-03-19 – 2023-03-30 (×2): qty 15, 7d supply, fill #0

## 2023-03-04 ENCOUNTER — Other Ambulatory Visit (HOSPITAL_COMMUNITY): Payer: Self-pay

## 2023-03-10 ENCOUNTER — Other Ambulatory Visit (HOSPITAL_COMMUNITY): Payer: Self-pay

## 2023-03-11 ENCOUNTER — Other Ambulatory Visit (HOSPITAL_COMMUNITY): Payer: Self-pay

## 2023-03-12 ENCOUNTER — Other Ambulatory Visit (HOSPITAL_COMMUNITY): Payer: Self-pay

## 2023-03-19 ENCOUNTER — Other Ambulatory Visit (HOSPITAL_COMMUNITY): Payer: Self-pay

## 2023-03-26 ENCOUNTER — Other Ambulatory Visit (HOSPITAL_COMMUNITY): Payer: Self-pay

## 2023-03-30 ENCOUNTER — Other Ambulatory Visit (HOSPITAL_COMMUNITY): Payer: Self-pay

## 2023-03-31 ENCOUNTER — Other Ambulatory Visit: Payer: Self-pay

## 2023-03-31 ENCOUNTER — Other Ambulatory Visit (HOSPITAL_COMMUNITY): Payer: Self-pay

## 2023-03-31 MED ORDER — BUPROPION HCL ER (XL) 300 MG PO TB24
300.0000 mg | ORAL_TABLET | Freq: Every day | ORAL | 4 refills | Status: DC
  Filled 2023-03-31: qty 30, 30d supply, fill #0
  Filled 2023-04-30: qty 30, 30d supply, fill #1
  Filled 2023-05-31: qty 30, 30d supply, fill #2
  Filled 2023-06-28: qty 30, 30d supply, fill #3
  Filled 2023-07-27: qty 30, 30d supply, fill #4

## 2023-03-31 MED ORDER — BACLOFEN 10 MG PO TABS
10.0000 mg | ORAL_TABLET | Freq: Three times a day (TID) | ORAL | 0 refills | Status: DC
  Filled 2023-03-31: qty 90, 30d supply, fill #0

## 2023-04-05 DIAGNOSIS — D509 Iron deficiency anemia, unspecified: Secondary | ICD-10-CM | POA: Diagnosis not present

## 2023-04-12 ENCOUNTER — Other Ambulatory Visit (HOSPITAL_COMMUNITY): Payer: Self-pay

## 2023-04-12 ENCOUNTER — Other Ambulatory Visit: Payer: Self-pay

## 2023-04-13 ENCOUNTER — Other Ambulatory Visit (HOSPITAL_COMMUNITY): Payer: Self-pay

## 2023-04-13 ENCOUNTER — Encounter: Payer: Self-pay | Admitting: Physician Assistant

## 2023-04-13 MED ORDER — ESOMEPRAZOLE MAGNESIUM 40 MG PO CPDR
40.0000 mg | DELAYED_RELEASE_CAPSULE | Freq: Every day | ORAL | 0 refills | Status: DC
  Filled 2023-04-13: qty 90, 90d supply, fill #0

## 2023-04-21 ENCOUNTER — Other Ambulatory Visit (HOSPITAL_COMMUNITY): Payer: Self-pay

## 2023-04-30 ENCOUNTER — Other Ambulatory Visit (HOSPITAL_COMMUNITY): Payer: Self-pay

## 2023-04-30 MED ORDER — BACLOFEN 10 MG PO TABS
10.0000 mg | ORAL_TABLET | Freq: Three times a day (TID) | ORAL | 0 refills | Status: DC
  Filled 2023-04-30: qty 90, 30d supply, fill #0

## 2023-04-30 MED ORDER — IRBESARTAN 300 MG PO TABS
300.0000 mg | ORAL_TABLET | Freq: Every day | ORAL | 0 refills | Status: DC
  Filled 2023-04-30: qty 90, 90d supply, fill #0

## 2023-04-30 MED ORDER — CLONIDINE HCL 0.2 MG PO TABS
0.2000 mg | ORAL_TABLET | Freq: Two times a day (BID) | ORAL | 0 refills | Status: DC
  Filled 2023-04-30: qty 180, 90d supply, fill #0

## 2023-04-30 MED ORDER — AMLODIPINE BESYLATE 10 MG PO TABS
10.0000 mg | ORAL_TABLET | Freq: Every day | ORAL | 0 refills | Status: DC
  Filled 2023-06-28: qty 90, 90d supply, fill #0

## 2023-05-12 ENCOUNTER — Other Ambulatory Visit (HOSPITAL_COMMUNITY): Payer: Self-pay

## 2023-05-12 ENCOUNTER — Other Ambulatory Visit: Payer: Self-pay

## 2023-05-13 ENCOUNTER — Other Ambulatory Visit (HOSPITAL_COMMUNITY): Payer: Self-pay

## 2023-05-13 MED ORDER — CLONAZEPAM 1 MG PO TABS
1.0000 mg | ORAL_TABLET | Freq: Two times a day (BID) | ORAL | 0 refills | Status: DC | PRN
  Filled 2023-05-13: qty 60, 30d supply, fill #0

## 2023-05-13 MED ORDER — BUTALBITAL-APAP-CAFFEINE 50-325-40 MG PO TABS
1.0000 | ORAL_TABLET | Freq: Four times a day (QID) | ORAL | 0 refills | Status: DC | PRN
  Filled 2023-05-13: qty 60, 30d supply, fill #0

## 2023-05-14 ENCOUNTER — Other Ambulatory Visit (HOSPITAL_COMMUNITY): Payer: Self-pay

## 2023-05-31 ENCOUNTER — Other Ambulatory Visit (HOSPITAL_COMMUNITY): Payer: Self-pay

## 2023-05-31 MED ORDER — BACLOFEN 10 MG PO TABS
10.0000 mg | ORAL_TABLET | Freq: Three times a day (TID) | ORAL | 2 refills | Status: DC
  Filled 2023-05-31: qty 90, 30d supply, fill #0
  Filled 2023-06-28: qty 90, 30d supply, fill #1
  Filled 2023-07-27: qty 90, 30d supply, fill #2

## 2023-06-02 ENCOUNTER — Other Ambulatory Visit (HOSPITAL_COMMUNITY): Payer: Self-pay

## 2023-06-14 ENCOUNTER — Other Ambulatory Visit: Payer: Self-pay

## 2023-06-14 ENCOUNTER — Other Ambulatory Visit (HOSPITAL_COMMUNITY): Payer: Self-pay

## 2023-06-14 MED ORDER — BUTALBITAL-APAP-CAFFEINE 50-325-40 MG PO TABS
1.0000 | ORAL_TABLET | Freq: Four times a day (QID) | ORAL | 0 refills | Status: DC | PRN
  Filled 2023-06-14: qty 60, 15d supply, fill #0

## 2023-06-14 MED ORDER — CLONAZEPAM 1 MG PO TABS
1.0000 mg | ORAL_TABLET | Freq: Two times a day (BID) | ORAL | 0 refills | Status: DC | PRN
  Filled 2023-06-14: qty 5, 3d supply, fill #0
  Filled 2023-06-14: qty 55, 27d supply, fill #0

## 2023-06-16 ENCOUNTER — Ambulatory Visit: Payer: Commercial Managed Care - PPO | Admitting: Physician Assistant

## 2023-06-16 ENCOUNTER — Other Ambulatory Visit (INDEPENDENT_AMBULATORY_CARE_PROVIDER_SITE_OTHER): Payer: Commercial Managed Care - PPO

## 2023-06-16 ENCOUNTER — Encounter: Payer: Self-pay | Admitting: Physician Assistant

## 2023-06-16 ENCOUNTER — Other Ambulatory Visit (HOSPITAL_COMMUNITY): Payer: Self-pay

## 2023-06-16 VITALS — BP 140/88 | HR 97 | Ht 67.0 in

## 2023-06-16 DIAGNOSIS — D509 Iron deficiency anemia, unspecified: Secondary | ICD-10-CM

## 2023-06-16 DIAGNOSIS — K219 Gastro-esophageal reflux disease without esophagitis: Secondary | ICD-10-CM

## 2023-06-16 DIAGNOSIS — Z86010 Personal history of colonic polyps: Secondary | ICD-10-CM

## 2023-06-16 LAB — COMPREHENSIVE METABOLIC PANEL
ALT: 23 U/L (ref 0–35)
AST: 19 U/L (ref 0–37)
Albumin: 4.1 g/dL (ref 3.5–5.2)
Alkaline Phosphatase: 80 U/L (ref 39–117)
BUN: 14 mg/dL (ref 6–23)
CO2: 28 mEq/L (ref 19–32)
Calcium: 9.8 mg/dL (ref 8.4–10.5)
Chloride: 101 mEq/L (ref 96–112)
Creatinine, Ser: 0.93 mg/dL (ref 0.40–1.20)
GFR: 68.1 mL/min (ref >60.00)
Glucose, Bld: 164 mg/dL — ABNORMAL HIGH (ref 70–99)
Potassium: 3.6 mEq/L (ref 3.5–5.1)
Sodium: 138 mEq/L (ref 135–145)
Total Bilirubin: 0.3 mg/dL (ref 0.2–1.2)
Total Protein: 7 g/dL (ref 6.0–8.3)

## 2023-06-16 LAB — CBC WITH DIFFERENTIAL/PLATELET
Basophils Absolute: 0.1 10*3/uL (ref 0.0–0.1)
Basophils Relative: 0.6 % (ref 0.0–3.0)
Eosinophils Absolute: 0.2 10*3/uL (ref 0.0–0.7)
Eosinophils Relative: 2.3 % (ref 0.0–5.0)
HCT: 30.6 % — ABNORMAL LOW (ref 36.0–46.0)
Hemoglobin: 9.2 g/dL — ABNORMAL LOW (ref 12.0–15.0)
Lymphocytes Relative: 26.5 % (ref 12.0–46.0)
Lymphs Abs: 2.6 10*3/uL (ref 0.7–4.0)
MCHC: 29.9 g/dL — ABNORMAL LOW (ref 30.0–36.0)
MCV: 61.9 fl — ABNORMAL LOW (ref 78.0–100.0)
Monocytes Absolute: 0.8 10*3/uL (ref 0.1–1.0)
Monocytes Relative: 8 % (ref 3.0–12.0)
Neutro Abs: 6 10*3/uL (ref 1.4–7.7)
Neutrophils Relative %: 62.6 % (ref 43.0–77.0)
Platelets: 293 10*3/uL (ref 150.0–400.0)
RBC: 4.95 Mil/uL (ref 3.87–5.11)
RDW: 19.1 % — ABNORMAL HIGH (ref 11.5–15.5)
WBC: 9.6 10*3/uL (ref 4.0–10.5)

## 2023-06-16 LAB — IBC + FERRITIN
Ferritin: 2.9 ng/mL — ABNORMAL LOW (ref 10.0–291.0)
Iron: 19 ug/dL — ABNORMAL LOW (ref 42–145)
Saturation Ratios: 3.6 % — ABNORMAL LOW (ref 20.0–50.0)
TIBC: 525 ug/dL — ABNORMAL HIGH (ref 250.0–450.0)
Transferrin: 375 mg/dL — ABNORMAL HIGH (ref 212.0–360.0)

## 2023-06-16 LAB — VITAMIN B12: Vitamin B-12: 297 pg/mL (ref 211–911)

## 2023-06-16 LAB — FOLATE: Folate: 9.8 ng/mL (ref >5.9)

## 2023-06-16 MED ORDER — NA SULFATE-K SULFATE-MG SULF 17.5-3.13-1.6 GM/177ML PO SOLN
ORAL | 0 refills | Status: DC
  Filled 2023-06-16: qty 354, 1d supply, fill #0

## 2023-06-16 NOTE — Patient Instructions (Addendum)
Your provider has requested that you go to the basement level for lab work before leaving today. Press "B" on the elevator. The lab is located at the first door on the left as you exit the elevator.   You have been scheduled for an endoscopy and colonoscopy. Please follow the written instructions given to you at your visit today.  Please pick up your prep supplies at the pharmacy within the next 1-3 days.  If you use inhalers (even only as needed), please bring them with you on the day of your procedure.  DO NOT TAKE 7 DAYS PRIOR TO TEST- Trulicity (dulaglutide) Ozempic, Wegovy (semaglutide) Mounjaro (tirzepatide) Bydureon Bcise (exanatide extended release)  DO NOT TAKE 1 DAY PRIOR TO YOUR TEST Rybelsus (semaglutide) Adlyxin (lixisenatide) Victoza (liraglutide) Byetta (exanatide)  We have sent the following medications to your pharmacy for you to pick up at your convenience: Suprep  _______________________________________________________  If your blood pressure at your visit was 140/90 or greater, please contact your primary care physician to follow up on this.  _______________________________________________________  If you are age 2 or older, your body mass index should be between 23-30. Your Body mass index is 33.05 kg/m. If this is out of the aforementioned range listed, please consider follow up with your Primary Care Provider.  If you are age 12 or younger, your body mass index should be between 19-25. Your Body mass index is 33.05 kg/m. If this is out of the aformentioned range listed, please consider follow up with your Primary Care Provider.   ________________________________________________________  The [facility] providers would like to encourage you to use [facility] to communicate with providers for non-urgent requests or questions.  Due to long hold times on the telephone, sending your provider a message by [facility] may be a faster and more efficient way to get a  response.  Please allow 48 business hours for a response.  Please remember that this is for non-urgent requests.  _______________________________________________________   Due to recent changes in healthcare laws, you may see the results of your imaging and laboratory studies on MyChart before your provider has had a chance to review them.  We understand that in some cases there may be results that are confusing or concerning to you. Not all laboratory results come back in the same time frame and the provider may be waiting for multiple results in order to interpret others.  Please give [place] 48 hours in order for your provider to thoroughly review all the results before contacting the office for clarification of your results.    Thank you for entrusting me with your care and choosing [facility].  Hyacinth Meeker PA-C

## 2023-06-16 NOTE — Progress Notes (Addendum)
Chief Complaint: Anemia, discuss colonoscopy  HPI:    Theresa Henry is a 57 year old female with a past medical history of reflux and others listed below, known to Dr. Russella Dar, who was referred to me by Noberto Retort, MD for a complaint of anemia.    01/21/2018 colonoscopy done for family history of colorectal cancer in multiple second-degree relatives with two 5-8 mm polyps in the rectum and descending colon and small nonbleeding angiodysplasia in the ascending colon.  Repeat recommended in 5 years.  Pathology showed tubular adenomas.    Today, the patient presents to clinic and tells me that she knows she is due for her colonoscopy and would like to have an endoscopy as well given that she is anemic.  She tells me over the past 6 months to a year her hemoglobin has slowly been dropping, her last check within the past 3 months was about 9.5 per her ( we do not have labs at time of visit).  She tells me she does not feel any differently but she has a very sedentary lifestyle working from home and only gets up to go to the bathroom, she has not noticed any change in her daily activities.  Does have reflux which is controlled with generic Nexium 40 mg once daily which she has been on for years.  If she misses a dose she does have symptoms.  Denies seeing any blood in her stool.    Denies fever, chills, weight loss, abdominal pain, nausea, vomiting or symptoms that awaken her from sleep.  Past Medical History:  Diagnosis Date   Allergy    Anxiety    GERD (gastroesophageal reflux disease)    Hypertension    Migraine    Obesity     Past Surgical History:  Procedure Laterality Date   ABDOMINAL HYSTERECTOMY     per pt-had vaginal hysterectomy!   BREAST REDUCTION SURGERY     CARPAL TUNNEL RELEASE     right hand done 01/2017/left CTR 12/202018   CERVICAL DISC SURGERY  02/05/2017   [id number]   LAPAROSCOPY     REDUCTION MAMMAPLASTY Bilateral 2001   TYMPANOSTOMY TUBE PLACEMENT     WISDOM TOOTH  EXTRACTION      Current Outpatient Medications  Medication Sig Dispense Refill   amLODipine (NORVASC) 10 MG tablet Take 1 tablet (10 mg total) by mouth daily. 90 tablet 0   baclofen (LIORESAL) 10 MG tablet Take 1 tablet (10 mg total) by mouth 3 (three) times daily with food or milk. 90 tablet 2   buPROPion (WELLBUTRIN XL) 150 MG 24 hr tablet Take 150 mg by mouth daily.     buPROPion (WELLBUTRIN XL) 300 MG 24 hr tablet Take 1 tablet (300 mg total) by mouth daily. 30 tablet 4   butalbital-acetaminophen-caffeine (FIORICET) 50-325-40 MG tablet Take 1 tablet every 6 hours as needed. 60 tablet 3   butalbital-acetaminophen-caffeine (FIORICET) 50-325-40 MG tablet Take 1 tablet by mouth every 6 (six) hours as needed. 60 tablet 0   butalbital-acetaminophen-caffeine (FIORICET, ESGIC) 50-325-40 MG per tablet Take 1 tablet by mouth 2 (two) times daily as needed for headache (headache).      cephALEXin (KEFLEX) 500 MG capsule Take 1 capsule by mouth every 6 hours for 3 days. 12 capsule 0   cetirizine (ZYRTEC) 10 MG tablet Take 10 mg by mouth at bedtime.     clonazePAM (KLONOPIN) 1 MG tablet clonazepam 1 mg tablet     clonazePAM (KLONOPIN) 1 MG tablet Take 1  tablet by mouth twice daily as needed for anxiety 60 tablet 3   clonazePAM (KLONOPIN) 1 MG tablet Take 1 tablet (1 mg total) by mouth 2 (two) times daily as needed for anxiety 60 tablet 3   clonazePAM (KLONOPIN) 1 MG tablet Take 1 tablet by mouth twice a day as needed for anxiety 30 days 60 tablet 3   clonazePAM (KLONOPIN) 1 MG tablet Take 1 tablet (1 mg) by mouth 2 times daily as needed for anxiety 60 tablet 0   cloNIDine (CATAPRES) 0.2 MG tablet Take 1 tablet (0.2 mg total) by mouth 2 (two) times daily. 180 tablet 0   clotrimazole-betamethasone (LOTRISONE) cream Apply to affected areas 2 times a day for 14 days 15 g 1   escitalopram (LEXAPRO) 20 MG tablet Take 1 tablet by mouth daily. 30 tablet 5   escitalopram (LEXAPRO) 20 MG tablet Take 1 tablet (20  mg total) by mouth daily. 30 tablet 5   esomeprazole (NEXIUM) 40 MG capsule Take 40 mg by mouth daily at 6 (six) AM.      esomeprazole (NEXIUM) 40 MG capsule Take 1 capsule (40 mg total) by mouth daily. 90 capsule 0   estradiol (VIVELLE-DOT) 0.075 MG/24HR Apply 1 patch twice weekly as directed 24 patch 90   furosemide (LASIX) 20 MG tablet Take 1 tablet (20 mg total) by mouth daily as needed. 30 tablet 0   hydrocortisone ointment 0.5 % Apply 1 application topically 2 (two) times daily.     irbesartan (AVAPRO) 300 MG tablet Take 300 mg by mouth daily.     irbesartan (AVAPRO) 300 MG tablet Take 1 tablet (300 mg total) by mouth daily. 90 tablet 0   metFORMIN (GLUCOPHAGE-XR) 500 MG 24 hr tablet Take 1 tablet (500 mg total) by mouth every evening with a meal. 30 tablet 5   methocarbamol (ROBAXIN) 500 MG tablet Take 1 tablet by mouth 4 times a day 30 tablet 0   MINIVELLE 0.075 MG/24HR Place 1 patch onto the skin 2 (two) times a week.  12   oxyCODONE (OXY IR/ROXICODONE) 5 MG immediate release tablet Take 1 tablet by mouth every 4 hours 30 tablet 0   predniSONE (DELTASONE) 10 MG tablet Take 4 tablets by mouth daily for 3 days, then 3 tablets daily for 3 days, then 2 tabs daily for 3 days, then 1 tab daily for 3 days. 30 tablet 0   predniSONE (DELTASONE) 10 MG tablet Take 4 tablets by mouth daily for 3 days - 3 tablets daily for 3 days- 2 tablets daily for 3 days-1 tablet daily for 3 days. 30 tablet 0   promethazine (PHENERGAN) 25 MG tablet Take 25 mg by mouth every 6 (six) hours as needed for nausea (nausea).      promethazine (PHENERGAN) 25 MG tablet Take 1 tablet by mouth every 12 hours as needed 60 tablet 3   ranitidine (ZANTAC) 150 MG tablet Take 150 mg by mouth as needed for heartburn.     temazepam (RESTORIL) 30 MG capsule Take 30 mg by mouth at bedtime.  1   temazepam (RESTORIL) 30 MG capsule Take 1 capsule by mouth once daily at bedtime as needed 90 capsule 1   temazepam (RESTORIL) 30 MG capsule  Take 1 capsule by mouth at bedtime as needed. 90 capsule 1   temazepam (RESTORIL) 30 MG capsule Take 1 capsule by mouth at bedtime as needed 90 capsule 1   temazepam (RESTORIL) 30 MG capsule Take 1 capsule (30 mg total)  by mouth at bedtime as needed. 90 capsule 1   tiZANidine (ZANAFLEX) 4 MG tablet Take 4 mg by mouth every 8 (eight) hours as needed.     traMADol (ULTRAM) 50 MG tablet Take 1 tablet by mouth 2 times a day for headache 30 tablet 0   Vitamin E 100 UNIT/GM CREA Apply 1 g topically daily.     clonazePAM (KLONOPIN) 1 MG tablet TAKE 1 TABLET BY MOUTH 2 TIMES DAILY AS NEEDED FOR ANXIETY 60 tablet 3   cloNIDine (CATAPRES) 0.2 MG tablet TAKE 1 TABLET BY MOUTH 2 TIMES DAILY 180 tablet 1   cloNIDine (CATAPRES) 0.2 MG tablet TAKE 1 TABLET BY MOUTH 2 TIMES DAILY 180 tablet 0   escitalopram (LEXAPRO) 10 MG tablet TAKE 1 TABLET BY MOUTH DAILY 30 tablet 5   esomeprazole (NEXIUM) 40 MG capsule TAKE 1 CAPSULE BY MOUTH ONCE A DAY 90 capsule 1   irbesartan (AVAPRO) 300 MG tablet TAKE 1 TABLET BY MOUTH ONCE A DAY 90 tablet 1   irbesartan (AVAPRO) 300 MG tablet TAKE 1 TABLET BY MOUTH ONCE DAILY 90 tablet 1   promethazine (PHENERGAN) 25 MG tablet TAKE 1 TABLET BY MOUTH EVERY 12 HOURS AS NEEDED 60 tablet 3   temazepam (RESTORIL) 30 MG capsule TAKE 1 CAPSULE BY MOUTH AT BEDTIME AS NEEDED 90 capsule 1   Current Facility-Administered Medications  Medication Dose Route Frequency Provider Last Rate Last Admin   0.9 %  sodium chloride infusion  500 mL Intravenous Once Meryl Dare, MD        Allergies as of 06/16/2023 - Review Complete 06/16/2023  Allergen Reaction Noted   Nitrous oxide Swelling and Other (See Comments) 07/06/2017   Diphenhydramine Other (See Comments) 05/18/2015   Sulfa antibiotics  01/05/2014   Sulfamethoxazole-trimethoprim Other (See Comments) 03/22/2015   Amoxicillin Rash 06/06/2020   Amoxicillin-pot clavulanate Rash 03/22/2015   Band-aid friction block [foot care products]   01/07/2018   Erythromycin base Other (See Comments) 06/06/2020   Penicillins Rash 01/05/2014    Family History  Problem Relation Age of Onset   Ovarian cancer Mother    High Cholesterol Father    Heart disease Father    Migraines Brother    Migraines Paternal Grandfather    Migraines Daughter    Breast cancer Maternal Aunt        in 27's or 63's    Social History   Socioeconomic History   Marital status: Married    Spouse name: Not on file   Number of children: Not on file   Years of education: Not on file   Highest education level: Not on file  Occupational History   Not on file  Tobacco Use   Smoking status: Never   Smokeless tobacco: Never  Substance and Sexual Activity   Alcohol use: No   Drug use: No   Sexual activity: Not on file  Other Topics Concern   Not on file  Social History Narrative   Not on file   Social Determinants of Health   Financial Resource Strain: Not on file  Food Insecurity: Not on file  Transportation Needs: Not on file  Physical Activity: Not on file  Stress: Not on file  Social Connections: Not on file  Intimate Partner Violence: Not on file    Review of Systems:    Constitutional: No weight loss, fever or chills Skin: No rash or itching Cardiovascular: No chest pain Respiratory: No SOB  Gastrointestinal: See HPI and otherwise negative  Genitourinary: No dysuria  Neurological: No headache, dizziness or syncope Musculoskeletal: No new muscle or joint pain Hematologic: No bleeding or bruising Psychiatric: No history of depression or anxiety   Physical Exam:  Vital signs: BP (!) 140/88   Pulse 97   Ht 5\' 7"  (1.702 m)   BMI 33.05 kg/m   Constitutional:   Pleasant overweight Caucasian female appears to be in NAD, Well developed, Well nourished, alert and cooperative Head:  Normocephalic and atraumatic. Eyes:   PEERL, EOMI. No icterus. Conjunctiva pink. Ears:  Normal auditory acuity. Neck:  Supple Throat: Oral cavity and  pharynx without inflammation, swelling or lesion.  Respiratory: Respirations even and unlabored. Lungs clear to auscultation bilaterally.   No wheezes, crackles, or rhonchi.  Cardiovascular: Normal S1, S2. No MRG. Regular rate and rhythm. No peripheral edema, cyanosis or pallor.  Gastrointestinal:  Soft, nondistended, nontender. No rebound or guarding. Normal bowel sounds. No appreciable masses or hepatomegaly. Rectal:  Not performed.  Msk:  Symmetrical without gross deformities. Without edema, no deformity or joint abnormality.  Neurologic:  Alert and  oriented x4;  grossly normal neurologically.  Skin:   Dry and intact without significant lesions or rashes. Psychiatric: Demonstrates good judgement and reason without abnormal affect or behaviors.  Requesting recent labs and imaging  Assessment: 1.  Iron deficiency anemia: per patient hemoglobin slowly dropping to 9.5 at last check 2 to 3 months ago, been dropping over the past 6 to 12 months, prior colonoscopy with AVM which could be contributing 2.  History of adenomatous polyps: 2 adenomas at time of last colonoscopy, repeat due now 3.  Family history of colon cancer: In multiple second-degree relatives, last colonoscopy done 5 years ago, repeat due now  Plan: 1.  Scheduled patient for diagnostic EGD and colonoscopy in the [facility] with Dr. Russella Dar given history of adenomatous polyps, family history of colon cancer and iron deficiency anemia.  Did provide patient with a detailed list of risks of the procedure and she agrees to proceed. Patient is appropriate for endoscopic procedure(s) in the ambulatory ([facility]) setting.  2.  Ordered labs to include a CBC, CMP, iron studies, B12 and folate 3.  Patient tells me she cannot tolerate oral iron, would recommend referral to hematology pending iron studies for iron infusion 4.  Continue Nexium 40 mg daily 5.  Patient to follow in clinic per recommendations from Dr. Russella Dar after time of procedure.  Hyacinth Meeker, PA-C [facility] 06/16/2023, 2:26 PM  Cc: Noberto Retort, MD   Addendum: 06/16/2023 3:26 PM  04/05/2023 ferritin 3.6, hemoglobin 9.0, MCV low at 65.5, platelets normal.  No change in plans.  Hyacinth Meeker, PA-C

## 2023-06-18 ENCOUNTER — Telehealth: Payer: Self-pay | Admitting: Physician Assistant

## 2023-06-18 ENCOUNTER — Other Ambulatory Visit: Payer: Self-pay

## 2023-06-18 DIAGNOSIS — D509 Iron deficiency anemia, unspecified: Secondary | ICD-10-CM

## 2023-06-18 NOTE — Telephone Encounter (Signed)
Inbound call from patient requesting a call back regarding questions and concerns she has regarding iron infusions. Please advise, thank you.

## 2023-06-18 NOTE — Telephone Encounter (Signed)
Returned call to patient. Pt had several questions about iron infusions such as how long she will have to have them, how long do the infusions take, side effects of IV iron (black stools), etc. I explained to patient that the Hematologist will determine how many rounds of IV iron she will need. This will be based on how she responds to the infusions and if her Iron and Hgb are improving significantly. Plan is to keep procedures as scheduled for September. Hematology will let us know if her labs show anything concerning and if her procedures need to be moved up to a sooner appt. Pt is aware that Hematology office will review all of this information with her when they call her. Pt verbalized understanding and had no concerns at need of the call.

## 2023-06-21 NOTE — Progress Notes (Deleted)
[facility] Cancer Initial Visit:  Patient Care Team: Noberto Retort, MD as PCP - General (Family Medicine)  CHIEF COMPLAINTS/PURPOSE OF CONSULTATION:  Oncology History   No history exists.    HISTORY OF PRESENTING ILLNESS: Theresa Henry 57 y.o. female is here because of  anemia Medical history notable for GERD, hypertension, migraines, obesity   June 16 2023: WBC 9.6 hemoglobin 9.2 MCV 62 platelet count 239; 63 segs 27 lymphs 8 monos 2 eos 1 basophil Folate 9.8.  B12 297 ferritin 3 MP notable for glucose of 164  Review of Systems - Oncology  MEDICAL HISTORY: Past Medical History:  Diagnosis Date   Allergy    Anxiety    GERD (gastroesophageal reflux disease)    Hypertension    Migraine    Obesity     SURGICAL HISTORY: Past Surgical History:  Procedure Laterality Date   ABDOMINAL HYSTERECTOMY     per pt-had vaginal hysterectomy!   BREAST REDUCTION SURGERY     CARPAL TUNNEL RELEASE     right hand done 01/2017/left CTR 12/202018   CERVICAL DISC SURGERY  02/05/2017   c4-c56   CERVICAL DISCECTOMY     LAPAROSCOPY     LUMBAR MICRODISCECTOMY     REDUCTION MAMMAPLASTY Bilateral 2001   TYMPANOSTOMY TUBE PLACEMENT     WISDOM TOOTH EXTRACTION      SOCIAL HISTORY: Social History   Socioeconomic History   Marital status: Married    Spouse name: Not on file   Number of children: 1   Years of education: Not on file   Highest education level: Not on file  Occupational History   Not on file  Tobacco Use   Smoking status: Never   Smokeless tobacco: Never  Vaping Use   Vaping Use: Never used  Substance and Sexual Activity   Alcohol use: No   Drug use: No   Sexual activity: Not on file  Other Topics Concern   Not on file  Social History Narrative   Not on file   Social Determinants of Health   Financial Resource Strain: Not on file  Food Insecurity: Not on file  Transportation Needs: Not on file  Physical Activity: Not on file  Stress:  Not on file  Social Connections: Not on file  Intimate Partner Violence: Not on file    FAMILY HISTORY Family History  Problem Relation Age of Onset   Ovarian cancer Mother    High Cholesterol Father    Heart disease Father    Migraines Brother    Migraines Paternal Grandfather    Migraines Daughter    Breast cancer Maternal Aunt        in 61's or 64's   Liver disease Neg Hx    Colon cancer Neg Hx    Esophageal cancer Neg Hx     ALLERGIES:  is allergic to nitrous oxide, diphenhydramine, sulfa antibiotics, sulfamethoxazole-trimethoprim, amoxicillin, amoxicillin-pot clavulanate, band-aid friction block [foot care products], erythromycin base, and penicillins.  MEDICATIONS:  Current Outpatient Medications  Medication Sig Dispense Refill   amLODipine (NORVASC) 10 MG tablet Take 1 tablet (10 mg total) by mouth daily. 90 tablet 0   aspirin-acetaminophen-caffeine (EXCEDRIN MIGRAINE) 250-250-65 MG tablet 2 tablets Orally Once a day     baclofen (LIORESAL) 10 MG tablet Take 1 tablet (10 mg total) by mouth 3 (three) times daily with food or milk. 90 tablet 2   buPROPion (WELLBUTRIN XL) 150 MG 24 hr tablet Take 150 mg by mouth daily.  buPROPion (WELLBUTRIN XL) 300 MG 24 hr tablet Take 1 tablet (300 mg total) by mouth daily. 30 tablet 4   butalbital-acetaminophen-caffeine (FIORICET) 50-325-40 MG tablet Take 1 tablet every 6 hours as needed. 60 tablet 3   butalbital-acetaminophen-caffeine (FIORICET) 50-325-40 MG tablet Take 1 tablet by mouth every 6 (six) hours as needed. 60 tablet 0   butalbital-acetaminophen-caffeine (FIORICET, ESGIC) 50-325-40 MG per tablet Take 1 tablet by mouth 2 (two) times daily as needed for headache (headache).      cephALEXin (KEFLEX) 500 MG capsule Take 1 capsule by mouth every 6 hours for 3 days. 12 capsule 0   cetirizine (ZYRTEC) 10 MG tablet Take 10 mg by mouth at bedtime.     clonazePAM (KLONOPIN) 1 MG tablet clonazepam 1 mg tablet     clonazePAM (KLONOPIN)  1 MG tablet TAKE 1 TABLET BY MOUTH 2 TIMES DAILY AS NEEDED FOR ANXIETY 60 tablet 3   clonazePAM (KLONOPIN) 1 MG tablet Take 1 tablet by mouth twice daily as needed for anxiety 60 tablet 3   clonazePAM (KLONOPIN) 1 MG tablet Take 1 tablet (1 mg total) by mouth 2 (two) times daily as needed for anxiety 60 tablet 3   clonazePAM (KLONOPIN) 1 MG tablet Take 1 tablet by mouth twice a day as needed for anxiety 30 days 60 tablet 3   clonazePAM (KLONOPIN) 1 MG tablet Take 1 tablet (1 mg) by mouth 2 times daily as needed for anxiety 60 tablet 0   cloNIDine (CATAPRES) 0.2 MG tablet TAKE 1 TABLET BY MOUTH 2 TIMES DAILY 180 tablet 1   cloNIDine (CATAPRES) 0.2 MG tablet TAKE 1 TABLET BY MOUTH 2 TIMES DAILY 180 tablet 0   cloNIDine (CATAPRES) 0.2 MG tablet Take 1 tablet (0.2 mg total) by mouth 2 (two) times daily. 180 tablet 0   clotrimazole-betamethasone (LOTRISONE) cream Apply to affected areas 2 times a day for 14 days 15 g 1   escitalopram (LEXAPRO) 10 MG tablet TAKE 1 TABLET BY MOUTH DAILY 30 tablet 5   escitalopram (LEXAPRO) 20 MG tablet Take 1 tablet by mouth daily. 30 tablet 5   escitalopram (LEXAPRO) 20 MG tablet Take 1 tablet (20 mg total) by mouth daily. 30 tablet 5   esomeprazole (NEXIUM) 40 MG capsule Take 40 mg by mouth daily at 6 (six) AM.      esomeprazole (NEXIUM) 40 MG capsule TAKE 1 CAPSULE BY MOUTH ONCE A DAY 90 capsule 1   esomeprazole (NEXIUM) 40 MG capsule Take 1 capsule (40 mg total) by mouth daily. 90 capsule 0   estradiol (VIVELLE-DOT) 0.075 MG/24HR Apply 1 patch twice weekly as directed 24 patch 90   furosemide (LASIX) 20 MG tablet Take 1 tablet (20 mg total) by mouth daily as needed. 30 tablet 0   hydrocortisone ointment 0.5 % Apply 1 application topically 2 (two) times daily.     irbesartan (AVAPRO) 300 MG tablet Take 300 mg by mouth daily.     irbesartan (AVAPRO) 300 MG tablet TAKE 1 TABLET BY MOUTH ONCE A DAY 90 tablet 1   irbesartan (AVAPRO) 300 MG tablet TAKE 1 TABLET BY MOUTH  ONCE DAILY 90 tablet 1   irbesartan (AVAPRO) 300 MG tablet Take 1 tablet (300 mg total) by mouth daily. 90 tablet 0   metFORMIN (GLUCOPHAGE-XR) 500 MG 24 hr tablet Take 1 tablet (500 mg total) by mouth every evening with a meal. 30 tablet 5   methocarbamol (ROBAXIN) 500 MG tablet Take 1 tablet by mouth 4 times  a day 30 tablet 0   MINIVELLE 0.075 MG/24HR Place 1 patch onto the skin 2 (two) times a week.  12   Na Sulfate-K Sulfate-Mg Sulf 17.5-3.13-1.6 GM/177ML SOLN Use as directed 354 mL 0   oxyCODONE (OXY IR/ROXICODONE) 5 MG immediate release tablet Take 1 tablet by mouth every 4 hours 30 tablet 0   predniSONE (DELTASONE) 10 MG tablet Take 4 tablets by mouth daily for 3 days, then 3 tablets daily for 3 days, then 2 tabs daily for 3 days, then 1 tab daily for 3 days. 30 tablet 0   predniSONE (DELTASONE) 10 MG tablet Take 4 tablets by mouth daily for 3 days - 3 tablets daily for 3 days- 2 tablets daily for 3 days-1 tablet daily for 3 days. 30 tablet 0   promethazine (PHENERGAN) 25 MG tablet Take 25 mg by mouth every 6 (six) hours as needed for nausea (nausea).      promethazine (PHENERGAN) 25 MG tablet TAKE 1 TABLET BY MOUTH EVERY 12 HOURS AS NEEDED 60 tablet 3   promethazine (PHENERGAN) 25 MG tablet Take 1 tablet by mouth every 12 hours as needed 60 tablet 3   ranitidine (ZANTAC) 150 MG tablet Take 150 mg by mouth as needed for heartburn.     temazepam (RESTORIL) 30 MG capsule Take 30 mg by mouth at bedtime.  1   temazepam (RESTORIL) 30 MG capsule TAKE 1 CAPSULE BY MOUTH AT BEDTIME AS NEEDED 90 capsule 1   temazepam (RESTORIL) 30 MG capsule Take 1 capsule by mouth once daily at bedtime as needed 90 capsule 1   temazepam (RESTORIL) 30 MG capsule Take 1 capsule by mouth at bedtime as needed. 90 capsule 1   temazepam (RESTORIL) 30 MG capsule Take 1 capsule by mouth at bedtime as needed 90 capsule 1   temazepam (RESTORIL) 30 MG capsule Take 1 capsule (30 mg total) by mouth at bedtime as needed. 90  capsule 1   tiZANidine (ZANAFLEX) 4 MG tablet Take 4 mg by mouth every 8 (eight) hours as needed.     traMADol (ULTRAM) 50 MG tablet Take 1 tablet by mouth 2 times a day for headache 30 tablet 0   Vitamin E 100 UNIT/GM CREA Apply 1 g topically daily.     Current Facility-Administered Medications  Medication Dose Route Frequency Provider Last Rate Last Admin   0.9 %  sodium chloride infusion  500 mL Intravenous Once Meryl Dare, MD        PHYSICAL EXAMINATION:  ECOG PERFORMANCE STATUS: {CHL ONC ECOG ZO:[id number]}   There were no vitals filed for this visit.  There were no vitals filed for this visit.   Physical Exam   LABORATORY DATA: I have personally reviewed the data as listed:  Appointment on 06/16/2023  Component Date Value Ref Range Status   Folate 06/16/2023 9.8  >5.9 ng/mL Final   Vitamin B-12 06/16/2023 297  211 - 911 pg/mL Final   Iron 06/16/2023 19 (L)  42 - 145 ug/dL Final   Transferrin 81/19/1478 375.0 (H)  212.0 - 360.0 mg/dL Final   Saturation Ratios 06/16/2023 3.6 (L)  20.0 - 50.0 % Final   Ferritin 06/16/2023 2.9 (L)  10.0 - 291.0 ng/mL Final   TIBC 06/16/2023 525.0 (H)  250.0 - 450.0 mcg/dL Final   Sodium 29/56/2130 138  135 - 145 mEq/L Final   Potassium 06/16/2023 3.6  3.5 - 5.1 mEq/L Final   Chloride 06/16/2023 101  96 - 112 mEq/L Final  CO2 06/16/2023 28  19 - 32 mEq/L Final   Glucose, Bld 06/16/2023 164 (H)  70 - 99 mg/dL Final   BUN 69/62/9528 14  6 - 23 mg/dL Final   Creatinine, Ser 06/16/2023 0.93  0.40 - 1.20 mg/dL Final   Total Bilirubin 06/16/2023 0.3  0.2 - 1.2 mg/dL Final   Alkaline Phosphatase 06/16/2023 80  39 - 117 U/L Final   AST 06/16/2023 19  0 - 37 U/L Final   ALT 06/16/2023 23  0 - 35 U/L Final   Total Protein 06/16/2023 7.0  6.0 - 8.3 g/dL Final   Albumin 41/32/4401 4.1  3.5 - 5.2 g/dL Final   GFR 02/72/5366 68.10  >60.00 mL/min Final   Calculated using the CKD-EPI Creatinine Equation (2021)   Calcium 06/16/2023 9.8  8.4 -  10.5 mg/dL Final   WBC 44/02/4741 9.6  4.0 - 10.5 K/uL Final   RBC 06/16/2023 4.95  3.87 - 5.11 Mil/uL Final   Hemoglobin 06/16/2023 9.2 Repeated and verified X2. (L)  12.0 - 15.0 g/dL Final   HCT 59/56/3875 30.6 Repeated and verified X2. (L)  36.0 - 46.0 % Final   MCV 06/16/2023 61.9 (L)  78.0 - 100.0 fl Final   MCHC 06/16/2023 29.9 (L)  30.0 - 36.0 g/dL Final   RDW 64/33/2951 19.1 (H)  11.5 - 15.5 % Final   Platelets 06/16/2023 293.0  150.0 - 400.0 K/uL Final   Neutrophils Relative % 06/16/2023 62.6  43.0 - 77.0 % Final   Lymphocytes Relative 06/16/2023 26.5  12.0 - 46.0 % Final   Monocytes Relative 06/16/2023 8.0  3.0 - 12.0 % Final   Eosinophils Relative 06/16/2023 2.3  0.0 - 5.0 % Final   Basophils Relative 06/16/2023 0.6  0.0 - 3.0 % Final   Neutro Abs 06/16/2023 6.0  1.4 - 7.7 K/uL Final   Lymphs Abs 06/16/2023 2.6  0.7 - 4.0 K/uL Final   Monocytes Absolute 06/16/2023 0.8  0.1 - 1.0 K/uL Final   Eosinophils Absolute 06/16/2023 0.2  0.0 - 0.7 K/uL Final   Basophils Absolute 06/16/2023 0.1  0.0 - 0.1 K/uL Final    RADIOGRAPHIC STUDIES: I have personally reviewed the radiological images as listed and agree with the findings in the report  No results found.  ASSESSMENT/PLAN Cancer Staging  No matching staging information was found for the patient.   No problem-specific Assessment & Plan notes found for this encounter.    No orders of the defined types were placed in this encounter.     minutes was spent in patient care.  This included time spent preparing to see the patient (e.g., review of tests), obtaining and/or reviewing separately obtained history, counseling and educating the patient/family/caregiver, ordering medications, tests, or procedures; documenting clinical information in the electronic or other health record, independently interpreting results and communicating results to the patient/family/caregiver as well as coordination of care.       All questions were  answered. The patient knows to call the clinic with any problems, questions or concerns.  This note was electronically signed.    Loni Muse, MD  06/21/2023 1:00 PM

## 2023-06-24 ENCOUNTER — Other Ambulatory Visit (HOSPITAL_COMMUNITY): Payer: Self-pay

## 2023-06-24 ENCOUNTER — Telehealth: Payer: Self-pay | Admitting: Oncology

## 2023-06-24 ENCOUNTER — Inpatient Hospital Stay: Payer: Commercial Managed Care - PPO

## 2023-06-24 ENCOUNTER — Inpatient Hospital Stay: Payer: Commercial Managed Care - PPO | Admitting: Oncology

## 2023-06-24 DIAGNOSIS — I10 Essential (primary) hypertension: Secondary | ICD-10-CM | POA: Diagnosis not present

## 2023-06-24 DIAGNOSIS — G43009 Migraine without aura, not intractable, without status migrainosus: Secondary | ICD-10-CM | POA: Diagnosis not present

## 2023-06-24 DIAGNOSIS — H6090 Unspecified otitis externa, unspecified ear: Secondary | ICD-10-CM | POA: Diagnosis not present

## 2023-06-24 NOTE — Telephone Encounter (Signed)
06/24/2023 Patient unaware she had an Appt today

## 2023-06-24 NOTE — Telephone Encounter (Signed)
06/24/23 Spoke with patient and rescheduled appt.

## 2023-06-26 ENCOUNTER — Other Ambulatory Visit (HOSPITAL_COMMUNITY): Payer: Self-pay

## 2023-06-28 ENCOUNTER — Other Ambulatory Visit: Payer: Self-pay

## 2023-06-28 ENCOUNTER — Other Ambulatory Visit (HOSPITAL_COMMUNITY): Payer: Self-pay

## 2023-06-28 MED ORDER — CIPRO HC 0.2-1 % OT SUSP
OTIC | 0 refills | Status: DC
  Filled 2023-06-28: qty 10, 10d supply, fill #0

## 2023-06-28 MED ORDER — CIPROFLOXACIN-DEXAMETHASONE 0.3-0.1 % OT SUSP
OTIC | 0 refills | Status: DC
  Filled 2023-06-28: qty 7.5, 7d supply, fill #0

## 2023-07-05 ENCOUNTER — Encounter: Payer: Self-pay | Admitting: Oncology

## 2023-07-05 ENCOUNTER — Other Ambulatory Visit: Payer: Self-pay | Admitting: Pharmacist

## 2023-07-05 ENCOUNTER — Inpatient Hospital Stay: Payer: Commercial Managed Care - PPO

## 2023-07-05 ENCOUNTER — Inpatient Hospital Stay: Payer: Commercial Managed Care - PPO | Attending: Oncology | Admitting: Oncology

## 2023-07-05 ENCOUNTER — Telehealth: Payer: Self-pay | Admitting: Oncology

## 2023-07-05 VITALS — BP 121/86 | HR 90 | Temp 98.1°F | Resp 18 | Ht 67.0 in | Wt 236.6 lb

## 2023-07-05 DIAGNOSIS — D539 Nutritional anemia, unspecified: Secondary | ICD-10-CM

## 2023-07-05 DIAGNOSIS — D500 Iron deficiency anemia secondary to blood loss (chronic): Secondary | ICD-10-CM | POA: Diagnosis not present

## 2023-07-05 DIAGNOSIS — K635 Polyp of colon: Secondary | ICD-10-CM | POA: Insufficient documentation

## 2023-07-05 DIAGNOSIS — Z803 Family history of malignant neoplasm of breast: Secondary | ICD-10-CM | POA: Diagnosis not present

## 2023-07-05 DIAGNOSIS — K5520 Angiodysplasia of colon without hemorrhage: Secondary | ICD-10-CM

## 2023-07-05 DIAGNOSIS — D126 Benign neoplasm of colon, unspecified: Secondary | ICD-10-CM

## 2023-07-05 DIAGNOSIS — Z8041 Family history of malignant neoplasm of ovary: Secondary | ICD-10-CM | POA: Diagnosis not present

## 2023-07-05 DIAGNOSIS — K922 Gastrointestinal hemorrhage, unspecified: Secondary | ICD-10-CM | POA: Insufficient documentation

## 2023-07-05 LAB — DIRECT ANTIGLOBULIN TEST (NOT AT ARMC)
DAT, IgG: NEGATIVE
DAT, complement: NEGATIVE

## 2023-07-05 LAB — VITAMIN B12: Vitamin B-12: 265 pg/mL (ref 180–914)

## 2023-07-05 LAB — RETICULOCYTES
Immature Retic Fract: 37.1 % — ABNORMAL HIGH (ref 2.3–15.9)
RBC.: 4.54 MIL/uL (ref 3.87–5.11)
Retic Count, Absolute: 91.7 10*3/uL (ref 19.0–186.0)
Retic Ct Pct: 2 % (ref 0.4–3.1)

## 2023-07-05 NOTE — Progress Notes (Signed)
[facility] Cancer Initial Visit:  Patient Care Team: Noberto Retort, MD as PCP - General (Family Medicine)  CHIEF COMPLAINTS/PURPOSE OF CONSULTATION:  HISTORY OF PRESENTING ILLNESS: Theresa Henry 57 y.o. female is here because of  anemia Medial history notable for [facility] January 21 2018:  Colonoscopy done for family history of colorectal cancer in multiple second-degree relatives with two 5-8 mm polyps in the rectum and descending colon and small nonbleeding angiodysplasia in the ascending colon. Repeat recommended in 5 years. Pathology showed tubular adenomas.   June 16 2023:  GI consult--Scheduled patient for diagnostic EGD and colonoscopy...given history of adenomatous polyps, family history of colon cancer and iron deficiency anemia.    WBC 9.6 hemoglobin 9.2 MCV 62 platelet count 293; 63 segs 27 lymphs 8 monos 2 eos CMP notable for glucose of 164  July 05 2023:  [facility] Hematology Consult  No history of hemorrhage requiring transfusion.  Has tried taking iron sulfate 325 mg daily which she can not tolerate.  Has not received IV iron/ required PRBC's in the past.  Does have a normal diet.  No history of hemorrhage postoperatively requiring transfusion.  No melena, hemoptysis, hematuria.  Has occasional hemorrhoids and notes occasional hematochezia.  No change in stool caliber, constipation.  No history of intra-articular or soft tissue bleeding.  Does not regularly use NSAIDS for pain.  Is not taking oral anticoagulants  Is not taking antiplatelet drugs.  No history of abnormal bleeding in family members.  Patient has symptoms of fatigue, pallor,  DOE, palpitations, decreased performance status.  Patient has pica to ice but not to starch/dirt.    August 18 2023:  Pan endoscopy  Social:  Works for [employer].  No tobacco or EtOH  [facility] Mother died 14 ovarian cancer Father alive 51 Alzheimer's Brother died 15 MI Paternal Uncle died with history of colon  cancer   Review of Systems - Oncology  MEDICAL HISTORY: Past Medical History:  Diagnosis Date   Allergy    Anxiety    GERD (gastroesophageal reflux disease)    Hypertension    Migraine    Obesity     SURGICAL HISTORY: Past Surgical History:  Procedure Laterality Date   ABDOMINAL HYSTERECTOMY     per pt-had vaginal hysterectomy!   BREAST REDUCTION SURGERY     CARPAL TUNNEL RELEASE     right hand done 01/2017/left CTR 12/202018   CERVICAL DISC SURGERY  02/05/2017   [id number]   CERVICAL DISCECTOMY     LAPAROSCOPY     LUMBAR MICRODISCECTOMY     REDUCTION MAMMAPLASTY Bilateral 2001   TYMPANOSTOMY TUBE PLACEMENT     WISDOM TOOTH EXTRACTION      SOCIAL HISTORY: Social History   Socioeconomic History   Marital status: Married    Spouse name: Brett Canales   Number of children: 1   Years of education: 12+4   Highest education level: Bachelor's degree (e.g., BA, AB, BS)  Occupational History   Not on file  Tobacco Use   Smoking status: Never   Smokeless tobacco: Never  Vaping Use   Vaping status: Never Used  Substance and Sexual Activity   Alcohol use: Never   Drug use: Never   Sexual activity: Not Currently  Other Topics Concern   Not on file  Social History Narrative   Not on file   Social Determinants of Health   Financial Resource Strain: Not on file  Food Insecurity: No Food Insecurity (07/05/2023)   Hunger  Vital Sign    Worried About Programme researcher, broadcasting/film/video in the Last Year: Never true    Ran Out of Food in the Last Year: Never true  Transportation Needs: Not on file  Physical Activity: Not on file  Stress: Not on file  Social Connections: Not on file  Intimate Partner Violence: Not on file    FAMILY HISTORY Family History  Problem Relation Age of Onset   Ovarian cancer Mother    High Cholesterol Father    Heart disease Father    Migraines Brother    Migraines Paternal Grandfather    Migraines Daughter    Breast cancer Maternal Aunt        in 9's or  74's   Liver disease Neg Hx    Colon cancer Neg Hx    Esophageal cancer Neg Hx     ALLERGIES:  is allergic to nitrous oxide, diphenhydramine, sulfa antibiotics, sulfamethoxazole-trimethoprim, amoxicillin, amoxicillin-pot clavulanate, band-aid friction block [foot care products], erythromycin base, and penicillins.  MEDICATIONS:  Current Outpatient Medications  Medication Sig Dispense Refill   amLODipine (NORVASC) 10 MG tablet Take 1 tablet (10 mg total) by mouth daily. 90 tablet 0   aspirin-acetaminophen-caffeine (EXCEDRIN MIGRAINE) 250-250-65 MG tablet 2 tablets Orally Once a day     baclofen (LIORESAL) 10 MG tablet Take 1 tablet (10 mg total) by mouth 3 (three) times daily with food or milk. 90 tablet 2   buPROPion (WELLBUTRIN XL) 300 MG 24 hr tablet Take 1 tablet (300 mg total) by mouth daily. 30 tablet 4   butalbital-acetaminophen-caffeine (FIORICET) 50-325-40 MG tablet Take 1 tablet by mouth every 6 (six) hours as needed. 60 tablet 0   clonazePAM (KLONOPIN) 1 MG tablet Take 1 tablet (1 mg) by mouth 2 times daily as needed for anxiety 60 tablet 0   cloNIDine (CATAPRES) 0.2 MG tablet Take 1 tablet (0.2 mg total) by mouth 2 (two) times daily. 180 tablet 0   clotrimazole-betamethasone (LOTRISONE) cream Apply to affected areas 2 times a day for 14 days 15 g 1   escitalopram (LEXAPRO) 20 MG tablet Take 1 tablet (20 mg total) by mouth daily. 30 tablet 5   esomeprazole (NEXIUM) 40 MG capsule Take 1 capsule (40 mg total) by mouth daily. 90 capsule 0   estradiol (VIVELLE-DOT) 0.075 MG/24HR Apply 1 patch twice weekly as directed 24 patch 90   irbesartan (AVAPRO) 300 MG tablet Take 1 tablet (300 mg total) by mouth daily. 90 tablet 0   metFORMIN (GLUCOPHAGE-XR) 500 MG 24 hr tablet Take 1 tablet (500 mg total) by mouth every evening with a meal. 30 tablet 5   MINIVELLE 0.075 MG/24HR Place 1 patch onto the skin 2 (two) times a week.  12   promethazine (PHENERGAN) 25 MG tablet Take 1 tablet by mouth  every 12 hours as needed 60 tablet 3   temazepam (RESTORIL) 30 MG capsule Take 1 capsule (30 mg total) by mouth at bedtime as needed. 90 capsule 1   Current Facility-Administered Medications  Medication Dose Route Frequency Provider Last Rate Last Admin   0.9 %  sodium chloride infusion  500 mL Intravenous Once Meryl Dare, MD        PHYSICAL EXAMINATION:  ECOG PERFORMANCE STATUS: 1 - Symptomatic but completely ambulatory   Vitals:   07/05/23 0832  BP: 121/86  Pulse: 90  Resp: 18  Temp: 98.1 F (36.7 C)  SpO2: 99%    Filed Weights   07/05/23 0832  Weight: 236  lb 9.6 oz (107.3 kg)     Physical Exam Vitals and nursing note reviewed.  Constitutional:      General: She is not in acute distress.    Appearance: Normal appearance. She is obese. She is not ill-appearing, toxic-appearing or diaphoretic.  HENT:     Head: Normocephalic and atraumatic.     Right Ear: External ear normal.     Left Ear: External ear normal.     Nose: Nose normal. No congestion or rhinorrhea.  Eyes:     General: No scleral icterus.    Extraocular Movements: Extraocular movements intact.     Conjunctiva/sclera: Conjunctivae normal.     Pupils: Pupils are equal, round, and reactive to light.  Cardiovascular:     Rate and Rhythm: Normal rate and regular rhythm.     Heart sounds: Normal heart sounds. No murmur heard.    No friction rub. No gallop.  Pulmonary:     Effort: Pulmonary effort is normal. No respiratory distress.     Breath sounds: Normal breath sounds. No wheezing or rhonchi.  Abdominal:     General: Bowel sounds are normal.     Palpations: Abdomen is soft.     Tenderness: There is no abdominal tenderness. There is no guarding or rebound.  Musculoskeletal:        General: No swelling, tenderness or deformity.     Cervical back: Normal range of motion and neck supple. No rigidity or tenderness.  Lymphadenopathy:     Head:     Right side of head: No submental, submandibular,  tonsillar, preauricular, posterior auricular or occipital adenopathy.     Left side of head: No submental, submandibular, tonsillar, preauricular, posterior auricular or occipital adenopathy.     Cervical: No cervical adenopathy.     Right cervical: No superficial, deep or posterior cervical adenopathy.    Left cervical: No superficial, deep or posterior cervical adenopathy.     Upper Body:     Right upper body: No supraclavicular, axillary, pectoral or epitrochlear adenopathy.     Left upper body: No supraclavicular, axillary, pectoral or epitrochlear adenopathy.  Skin:    General: Skin is warm.     Coloration: Skin is pale. Skin is not jaundiced.     Findings: No bruising.  Neurological:     General: No focal deficit present.     Mental Status: She is alert and oriented to person, place, and time.     Cranial Nerves: No cranial nerve deficit.     Gait: Gait normal.  Psychiatric:        Mood and Affect: Mood normal.        Behavior: Behavior normal.        Thought Content: Thought content normal.        Judgment: Judgment normal.    LABORATORY DATA: I have personally reviewed the data as listed:  Appointment on 06/16/2023  Component Date Value Ref Range Status   Folate 06/16/2023 9.8  >5.9 ng/mL Final   Vitamin B-12 06/16/2023 297  211 - 911 pg/mL Final   Iron 06/16/2023 19 (L)  42 - 145 ug/dL Final   Transferrin 12/16/7251 375.0 (H)  212.0 - 360.0 mg/dL Final   Saturation Ratios 06/16/2023 3.6 (L)  20.0 - 50.0 % Final   Ferritin 06/16/2023 2.9 (L)  10.0 - 291.0 ng/mL Final   TIBC 06/16/2023 525.0 (H)  250.0 - 450.0 mcg/dL Final   Sodium 66/44/0347 138  135 - 145 mEq/L Final   Potassium  06/16/2023 3.6  3.5 - 5.1 mEq/L Final   Chloride 06/16/2023 101  96 - 112 mEq/L Final   CO2 06/16/2023 28  19 - 32 mEq/L Final   Glucose, Bld 06/16/2023 164 (H)  70 - 99 mg/dL Final   BUN 16/09/9603 14  6 - 23 mg/dL Final   Creatinine, Ser 06/16/2023 0.93  0.40 - 1.20 mg/dL Final   Total  Bilirubin 06/16/2023 0.3  0.2 - 1.2 mg/dL Final   Alkaline Phosphatase 06/16/2023 80  39 - 117 U/L Final   AST 06/16/2023 19  0 - 37 U/L Final   ALT 06/16/2023 23  0 - 35 U/L Final   Total Protein 06/16/2023 7.0  6.0 - 8.3 g/dL Final   Albumin 54/08/8118 4.1  3.5 - 5.2 g/dL Final   GFR 14/78/2956 68.10  >60.00 mL/min Final   Calculated using the CKD-EPI Creatinine Equation (2021)   Calcium 06/16/2023 9.8  8.4 - 10.5 mg/dL Final   WBC 21/30/8657 9.6  4.0 - 10.5 K/uL Final   RBC 06/16/2023 4.95  3.87 - 5.11 Mil/uL Final   Hemoglobin 06/16/2023 9.2 Repeated and verified X2. (L)  12.0 - 15.0 g/dL Final   HCT 84/69/6295 30.6 Repeated and verified X2. (L)  36.0 - 46.0 % Final   MCV 06/16/2023 61.9 (L)  78.0 - 100.0 fl Final   MCHC 06/16/2023 29.9 (L)  30.0 - 36.0 g/dL Final   RDW 28/41/3244 19.1 (H)  11.5 - 15.5 % Final   Platelets 06/16/2023 293.0  150.0 - 400.0 K/uL Final   Neutrophils Relative % 06/16/2023 62.6  43.0 - 77.0 % Final   Lymphocytes Relative 06/16/2023 26.5  12.0 - 46.0 % Final   Monocytes Relative 06/16/2023 8.0  3.0 - 12.0 % Final   Eosinophils Relative 06/16/2023 2.3  0.0 - 5.0 % Final   Basophils Relative 06/16/2023 0.6  0.0 - 3.0 % Final   Neutro Abs 06/16/2023 6.0  1.4 - 7.7 K/uL Final   Lymphs Abs 06/16/2023 2.6  0.7 - 4.0 K/uL Final   Monocytes Absolute 06/16/2023 0.8  0.1 - 1.0 K/uL Final   Eosinophils Absolute 06/16/2023 0.2  0.0 - 0.7 K/uL Final   Basophils Absolute 06/16/2023 0.1  0.0 - 0.1 K/uL Final    RADIOGRAPHIC STUDIES: I have personally reviewed the radiological images as listed and agree with the findings in the report  No results found.  ASSESSMENT/PLAN   Patient is a 57 year old  female with symptomatic microcytic anemia presumed to be secondary to chronic blood loss.    Anemia:  Secondary to iron deficiency anemia from chronic GI blood loss.  Will obtain B12, folate, retic count,  DAT, Haptoglobin.   Has been evaluated by GI and to undergo  panendoscopy  Therapeutics:  Since patient has symptomatic anemia with Hgb < 10  and has not tolerated oral iron will arrange for IV iron replacement.   Given the severe symptoms we prefer to replete iron stores in one or two visits rather than over the course of several months.  In addition ongoing blood loss exceeds the capacity of oral iron to meet needs. A discussion regarding risks was had with the patient.  IV iron has the potential to cause allergic reactions, including potentially life-threatening anaphylaxis.   IV iron may be associated with non-allergic infusion reactions including self-limiting urticaria, palpitations, dizziness, and neck and back spasm; generally, these occur in <1 percent of individuals and do not progress to more serious reactions. The  non-allergic reaction consisting of flushing of the face and myalgias of the chest and back.   After discussion of the risks and benefits of IV iron therapy patient has elected to proceed with parenteral iron therapy.    Personal history of colon polyps and family history of ovarian cancer  July 05 2023- Refer to genetics for consideration of testing   Cancer Staging  No matching staging information was found for the patient.    No problem-specific Assessment & Plan notes found for this encounter.    No orders of the defined types were placed in this encounter.   45  minutes was spent in patient care.  This included time spent preparing to see the patient (e.g., review of tests), obtaining and/or reviewing separately obtained history, counseling and educating the patient/family/caregiver, ordering medications, tests, or procedures; documenting clinical information in the electronic or other health record, independently interpreting results and communicating results to the patient/family/caregiver as well as coordination of care.       All questions were answered. The patient knows to call the clinic with any problems, questions or  concerns.  This note was electronically signed.    Loni Muse, MD  07/05/2023 8:36 AM

## 2023-07-05 NOTE — Addendum Note (Signed)
Addended by: Domenic Schwab on: 07/05/2023 10:12 AM   Modules accepted: Orders

## 2023-07-05 NOTE — Telephone Encounter (Signed)
07/05/23 Spoke with patient and scheduled IV IRON and next DV.

## 2023-07-05 NOTE — Telephone Encounter (Signed)
Patient is aware of upcoming appointment times/dates.  

## 2023-07-05 NOTE — Progress Notes (Signed)
..  Feraheme orders changed to venofer due to insurance plan preference.  Message to scheduling has been sent.  

## 2023-07-07 LAB — HAPTOGLOBIN: Haptoglobin: 213 mg/dL (ref 33–346)

## 2023-07-14 ENCOUNTER — Other Ambulatory Visit (HOSPITAL_COMMUNITY): Payer: Self-pay

## 2023-07-15 ENCOUNTER — Other Ambulatory Visit (HOSPITAL_COMMUNITY): Payer: Self-pay

## 2023-07-15 MED ORDER — PROMETHAZINE HCL 25 MG PO TABS
25.0000 mg | ORAL_TABLET | Freq: Two times a day (BID) | ORAL | 0 refills | Status: DC | PRN
  Filled 2023-07-15: qty 60, 30d supply, fill #0

## 2023-07-15 MED ORDER — ESOMEPRAZOLE MAGNESIUM 40 MG PO CPDR
40.0000 mg | DELAYED_RELEASE_CAPSULE | Freq: Every day | ORAL | 0 refills | Status: DC
  Filled 2023-07-15: qty 90, 90d supply, fill #0

## 2023-07-15 MED ORDER — IRBESARTAN 300 MG PO TABS
300.0000 mg | ORAL_TABLET | Freq: Every day | ORAL | 0 refills | Status: DC
  Filled 2023-07-15: qty 90, 90d supply, fill #0

## 2023-07-15 MED ORDER — ESCITALOPRAM OXALATE 20 MG PO TABS
20.0000 mg | ORAL_TABLET | Freq: Every day | ORAL | 0 refills | Status: DC
  Filled 2023-07-15: qty 30, 30d supply, fill #0

## 2023-07-15 MED ORDER — CLONIDINE HCL 0.2 MG PO TABS
ORAL_TABLET | ORAL | 0 refills | Status: DC
  Filled 2023-07-15: qty 180, 90d supply, fill #0

## 2023-07-16 ENCOUNTER — Other Ambulatory Visit: Payer: Self-pay

## 2023-07-16 ENCOUNTER — Other Ambulatory Visit (HOSPITAL_COMMUNITY): Payer: Self-pay

## 2023-07-16 MED ORDER — BUTALBITAL-APAP-CAFFEINE 50-325-40 MG PO TABS
1.0000 | ORAL_TABLET | Freq: Four times a day (QID) | ORAL | 0 refills | Status: DC | PRN
  Filled 2023-07-16: qty 60, 15d supply, fill #0

## 2023-07-17 ENCOUNTER — Other Ambulatory Visit (HOSPITAL_COMMUNITY): Payer: Self-pay

## 2023-07-19 ENCOUNTER — Encounter: Payer: Self-pay | Admitting: Oncology

## 2023-07-19 MED FILL — Iron Sucrose Inj 20 MG/ML (Fe Equiv): INTRAVENOUS | Qty: 10 | Status: AC

## 2023-07-20 ENCOUNTER — Inpatient Hospital Stay: Payer: Commercial Managed Care - PPO | Attending: Oncology

## 2023-07-20 VITALS — BP 120/80 | HR 80 | Temp 98.8°F | Resp 20 | Ht 67.0 in | Wt 235.1 lb

## 2023-07-20 DIAGNOSIS — D500 Iron deficiency anemia secondary to blood loss (chronic): Secondary | ICD-10-CM | POA: Insufficient documentation

## 2023-07-20 DIAGNOSIS — K922 Gastrointestinal hemorrhage, unspecified: Secondary | ICD-10-CM | POA: Insufficient documentation

## 2023-07-20 DIAGNOSIS — D539 Nutritional anemia, unspecified: Secondary | ICD-10-CM

## 2023-07-20 MED ORDER — METHYLPREDNISOLONE SODIUM SUCC 125 MG IJ SOLR: 125.0000 mg | Freq: Once | INTRAMUSCULAR | Status: DC | PRN

## 2023-07-20 MED ORDER — SODIUM CHLORIDE 0.9 % IV SOLN: Freq: Once | INTRAVENOUS | Status: AC

## 2023-07-20 MED ORDER — EPINEPHRINE 0.3 MG/0.3ML IJ SOAJ: 0.3000 mg | Freq: Once | INTRAMUSCULAR | Status: DC | PRN

## 2023-07-20 MED ORDER — FAMOTIDINE IN NACL 20-0.9 MG/50ML-% IV SOLN: 20.0000 mg | Freq: Once | INTRAVENOUS | Status: DC | PRN

## 2023-07-20 MED ORDER — SODIUM CHLORIDE 0.9 % IV SOLN
200.0000 mg | Freq: Once | INTRAVENOUS | Status: AC
  Administered 2023-07-20: 200 mg via INTRAVENOUS
  Filled 2023-07-20: qty 200

## 2023-07-20 MED ORDER — DIPHENHYDRAMINE HCL 50 MG/ML IJ SOLN: 50.0000 mg | Freq: Once | INTRAMUSCULAR | Status: DC | PRN

## 2023-07-20 MED ORDER — ACETAMINOPHEN 325 MG PO TABS
650.0000 mg | ORAL_TABLET | Freq: Once | ORAL | Status: AC
  Administered 2023-07-20: 650 mg via ORAL
  Filled 2023-07-20: qty 2

## 2023-07-20 MED ORDER — LORATADINE 10 MG PO TABS
10.0000 mg | ORAL_TABLET | Freq: Once | ORAL | Status: AC
  Administered 2023-07-20: 10 mg via ORAL
  Filled 2023-07-20: qty 1

## 2023-07-20 MED ORDER — ALBUTEROL SULFATE HFA 108 (90 BASE) MCG/ACT IN AERS: 2.0000 | INHALATION_SPRAY | Freq: Once | RESPIRATORY_TRACT | Status: DC | PRN

## 2023-07-20 MED ORDER — SODIUM CHLORIDE 0.9 % IV SOLN: Freq: Once | INTRAVENOUS | Status: DC | PRN

## 2023-07-20 NOTE — Patient Instructions (Signed)
Iron-Rich Diet  Iron is a mineral that helps your body produce hemoglobin. Hemoglobin is a protein in red blood cells that carries oxygen to your body's tissues. Eating too little iron may cause you to feel weak and tired, and it can increase your risk of infection. Iron is naturally found in many foods, and many foods have iron added to them (are iron-fortified). You may need to follow an iron-rich diet if you do not have enough iron in your body due to certain medical conditions. The amount of iron that you need each day depends on your age, your sex, and any medical conditions you have. Follow instructions from your health care provider or a dietitian about how much iron you should eat each day. What are tips for following this plan? Reading food labels Check food labels to see how many milligrams (mg) of iron are in each serving. Cooking Cook foods in pots and pans that are made from iron. Take these steps to make it easier for your body to absorb iron from certain foods: Soak beans overnight before cooking. Soak whole grains overnight and drain them before using. Ferment flours before baking, such as by using yeast in bread dough. Meal planning When you eat foods that contain iron, you should eat them with foods that are high in vitamin C. These include oranges, peppers, tomatoes, potatoes, and mangoes. Vitamin C helps your body absorb iron. Certain foods and drinks prevent your body from absorbing iron properly. Avoid eating these foods in the same meal as iron-rich foods or with iron supplements. These foods include: Coffee, black tea, and red wine. Milk, dairy products, and foods that are high in calcium. Beans and soybeans. Whole grains. General information Take iron supplements only as told by your health care provider. An overdose of iron can be life-threatening. If you were prescribed iron supplements, take them with orange juice or a vitamin C supplement. When you eat  iron-fortified foods or take an iron supplement, you should also eat foods that naturally contain iron, such as meat, poultry, and fish. Eating naturally iron-rich foods helps your body absorb the iron that is added to other foods or contained in a supplement. Iron from animal sources is better absorbed than iron from plant sources. What foods should I eat? Fruits Prunes. Raisins. Eat fruits high in vitamin C, such as oranges, grapefruits, and strawberries, with iron-rich foods. Vegetables Spinach (cooked). Green peas. Broccoli. Fermented vegetables. Eat vegetables high in vitamin C, such as leafy greens, potatoes, bell peppers, and tomatoes, with iron-rich foods. Grains Iron-fortified breakfast cereal. Iron-fortified whole-wheat bread. Enriched rice. Sprouted grains. Meats and other proteins Beef liver. Beef. [place]. Chicken. Oysters. Shrimp. Tuna. Sardines. Chickpeas. Nuts. Tofu. Pumpkin seeds. Beverages Tomato juice. Fresh orange juice. Prune juice. Hibiscus tea. Iron-fortified instant breakfast shakes. Sweets and desserts Blackstrap molasses. Seasonings and condiments Tahini. Fermented soy sauce. Other foods Wheat germ. The items listed above may not be a complete list of recommended foods and beverages. Contact a dietitian for more information. What foods should I limit? These are foods that should be limited while eating iron-rich foods as they can reduce the absorption of iron in your body. Grains Whole grains. Bran cereal. Bran flour. Meats and other proteins Soybeans. Products made from soy protein. Black beans. Lentils. Mung beans. Split peas. Dairy Milk. Cream. Cheese. Yogurt. Cottage cheese. Beverages Coffee. Black tea. Red wine. Sweets and desserts Cocoa. Chocolate. Ice cream. Seasonings and condiments Basil. Oregano. Large amounts of parsley. The items listed  above may not be a complete list of foods and beverages you should limit. Contact a dietitian for more  information. Summary Iron is a mineral that helps your body produce hemoglobin. Hemoglobin is a protein in red blood cells that carries oxygen to your body's tissues. Iron is naturally found in many foods, and many foods have iron added to them (are iron-fortified). When you eat foods that contain iron, you should eat them with foods that are high in vitamin C. Vitamin C helps your body absorb iron. Certain foods and drinks prevent your body from absorbing iron properly, such as whole grains and dairy products. You should avoid eating these foods in the same meal as iron-rich foods or with iron supplements. This information is not intended to replace advice given to you by your health care provider. Make sure you discuss any questions you have with your health care provider. Document Revised: 11/11/2020 Document Reviewed: 11/11/2020 Elsevier Patient Education  2024 Elsevier Inc. Iron Sucrose Injection What is this medication? IRON SUCROSE (EYE ern SOO krose) treats low levels of iron (iron deficiency anemia) in people with kidney disease. Iron is a mineral that plays an important role in making red blood cells, which carry oxygen from your lungs to the rest of your body. This medicine may be used for other purposes; ask your health care provider or pharmacist if you have questions. COMMON BRAND NAME(S): Venofer What should I tell my care team before I take this medication? They need to know if you have any of these conditions: Anemia not caused by low iron levels Heart disease High levels of iron in the blood Kidney disease Liver disease An unusual or allergic reaction to iron, other medications, foods, dyes, or preservatives Pregnant or trying to get pregnant Breastfeeding How should I use this medication? This medication is for infusion into a vein. It is given in a hospital or clinic setting. Talk to your care team about the use of this medication in children. While this medication may  be prescribed for children as young as 2 years for selected conditions, precautions do apply. Overdosage: If you think you have taken too much of this medicine contact a poison control center or emergency room at once. NOTE: This medicine is only for you. Do not share this medicine with others. What if I miss a dose? Keep appointments for follow-up doses. It is important not to miss your dose. Call your care team if you are unable to keep an appointment. What may interact with this medication? Do not take this medication with any of the following: Deferoxamine Dimercaprol Other iron products This medication may also interact with the following: Chloramphenicol Deferasirox This list may not describe all possible interactions. Give your health care provider a list of all the medicines, herbs, non-prescription drugs, or dietary supplements you use. Also tell them if you smoke, drink alcohol, or use illegal drugs. Some items may interact with your medicine. What should I watch for while using this medication? Visit your care team regularly. Tell your care team if your symptoms do not start to get better or if they get worse. You may need blood work done while you are taking this medication. You may need to follow a special diet. Talk to your care team. Foods that contain iron include: whole grains/cereals, dried fruits, beans, or peas, leafy green vegetables, and organ meats (liver, kidney). What side effects may I notice from receiving this medication? Side effects that you should report to your  care team as soon as possible: Allergic reactions--skin rash, itching, hives, swelling of the face, lips, tongue, or throat Low blood pressure--dizziness, feeling faint or lightheaded, blurry vision Shortness of breath Side effects that usually do not require medical attention (report to your care team if they continue or are bothersome): Flushing Headache Joint pain Muscle pain Nausea Pain, redness,  or irritation at injection site This list may not describe all possible side effects. Call your doctor for medical advice about side effects. You may report side effects to FDA at 1-800-FDA-1088. Where should I keep my medication? This medication is given in a hospital or clinic. It will not be stored at home. NOTE: This sheet is a summary. It may not cover all possible information. If you have questions about this medicine, talk to your doctor, pharmacist, or health care provider.  2024 Elsevier/Gold Standard (2023-05-07 00:00:00)

## 2023-07-21 ENCOUNTER — Other Ambulatory Visit (HOSPITAL_COMMUNITY): Payer: Self-pay

## 2023-07-21 MED FILL — Iron Sucrose Inj 20 MG/ML (Fe Equiv): INTRAVENOUS | Qty: 10 | Status: AC

## 2023-07-22 ENCOUNTER — Inpatient Hospital Stay: Payer: Commercial Managed Care - PPO

## 2023-07-22 VITALS — BP 121/76 | HR 78 | Temp 98.8°F | Resp 18 | Ht 67.0 in | Wt 228.1 lb

## 2023-07-22 DIAGNOSIS — D539 Nutritional anemia, unspecified: Secondary | ICD-10-CM

## 2023-07-22 DIAGNOSIS — D500 Iron deficiency anemia secondary to blood loss (chronic): Secondary | ICD-10-CM | POA: Diagnosis not present

## 2023-07-22 MED ORDER — SODIUM CHLORIDE 0.9 % IV SOLN
200.0000 mg | Freq: Once | INTRAVENOUS | Status: AC
  Administered 2023-07-22: 200 mg via INTRAVENOUS
  Filled 2023-07-22: qty 200

## 2023-07-22 MED ORDER — ACETAMINOPHEN 325 MG PO TABS
650.0000 mg | ORAL_TABLET | Freq: Once | ORAL | Status: AC
  Administered 2023-07-22: 650 mg via ORAL
  Filled 2023-07-22: qty 2

## 2023-07-22 MED ORDER — LORATADINE 10 MG PO TABS
10.0000 mg | ORAL_TABLET | Freq: Once | ORAL | Status: AC
  Administered 2023-07-22: 10 mg via ORAL
  Filled 2023-07-22: qty 1

## 2023-07-22 MED ORDER — SODIUM CHLORIDE 0.9 % IV SOLN: Freq: Once | INTRAVENOUS | Status: AC

## 2023-07-22 NOTE — Patient Instructions (Signed)
Iron Sucrose Injection What is this medication? IRON SUCROSE (EYE ern SOO krose) treats low levels of iron (iron deficiency anemia) in people with kidney disease. Iron is a mineral that plays an important role in making red blood cells, which carry oxygen from your lungs to the rest of your body. This medicine may be used for other purposes; ask your health care provider or pharmacist if you have questions. COMMON BRAND NAME(S): Venofer What should I tell my care team before I take this medication? They need to know if you have any of these conditions: Anemia not caused by low iron levels Heart disease High levels of iron in the blood Kidney disease Liver disease An unusual or allergic reaction to iron, other medications, foods, dyes, or preservatives Pregnant or trying to get pregnant Breastfeeding How should I use this medication? This medication is for infusion into a vein. It is given in a hospital or clinic setting. Talk to your care team about the use of this medication in children. While this medication may be prescribed for children as young as 2 years for selected conditions, precautions do apply. Overdosage: If you think you have taken too much of this medicine contact a poison control center or emergency room at once. NOTE: This medicine is only for you. Do not share this medicine with others. What if I miss a dose? Keep appointments for follow-up doses. It is important not to miss your dose. Call your care team if you are unable to keep an appointment. What may interact with this medication? Do not take this medication with any of the following: Deferoxamine Dimercaprol Other iron products This medication may also interact with the following: Chloramphenicol Deferasirox This list may not describe all possible interactions. Give your health care provider a list of all the medicines, herbs, non-prescription drugs, or dietary supplements you use. Also tell them if you smoke,  drink alcohol, or use illegal drugs. Some items may interact with your medicine. What should I watch for while using this medication? Visit your care team regularly. Tell your care team if your symptoms do not start to get better or if they get worse. You may need blood work done while you are taking this medication. You may need to follow a special diet. Talk to your care team. Foods that contain iron include: whole grains/cereals, dried fruits, beans, or peas, leafy green vegetables, and organ meats (liver, kidney). What side effects may I notice from receiving this medication? Side effects that you should report to your care team as soon as possible: Allergic reactions--skin rash, itching, hives, swelling of the face, lips, tongue, or throat Low blood pressure--dizziness, feeling faint or lightheaded, blurry vision Shortness of breath Side effects that usually do not require medical attention (report to your care team if they continue or are bothersome): Flushing Headache Joint pain Muscle pain Nausea Pain, redness, or irritation at injection site This list may not describe all possible side effects. Call your doctor for medical advice about side effects. You may report side effects to FDA at 1-800-FDA-1088. Where should I keep my medication? This medication is given in a hospital or clinic. It will not be stored at home. NOTE: This sheet is a summary. It may not cover all possible information. If you have questions about this medicine, talk to your doctor, pharmacist, or health care provider.  2024 Elsevier/Gold Standard (2023-05-07 00:00:00)

## 2023-07-23 ENCOUNTER — Other Ambulatory Visit (HOSPITAL_COMMUNITY): Payer: Self-pay

## 2023-07-23 MED ORDER — CLONAZEPAM 1 MG PO TABS
1.0000 mg | ORAL_TABLET | Freq: Two times a day (BID) | ORAL | 0 refills | Status: AC | PRN
  Filled 2023-07-23: qty 60, 30d supply, fill #0

## 2023-07-23 MED FILL — Iron Sucrose Inj 20 MG/ML (Fe Equiv): INTRAVENOUS | Qty: 10 | Status: AC

## 2023-07-24 ENCOUNTER — Other Ambulatory Visit (HOSPITAL_COMMUNITY): Payer: Self-pay

## 2023-07-26 ENCOUNTER — Other Ambulatory Visit (HOSPITAL_COMMUNITY): Payer: Self-pay

## 2023-07-26 ENCOUNTER — Inpatient Hospital Stay: Payer: Commercial Managed Care - PPO

## 2023-07-26 VITALS — BP 101/74 | HR 73 | Temp 98.1°F | Resp 18

## 2023-07-26 DIAGNOSIS — D500 Iron deficiency anemia secondary to blood loss (chronic): Secondary | ICD-10-CM | POA: Diagnosis not present

## 2023-07-26 DIAGNOSIS — D539 Nutritional anemia, unspecified: Secondary | ICD-10-CM

## 2023-07-26 MED ORDER — SODIUM CHLORIDE 0.9 % IV SOLN
200.0000 mg | Freq: Once | INTRAVENOUS | Status: AC
  Administered 2023-07-26: 200 mg via INTRAVENOUS
  Filled 2023-07-26: qty 200

## 2023-07-26 MED ORDER — LORATADINE 10 MG PO TABS
10.0000 mg | ORAL_TABLET | Freq: Once | ORAL | Status: AC
  Administered 2023-07-26: 10 mg via ORAL
  Filled 2023-07-26: qty 1

## 2023-07-26 MED ORDER — ACETAMINOPHEN 325 MG PO TABS
650.0000 mg | ORAL_TABLET | Freq: Once | ORAL | Status: AC
  Administered 2023-07-26: 650 mg via ORAL
  Filled 2023-07-26: qty 2

## 2023-07-26 MED ORDER — TEMAZEPAM 30 MG PO CAPS
30.0000 mg | ORAL_CAPSULE | Freq: Every evening | ORAL | 0 refills | Status: DC
  Filled 2023-07-26: qty 90, 90d supply, fill #0

## 2023-07-26 MED ORDER — SODIUM CHLORIDE 0.9 % IV SOLN: Freq: Once | INTRAVENOUS | Status: AC

## 2023-07-26 NOTE — Patient Instructions (Signed)
 Iron Sucrose Injection What is this medication? IRON SUCROSE (EYE ern SOO krose) treats low levels of iron (iron deficiency anemia) in people with kidney disease. Iron is a mineral that plays an important role in making red blood cells, which carry oxygen from your lungs to the rest of your body. This medicine may be used for other purposes; ask your health care provider or pharmacist if you have questions. COMMON BRAND NAME(S): Venofer What should I tell my care team before I take this medication? They need to know if you have any of these conditions: Anemia not caused by low iron levels Heart disease High levels of iron in the blood Kidney disease Liver disease An unusual or allergic reaction to iron, other medications, foods, dyes, or preservatives Pregnant or trying to get pregnant Breastfeeding How should I use this medication? This medication is for infusion into a vein. It is given in a hospital or clinic setting. Talk to your care team about the use of this medication in children. While this medication may be prescribed for children as young as 2 years for selected conditions, precautions do apply. Overdosage: If you think you have taken too much of this medicine contact a poison control center or emergency room at once. NOTE: This medicine is only for you. Do not share this medicine with others. What if I miss a dose? Keep appointments for follow-up doses. It is important not to miss your dose. Call your care team if you are unable to keep an appointment. What may interact with this medication? Do not take this medication with any of the following: Deferoxamine Dimercaprol Other iron products This medication may also interact with the following: Chloramphenicol Deferasirox This list may not describe all possible interactions. Give your health care provider a list of all the medicines, herbs, non-prescription drugs, or dietary supplements you use. Also tell them if you smoke,  drink alcohol, or use illegal drugs. Some items may interact with your medicine. What should I watch for while using this medication? Visit your care team regularly. Tell your care team if your symptoms do not start to get better or if they get worse. You may need blood work done while you are taking this medication. You may need to follow a special diet. Talk to your care team. Foods that contain iron include: whole grains/cereals, dried fruits, beans, or peas, leafy green vegetables, and organ meats (liver, kidney). What side effects may I notice from receiving this medication? Side effects that you should report to your care team as soon as possible: Allergic reactions--skin rash, itching, hives, swelling of the face, lips, tongue, or throat Low blood pressure--dizziness, feeling faint or lightheaded, blurry vision Shortness of breath Side effects that usually do not require medical attention (report to your care team if they continue or are bothersome): Flushing Headache Joint pain Muscle pain Nausea Pain, redness, or irritation at injection site This list may not describe all possible side effects. Call your doctor for medical advice about side effects. You may report side effects to FDA at 1-800-FDA-1088. Where should I keep my medication? This medication is given in a hospital or clinic. It will not be stored at home. NOTE: This sheet is a summary. It may not cover all possible information. If you have questions about this medicine, talk to your doctor, pharmacist, or health care provider.  2024 Elsevier/Gold Standard (2023-05-07 00:00:00)

## 2023-07-27 ENCOUNTER — Other Ambulatory Visit (HOSPITAL_COMMUNITY): Payer: Self-pay

## 2023-07-27 DIAGNOSIS — Z6836 Body mass index (BMI) 36.0-36.9, adult: Secondary | ICD-10-CM | POA: Diagnosis not present

## 2023-07-27 DIAGNOSIS — Z01419 Encounter for gynecological examination (general) (routine) without abnormal findings: Secondary | ICD-10-CM | POA: Diagnosis not present

## 2023-07-27 DIAGNOSIS — Z1231 Encounter for screening mammogram for malignant neoplasm of breast: Secondary | ICD-10-CM | POA: Diagnosis not present

## 2023-07-27 MED FILL — Iron Sucrose Inj 20 MG/ML (Fe Equiv): INTRAVENOUS | Qty: 10 | Status: AC

## 2023-07-28 ENCOUNTER — Encounter: Payer: Self-pay | Admitting: Oncology

## 2023-07-28 ENCOUNTER — Inpatient Hospital Stay: Payer: Commercial Managed Care - PPO

## 2023-07-28 VITALS — BP 122/83 | HR 62 | Temp 97.7°F | Resp 18

## 2023-07-28 DIAGNOSIS — D539 Nutritional anemia, unspecified: Secondary | ICD-10-CM

## 2023-07-28 DIAGNOSIS — D500 Iron deficiency anemia secondary to blood loss (chronic): Secondary | ICD-10-CM | POA: Diagnosis not present

## 2023-07-28 MED ORDER — SODIUM CHLORIDE 0.9 % IV SOLN
200.0000 mg | Freq: Once | INTRAVENOUS | Status: AC
  Administered 2023-07-28: 200 mg via INTRAVENOUS
  Filled 2023-07-28: qty 200

## 2023-07-28 MED ORDER — SODIUM CHLORIDE 0.9 % IV SOLN: Freq: Once | INTRAVENOUS | Status: AC

## 2023-07-28 MED ORDER — ACETAMINOPHEN 325 MG PO TABS
650.0000 mg | ORAL_TABLET | Freq: Once | ORAL | Status: AC
  Administered 2023-07-28: 650 mg via ORAL
  Filled 2023-07-28: qty 2

## 2023-07-28 MED ORDER — LORATADINE 10 MG PO TABS
10.0000 mg | ORAL_TABLET | Freq: Once | ORAL | Status: AC
  Administered 2023-07-28: 10 mg via ORAL
  Filled 2023-07-28: qty 1

## 2023-07-28 MED FILL — Iron Sucrose Inj 20 MG/ML (Fe Equiv): INTRAVENOUS | Qty: 10 | Status: AC

## 2023-07-28 NOTE — Patient Instructions (Signed)
 Iron Sucrose Injection What is this medication? IRON SUCROSE (EYE ern SOO krose) treats low levels of iron (iron deficiency anemia) in people with kidney disease. Iron is a mineral that plays an important role in making red blood cells, which carry oxygen from your lungs to the rest of your body. This medicine may be used for other purposes; ask your health care provider or pharmacist if you have questions. COMMON BRAND NAME(S): Venofer What should I tell my care team before I take this medication? They need to know if you have any of these conditions: Anemia not caused by low iron levels Heart disease High levels of iron in the blood Kidney disease Liver disease An unusual or allergic reaction to iron, other medications, foods, dyes, or preservatives Pregnant or trying to get pregnant Breastfeeding How should I use this medication? This medication is for infusion into a vein. It is given in a hospital or clinic setting. Talk to your care team about the use of this medication in children. While this medication may be prescribed for children as young as 2 years for selected conditions, precautions do apply. Overdosage: If you think you have taken too much of this medicine contact a poison control center or emergency room at once. NOTE: This medicine is only for you. Do not share this medicine with others. What if I miss a dose? Keep appointments for follow-up doses. It is important not to miss your dose. Call your care team if you are unable to keep an appointment. What may interact with this medication? Do not take this medication with any of the following: Deferoxamine Dimercaprol Other iron products This medication may also interact with the following: Chloramphenicol Deferasirox This list may not describe all possible interactions. Give your health care provider a list of all the medicines, herbs, non-prescription drugs, or dietary supplements you use. Also tell them if you smoke,  drink alcohol, or use illegal drugs. Some items may interact with your medicine. What should I watch for while using this medication? Visit your care team regularly. Tell your care team if your symptoms do not start to get better or if they get worse. You may need blood work done while you are taking this medication. You may need to follow a special diet. Talk to your care team. Foods that contain iron include: whole grains/cereals, dried fruits, beans, or peas, leafy green vegetables, and organ meats (liver, kidney). What side effects may I notice from receiving this medication? Side effects that you should report to your care team as soon as possible: Allergic reactions--skin rash, itching, hives, swelling of the face, lips, tongue, or throat Low blood pressure--dizziness, feeling faint or lightheaded, blurry vision Shortness of breath Side effects that usually do not require medical attention (report to your care team if they continue or are bothersome): Flushing Headache Joint pain Muscle pain Nausea Pain, redness, or irritation at injection site This list may not describe all possible side effects. Call your doctor for medical advice about side effects. You may report side effects to FDA at 1-800-FDA-1088. Where should I keep my medication? This medication is given in a hospital or clinic. It will not be stored at home. NOTE: This sheet is a summary. It may not cover all possible information. If you have questions about this medicine, talk to your doctor, pharmacist, or health care provider.  2024 Elsevier/Gold Standard (2023-05-07 00:00:00)

## 2023-07-29 ENCOUNTER — Inpatient Hospital Stay: Payer: Commercial Managed Care - PPO

## 2023-07-29 VITALS — BP 134/86 | HR 83 | Temp 98.0°F | Resp 18

## 2023-07-29 DIAGNOSIS — D500 Iron deficiency anemia secondary to blood loss (chronic): Secondary | ICD-10-CM | POA: Diagnosis not present

## 2023-07-29 DIAGNOSIS — D539 Nutritional anemia, unspecified: Secondary | ICD-10-CM

## 2023-07-29 MED ORDER — SODIUM CHLORIDE 0.9 % IV SOLN: Freq: Once | INTRAVENOUS | Status: AC

## 2023-07-29 MED ORDER — SODIUM CHLORIDE 0.9 % IV SOLN
200.0000 mg | Freq: Once | INTRAVENOUS | Status: AC
  Administered 2023-07-29: 200 mg via INTRAVENOUS
  Filled 2023-07-29: qty 200

## 2023-07-29 MED ORDER — ACETAMINOPHEN 325 MG PO TABS
650.0000 mg | ORAL_TABLET | Freq: Once | ORAL | Status: AC
  Administered 2023-07-29: 650 mg via ORAL
  Filled 2023-07-29: qty 2

## 2023-07-29 MED ORDER — LORATADINE 10 MG PO TABS
10.0000 mg | ORAL_TABLET | Freq: Once | ORAL | Status: AC
  Administered 2023-07-29: 10 mg via ORAL
  Filled 2023-07-29: qty 1

## 2023-07-29 NOTE — Patient Instructions (Signed)
 Iron Sucrose Injection What is this medication? IRON SUCROSE (EYE ern SOO krose) treats low levels of iron (iron deficiency anemia) in people with kidney disease. Iron is a mineral that plays an important role in making red blood cells, which carry oxygen from your lungs to the rest of your body. This medicine may be used for other purposes; ask your health care provider or pharmacist if you have questions. COMMON BRAND NAME(S): Venofer What should I tell my care team before I take this medication? They need to know if you have any of these conditions: Anemia not caused by low iron levels Heart disease High levels of iron in the blood Kidney disease Liver disease An unusual or allergic reaction to iron, other medications, foods, dyes, or preservatives Pregnant or trying to get pregnant Breastfeeding How should I use this medication? This medication is for infusion into a vein. It is given in a hospital or clinic setting. Talk to your care team about the use of this medication in children. While this medication may be prescribed for children as young as 2 years for selected conditions, precautions do apply. Overdosage: If you think you have taken too much of this medicine contact a poison control center or emergency room at once. NOTE: This medicine is only for you. Do not share this medicine with others. What if I miss a dose? Keep appointments for follow-up doses. It is important not to miss your dose. Call your care team if you are unable to keep an appointment. What may interact with this medication? Do not take this medication with any of the following: Deferoxamine Dimercaprol Other iron products This medication may also interact with the following: Chloramphenicol Deferasirox This list may not describe all possible interactions. Give your health care provider a list of all the medicines, herbs, non-prescription drugs, or dietary supplements you use. Also tell them if you smoke,  drink alcohol, or use illegal drugs. Some items may interact with your medicine. What should I watch for while using this medication? Visit your care team regularly. Tell your care team if your symptoms do not start to get better or if they get worse. You may need blood work done while you are taking this medication. You may need to follow a special diet. Talk to your care team. Foods that contain iron include: whole grains/cereals, dried fruits, beans, or peas, leafy green vegetables, and organ meats (liver, kidney). What side effects may I notice from receiving this medication? Side effects that you should report to your care team as soon as possible: Allergic reactions--skin rash, itching, hives, swelling of the face, lips, tongue, or throat Low blood pressure--dizziness, feeling faint or lightheaded, blurry vision Shortness of breath Side effects that usually do not require medical attention (report to your care team if they continue or are bothersome): Flushing Headache Joint pain Muscle pain Nausea Pain, redness, or irritation at injection site This list may not describe all possible side effects. Call your doctor for medical advice about side effects. You may report side effects to FDA at 1-800-FDA-1088. Where should I keep my medication? This medication is given in a hospital or clinic. It will not be stored at home. NOTE: This sheet is a summary. It may not cover all possible information. If you have questions about this medicine, talk to your doctor, pharmacist, or health care provider.  2024 Elsevier/Gold Standard (2023-05-07 00:00:00)

## 2023-07-30 ENCOUNTER — Ambulatory Visit: Payer: Commercial Managed Care - PPO

## 2023-08-02 ENCOUNTER — Ambulatory Visit: Payer: Commercial Managed Care - PPO | Admitting: Oncology

## 2023-08-02 ENCOUNTER — Other Ambulatory Visit: Payer: Commercial Managed Care - PPO

## 2023-08-04 ENCOUNTER — Inpatient Hospital Stay (INDEPENDENT_AMBULATORY_CARE_PROVIDER_SITE_OTHER): Payer: Commercial Managed Care - PPO | Admitting: Oncology

## 2023-08-04 ENCOUNTER — Inpatient Hospital Stay: Payer: Commercial Managed Care - PPO

## 2023-08-04 ENCOUNTER — Telehealth: Payer: Self-pay | Admitting: Oncology

## 2023-08-04 VITALS — BP 143/91 | HR 88 | Temp 98.4°F | Resp 16 | Ht 67.0 in | Wt 235.8 lb

## 2023-08-04 DIAGNOSIS — D539 Nutritional anemia, unspecified: Secondary | ICD-10-CM | POA: Diagnosis not present

## 2023-08-04 DIAGNOSIS — D126 Benign neoplasm of colon, unspecified: Secondary | ICD-10-CM

## 2023-08-04 DIAGNOSIS — D500 Iron deficiency anemia secondary to blood loss (chronic): Secondary | ICD-10-CM | POA: Diagnosis not present

## 2023-08-04 LAB — CBC WITH DIFFERENTIAL/PLATELET
Abs Immature Granulocytes: 0.03 10*3/uL (ref 0.00–0.07)
Basophils Absolute: 0 10*3/uL (ref 0.0–0.1)
Basophils Relative: 1 %
Eosinophils Absolute: 0.1 10*3/uL (ref 0.0–0.5)
Eosinophils Relative: 2 %
HCT: 35.9 % — ABNORMAL LOW (ref 36.0–46.0)
Hemoglobin: 10 g/dL — ABNORMAL LOW (ref 12.0–15.0)
Immature Granulocytes: 1 %
Lymphocytes Relative: 24 %
Lymphs Abs: 1.4 10*3/uL (ref 0.7–4.0)
MCH: 20.3 pg — ABNORMAL LOW (ref 26.0–34.0)
MCHC: 27.9 g/dL — ABNORMAL LOW (ref 30.0–36.0)
MCV: 73 fL — ABNORMAL LOW (ref 80.0–100.0)
Monocytes Absolute: 0.4 10*3/uL (ref 0.1–1.0)
Monocytes Relative: 7 %
Neutro Abs: 3.8 10*3/uL (ref 1.7–7.7)
Neutrophils Relative %: 65 %
Platelets: 289 10*3/uL (ref 150–400)
RBC: 4.92 MIL/uL (ref 3.87–5.11)
RDW: 30.5 % — ABNORMAL HIGH (ref 11.5–15.5)
WBC: 5.8 10*3/uL (ref 4.0–10.5)
nRBC: 0 % (ref 0.0–0.2)

## 2023-08-04 LAB — FERRITIN: Ferritin: 121 ng/mL (ref 11–307)

## 2023-08-04 LAB — VITAMIN B12: Vitamin B-12: 300 pg/mL (ref 180–914)

## 2023-08-04 NOTE — Progress Notes (Signed)
[facility] Cancer Follow up Visit:  Patient Care Team: Noberto Retort, MD as PCP - General (Family Medicine)  CHIEF COMPLAINTS/PURPOSE OF CONSULTATION:  HISTORY OF PRESENTING ILLNESS: Theresa Henry 57 y.o. female is here because of  anemia Medial history notable for [facility] January 21 2018:  Colonoscopy done for family history of colorectal cancer in multiple second-degree relatives with two 5-8 mm polyps in the rectum and descending colon and small nonbleeding angiodysplasia in the ascending colon. Repeat recommended in 5 years. Pathology showed tubular adenomas.   June 16 2023:  GI consult--Scheduled patient for diagnostic EGD and colonoscopy...given history of adenomatous polyps, family history of colon cancer and iron deficiency anemia.    WBC 9.6 hemoglobin 9.2 MCV 62 platelet count 293; 63 segs 27 lymphs 8 monos 2 eos CMP notable for glucose of 164  July 05 2023:  [facility] Hematology Consult  No history of hemorrhage requiring transfusion.  Has tried taking iron sulfate 325 mg daily which she can not tolerate.  Has not received IV iron/ required PRBC's in the past.  Does have a normal diet.  No history of hemorrhage postoperatively requiring transfusion.  No melena, hemoptysis, hematuria.  Has occasional hemorrhoids and notes occasional hematochezia.  No change in stool caliber, constipation.  No history of intra-articular or soft tissue bleeding.  Does not regularly use NSAIDS for pain.  Is not taking oral anticoagulants  Is not taking antiplatelet drugs.  No history of abnormal bleeding in family members.  Patient has symptoms of fatigue, pallor,  DOE, palpitations, decreased performance status.  Patient has pica to ice but not to starch/dirt.    Social:  Works for [employer].  No tobacco or EtOH  [facility] Mother died 36 ovarian cancer Father alive 26 Alzheimer's Brother died 5 MI Paternal Uncle died with history of colon cancer  Retic count 2.0%.  Direct  Coombs test negative Vitamin B12 265  July 20, 2023 through July 29, 2023: Received total of 1000 mg of Venofer  August 04, 2023: Scheduled follow-up for anemia.  Has more energy; even husband notices.  Still gets SOB with some activities.  Ice pica improving but not resolved Hgb 10.0  MCV 73  Ferritin 121   August 18 2023:  Pan endoscopy   Review of Systems - Oncology  MEDICAL HISTORY: Past Medical History:  Diagnosis Date   Allergy    Anemia    Anxiety    GERD (gastroesophageal reflux disease)    Hypertension    Migraine    Obesity     SURGICAL HISTORY: Past Surgical History:  Procedure Laterality Date   ABDOMINAL HYSTERECTOMY     per pt-had vaginal hysterectomy!   BREAST REDUCTION SURGERY     CARPAL TUNNEL RELEASE     right hand done 01/2017/left CTR 12/202018   CERVICAL DISC SURGERY  02/05/2017   c4-c56   CERVICAL DISCECTOMY     LAPAROSCOPY     LUMBAR MICRODISCECTOMY     REDUCTION MAMMAPLASTY Bilateral 2001   TYMPANOSTOMY TUBE PLACEMENT     WISDOM TOOTH EXTRACTION      SOCIAL HISTORY: Social History   Socioeconomic History   Marital status: Married    Spouse name: Brett Canales   Number of children: 1   Years of education: 12+4   Highest education level: Bachelor's degree (e.g., BA, AB, BS)  Occupational History   Not on file  Tobacco Use   Smoking status: Never   Smokeless tobacco: Never  Vaping  Use   Vaping status: Never Used  Substance and Sexual Activity   Alcohol use: Never   Drug use: Never   Sexual activity: Not Currently  Other Topics Concern   Not on file  Social History Narrative   Not on file   Social Determinants of Health   Financial Resource Strain: Not on file  Food Insecurity: No Food Insecurity (07/05/2023)   Hunger Vital Sign    Worried About Running Out of Food in the Last Year: Never true    Ran Out of Food in the Last Year: Never true  Transportation Needs: No Transportation Needs (07/05/2023)   PRAPARE - Therapist, art (Medical): No    Lack of Transportation (Non-Medical): No  Physical Activity: Not on file  Stress: Not on file  Social Connections: Not on file  Intimate Partner Violence: Not At Risk (07/05/2023)   Humiliation, Afraid, Rape, and Kick questionnaire    Fear of Current or Ex-Partner: No    Emotionally Abused: No    Physically Abused: No    Sexually Abused: No    FAMILY HISTORY Family History  Problem Relation Age of Onset   Ovarian cancer Mother    High Cholesterol Father    Heart disease Father    Migraines Brother    Migraines Paternal Grandfather    Migraines Daughter    Breast cancer Maternal Aunt        in 19's or 10's   Liver disease Neg Hx    Colon cancer Neg Hx    Esophageal cancer Neg Hx     ALLERGIES:  is allergic to nitrous oxide, diphenhydramine, sulfa antibiotics, sulfamethoxazole-trimethoprim, amoxicillin, amoxicillin-pot clavulanate, band-aid friction block [foot care products], erythromycin base, and penicillins.  MEDICATIONS:  Current Outpatient Medications  Medication Sig Dispense Refill   amLODipine (NORVASC) 10 MG tablet Take 1 tablet (10 mg total) by mouth daily. 90 tablet 0   aspirin-acetaminophen-caffeine (EXCEDRIN MIGRAINE) 250-250-65 MG tablet 2 tablets Orally Once a day     baclofen (LIORESAL) 10 MG tablet Take 1 tablet (10 mg total) by mouth 3 (three) times daily with food or milk. 90 tablet 2   buPROPion (WELLBUTRIN XL) 300 MG 24 hr tablet Take 1 tablet (300 mg total) by mouth daily. 30 tablet 4   butalbital-acetaminophen-caffeine (FIORICET) 50-325-40 MG tablet Take 1 tablet by mouth every 6 (six) hours as needed. 60 tablet 0   clonazePAM (KLONOPIN) 1 MG tablet Take 1 tablet (1 mg) by mouth 2 times daily as needed for anxiety 60 tablet 0   cloNIDine (CATAPRES) 0.2 MG tablet Take 1 tablet (0.2 mg total) by mouth 2 (two) times daily. 180 tablet 0   clotrimazole-betamethasone (LOTRISONE) cream Apply to affected areas 2 times a  day for 14 days 15 g 1   escitalopram (LEXAPRO) 20 MG tablet Take 1 tablet (20 mg total) by mouth daily. 30 tablet 0   esomeprazole (NEXIUM) 40 MG capsule Take 1 capsule (40 mg total) by mouth daily. 90 capsule 0   estradiol (VIVELLE-DOT) 0.075 MG/24HR Apply 1 patch twice weekly as directed 24 patch 90   irbesartan (AVAPRO) 300 MG tablet Take 1 tablet (300 mg total) by mouth daily. 90 tablet 0   metFORMIN (GLUCOPHAGE-XR) 500 MG 24 hr tablet Take 1 tablet (500 mg total) by mouth every evening with a meal. 30 tablet 5   MINIVELLE 0.075 MG/24HR Place 1 patch onto the skin 2 (two) times a week. (Patient not taking: Reported  on 07/20/2023)  12   promethazine (PHENERGAN) 25 MG tablet Take 1 tablet by mouth every 12 hours as needed 60 tablet 3   temazepam (RESTORIL) 30 MG capsule Take 1 capsule (30 mg total) by mouth at bedtime as needed. 90 capsule 1   temazepam (RESTORIL) 30 MG capsule Take 1 capsule (30 mg) by mouth at bedtime as needed 90 capsule 0   Current Facility-Administered Medications  Medication Dose Route Frequency Provider Last Rate Last Admin   0.9 %  sodium chloride infusion  500 mL Intravenous Once Meryl Dare, MD        PHYSICAL EXAMINATION:  ECOG PERFORMANCE STATUS: 1 - Symptomatic but completely ambulatory   There were no vitals filed for this visit.   There were no vitals filed for this visit.    Physical Exam Vitals and nursing note reviewed.  Constitutional:      General: She is not in acute distress.    Appearance: Normal appearance. She is obese. She is not ill-appearing, toxic-appearing or diaphoretic.  HENT:     Head: Normocephalic and atraumatic.     Right Ear: External ear normal.     Left Ear: External ear normal.     Nose: Nose normal. No congestion or rhinorrhea.  Eyes:     General: No scleral icterus.    Extraocular Movements: Extraocular movements intact.     Conjunctiva/sclera: Conjunctivae normal.     Pupils: Pupils are equal, round, and  reactive to light.  Cardiovascular:     Rate and Rhythm: Normal rate and regular rhythm.     Heart sounds: Normal heart sounds. No murmur heard.    No friction rub. No gallop.  Pulmonary:     Effort: Pulmonary effort is normal. No respiratory distress.     Breath sounds: Normal breath sounds. No wheezing or rhonchi.  Abdominal:     General: Bowel sounds are normal.     Palpations: Abdomen is soft.     Tenderness: There is no abdominal tenderness. There is no guarding or rebound.  Musculoskeletal:        General: No swelling, tenderness or deformity.     Cervical back: Normal range of motion and neck supple. No rigidity or tenderness.  Lymphadenopathy:     Head:     Right side of head: No submental, submandibular, tonsillar, preauricular, posterior auricular or occipital adenopathy.     Left side of head: No submental, submandibular, tonsillar, preauricular, posterior auricular or occipital adenopathy.     Cervical: No cervical adenopathy.     Right cervical: No superficial, deep or posterior cervical adenopathy.    Left cervical: No superficial, deep or posterior cervical adenopathy.     Upper Body:     Right upper body: No supraclavicular, axillary, pectoral or epitrochlear adenopathy.     Left upper body: No supraclavicular, axillary, pectoral or epitrochlear adenopathy.  Skin:    General: Skin is warm.     Coloration: Skin is pale. Skin is not jaundiced.     Findings: No bruising.  Neurological:     General: No focal deficit present.     Mental Status: She is alert and oriented to person, place, and time.     Cranial Nerves: No cranial nerve deficit.     Gait: Gait normal.  Psychiatric:        Mood and Affect: Mood normal.        Behavior: Behavior normal.        Thought Content: Thought  content normal.        Judgment: Judgment normal.     LABORATORY DATA: I have personally reviewed the data as listed:  Appointment on 07/05/2023  Component Date Value Ref Range Status    DAT, complement 07/05/2023 NEG   Final   DAT, IgG 07/05/2023    Final                   Value:NEG Performed at [facility], [address] [place], [place] [address]    Haptoglobin 07/05/2023 213  33 - 346 mg/dL Final   Comment: (NOTE) Performed At: [facility] [address] [place], [place] [id number] Jolene Schimke MD ZH:[id number]    Vitamin B-12 07/05/2023 265  180 - 914 pg/mL Final   Comment: (NOTE) This assay is not validated for testing neonatal or myeloproliferative syndrome specimens for Vitamin B12 levels. Performed at [facility], [address] [place], [place] [address]    Retic Ct Pct 07/05/2023 2.0  0.4 - 3.1 % Final   RBC. 07/05/2023 4.54  3.87 - 5.11 MIL/uL Final   Retic Count, Absolute 07/05/2023 91.7  19.0 - 186.0 K/uL Final   Immature Retic Fract 07/05/2023 37.1 (H)  2.3 - 15.9 % Final   Performed at [facility], [address] [place], [place] [address]    RADIOGRAPHIC STUDIES: I have personally reviewed the radiological images as listed and agree with the findings in the report  No results found.  ASSESSMENT/PLAN   Patient is a 57 year old  female with symptomatic microcytic anemia presumed to be secondary to chronic blood loss.    Anemia:  Secondary to iron deficiency anemia from chronic GI blood loss.   July 20, 2023 through July 29, 2023: Received total of 1000 mg of Venofer  August 04 2003- Symptoms of fatigue and ice pica improved but not resolved.  Hgb 10.0  MCV 73  Ferritin 121   Will arrange for an additional course of Venofer   August 18 2023:  Pan endoscopy  Personal history of colon polyps and family history of ovarian cancer  July 05 2023- Refer to genetics for consideration of testing    Cancer Staging  No matching staging information was found for the patient.    No problem-specific Assessment & Plan notes found for this encounter.    No orders of  the defined types were placed in this encounter.   30  minutes was spent in patient care.  This included time spent preparing to see the patient (e.g., review of tests), obtaining and/or reviewing separately obtained history, counseling and educating the patient/family/caregiver, ordering medications, tests, or procedures; documenting clinical information in the electronic or other health record, independently interpreting results and communicating results to the patient/family/caregiver as well as coordination of care.       All questions were answered. The patient knows to call the clinic with any problems, questions or concerns.  This note was electronically signed.    Loni Muse, MD  08/04/2023 8:13 AM

## 2023-08-04 NOTE — Telephone Encounter (Signed)
08/04/23 Spoke with patient and confirmed next appt

## 2023-08-05 ENCOUNTER — Telehealth: Payer: Self-pay | Admitting: Oncology

## 2023-08-05 NOTE — Telephone Encounter (Signed)
Pt wishes to call back to schedule IV Iron.   Scheduling Message Entered by Domenic Schwab on 08/04/2023 at  5:02 PM Priority: Routine <No visit type provided>  Department: [facility]  Provider:  Appointment Notes:  Please schedule 5 appts for venofer.

## 2023-08-05 NOTE — Telephone Encounter (Signed)
08/05/23 Spoke with patient and scheduled Venofer.

## 2023-08-06 ENCOUNTER — Encounter: Payer: Self-pay | Admitting: Gastroenterology

## 2023-08-10 ENCOUNTER — Other Ambulatory Visit (HOSPITAL_COMMUNITY): Payer: Self-pay

## 2023-08-10 DIAGNOSIS — E119 Type 2 diabetes mellitus without complications: Secondary | ICD-10-CM | POA: Diagnosis not present

## 2023-08-10 DIAGNOSIS — E7800 Pure hypercholesterolemia, unspecified: Secondary | ICD-10-CM | POA: Diagnosis not present

## 2023-08-10 DIAGNOSIS — G43009 Migraine without aura, not intractable, without status migrainosus: Secondary | ICD-10-CM | POA: Diagnosis not present

## 2023-08-10 DIAGNOSIS — F419 Anxiety disorder, unspecified: Secondary | ICD-10-CM | POA: Diagnosis not present

## 2023-08-10 DIAGNOSIS — D538 Other specified nutritional anemias: Secondary | ICD-10-CM | POA: Diagnosis not present

## 2023-08-10 DIAGNOSIS — I10 Essential (primary) hypertension: Secondary | ICD-10-CM | POA: Diagnosis not present

## 2023-08-10 DIAGNOSIS — F331 Major depressive disorder, recurrent, moderate: Secondary | ICD-10-CM | POA: Diagnosis not present

## 2023-08-10 DIAGNOSIS — K219 Gastro-esophageal reflux disease without esophagitis: Secondary | ICD-10-CM | POA: Diagnosis not present

## 2023-08-10 MED ORDER — ESCITALOPRAM OXALATE 20 MG PO TABS
20.0000 mg | ORAL_TABLET | Freq: Every day | ORAL | 3 refills | Status: DC
  Filled 2023-08-10: qty 90, 90d supply, fill #0
  Filled 2023-11-15: qty 90, 90d supply, fill #1
  Filled 2024-02-16 (×2): qty 90, 90d supply, fill #2
  Filled 2024-05-09: qty 90, 90d supply, fill #3

## 2023-08-17 ENCOUNTER — Other Ambulatory Visit (HOSPITAL_COMMUNITY): Payer: Self-pay

## 2023-08-18 ENCOUNTER — Ambulatory Visit (AMBULATORY_SURGERY_CENTER): Payer: Commercial Managed Care - PPO | Admitting: Gastroenterology

## 2023-08-18 ENCOUNTER — Encounter: Payer: Self-pay | Admitting: Gastroenterology

## 2023-08-18 VITALS — BP 124/67 | HR 97 | Temp 97.8°F | Resp 13 | Ht 67.0 in | Wt 235.0 lb

## 2023-08-18 DIAGNOSIS — D124 Benign neoplasm of descending colon: Secondary | ICD-10-CM | POA: Diagnosis not present

## 2023-08-18 DIAGNOSIS — K219 Gastro-esophageal reflux disease without esophagitis: Secondary | ICD-10-CM

## 2023-08-18 DIAGNOSIS — Z800 Family history of malignant neoplasm of digestive organs: Secondary | ICD-10-CM

## 2023-08-18 DIAGNOSIS — K317 Polyp of stomach and duodenum: Secondary | ICD-10-CM | POA: Diagnosis not present

## 2023-08-18 DIAGNOSIS — K5520 Angiodysplasia of colon without hemorrhage: Secondary | ICD-10-CM | POA: Diagnosis not present

## 2023-08-18 DIAGNOSIS — D123 Benign neoplasm of transverse colon: Secondary | ICD-10-CM

## 2023-08-18 DIAGNOSIS — D125 Benign neoplasm of sigmoid colon: Secondary | ICD-10-CM | POA: Diagnosis not present

## 2023-08-18 DIAGNOSIS — D509 Iron deficiency anemia, unspecified: Secondary | ICD-10-CM | POA: Diagnosis not present

## 2023-08-18 DIAGNOSIS — K922 Gastrointestinal hemorrhage, unspecified: Secondary | ICD-10-CM | POA: Diagnosis not present

## 2023-08-18 DIAGNOSIS — Z09 Encounter for follow-up examination after completed treatment for conditions other than malignant neoplasm: Secondary | ICD-10-CM

## 2023-08-18 DIAGNOSIS — K635 Polyp of colon: Secondary | ICD-10-CM | POA: Diagnosis not present

## 2023-08-18 DIAGNOSIS — Z86010 Personal history of colonic polyps: Secondary | ICD-10-CM

## 2023-08-18 MED ORDER — SODIUM CHLORIDE 0.9 % IV SOLN: 500.0000 mL | Freq: Once | INTRAVENOUS | Status: DC

## 2023-08-18 NOTE — Progress Notes (Signed)
See 08/04/2023 H&P, no changes

## 2023-08-18 NOTE — Progress Notes (Signed)
Called to room to assist during endoscopic procedure.  Patient ID and intended procedure confirmed with present staff. Received instructions for my participation in the procedure from the performing physician.  

## 2023-08-18 NOTE — Progress Notes (Signed)
Vss nad trans to pacu 

## 2023-08-18 NOTE — Patient Instructions (Addendum)
-   Resume previous diet. - Continue present medications. - Await pathology results.  YOU HAD AN ENDOSCOPIC PROCEDURE TODAY AT THE [facility]:   Refer to the procedure report that was given to you for any specific questions about what was found during the examination.  If the procedure report does not answer your questions, please call your gastroenterologist to clarify.  If you requested that your care partner not be given the details of your procedure findings, then the procedure report has been included in a sealed envelope for you to review at your convenience later.  YOU SHOULD EXPECT: Some feelings of bloating in the abdomen. Passage of more gas than usual.  Walking can help get rid of the air that was put into your GI tract during the procedure and reduce the bloating. If you had a lower endoscopy (such as a colonoscopy or flexible sigmoidoscopy) you may notice spotting of blood in your stool or on the toilet paper. If you underwent a bowel prep for your procedure, you may not have a normal bowel movement for a few days.  Please Note:  You might notice some irritation and congestion in your nose or some drainage.  This is from the oxygen used during your procedure.  There is no need for concern and it should clear up in a day or so.  SYMPTOMS TO REPORT IMMEDIATELY:  Following lower endoscopy (colonoscopy or flexible sigmoidoscopy):  Excessive amounts of blood in the stool  Significant tenderness or worsening of abdominal pains  Swelling of the abdomen that is new, acute  Fever of 100F or higher  Following upper endoscopy (EGD)  Vomiting of blood or coffee ground material  New chest pain or pain under the shoulder blades  Painful or persistently difficult swallowing  New shortness of breath  Fever of 100F or higher  Black, tarry-looking stools  For urgent or emergent issues, a gastroenterologist can be reached at any hour by calling [phone]. Do not use MyChart  messaging for urgent concerns.    DIET:  We do recommend a small meal at first, but then you may proceed to your regular diet.  Drink plenty of fluids but you should avoid alcoholic beverages for 24 hours.  ACTIVITY:  You should plan to take it easy for the rest of today and you should NOT DRIVE or use heavy machinery until tomorrow (because of the sedation medicines used during the test).    FOLLOW UP: Our staff will call the number listed on your records the next business day following your procedure.  We will call around 7:15- 8:00 am to check on you and address any questions or concerns that you may have regarding the information given to you following your procedure. If we do not reach you, we will leave a message.     If any biopsies were taken you will be contacted by phone or by letter within the next 1-3 weeks.  Please call us at [phone] if you have not heard about the biopsies in 3 weeks.    SIGNATURES/CONFIDENTIALITY: You and/or your care partner have signed paperwork which will be entered into your electronic medical record.  These signatures attest to the fact that that the information above on your After Visit Summary has been reviewed and is understood.  Full responsibility of the confidentiality of this discharge information lies with you and/or your care-partner.

## 2023-08-18 NOTE — Op Note (Signed)
[facility] Patient Name: Theresa Henry Procedure Date: 08/18/2023 10:34 AM MRN: [id number] Endoscopist: Meryl Dare , MD, [phone] Age: 57 Referring MD:  Date of Birth: Jun 21, 1966 Gender: Female Account #: [id number] Procedure:                Colonoscopy Indications:              Iron deficiency anemia, Personal history of                            adenomatous colon polyps, Family history of colon                            cancer, multiple 2nd-degree relatives Medicines:                Monitored Anesthesia Care Procedure:                Pre-Anesthesia Assessment:                           - Prior to the procedure, a History and Physical                            was performed, and patient medications and                            allergies were reviewed. The patient's tolerance of                            previous anesthesia was also reviewed. The risks                            and benefits of the procedure and the sedation                            options and risks were discussed with the patient.                            All questions were answered, and informed consent                            was obtained. Prior Anticoagulants: The patient has                            taken no anticoagulant or antiplatelet agents. ASA                            Grade Assessment: II - A patient with mild systemic                            disease. After reviewing the risks and benefits,                            the patient was deemed in satisfactory condition to  undergo the procedure.                           After obtaining informed consent, the colonoscope                            was passed under direct vision. Throughout the                            procedure, the patient's blood pressure, pulse, and                            oxygen saturations were monitored continuously. The                            CF [id number] #[id number] was  introduced through the anus                            and advanced to the the cecum, identified by                            appendiceal orifice and ileocecal valve. The                            ileocecal valve, appendiceal orifice, and rectum                            were photographed. The quality of the bowel                            preparation was good. The colonoscopy was performed                            without difficulty. The patient tolerated the                            procedure well. Scope In: 10:42:18 AM Scope Out: 10:59:13 AM Scope Withdrawal Time: 0 hours 14 minutes 15 seconds  Total Procedure Duration: 0 hours 16 minutes 55 seconds  Findings:                 The perianal and digital rectal examinations were                            normal.                           A single medium-sized localized angioectasia                            without bleeding was found in the ascending colon.                           Three sessile polyps were found in the sigmoid (1)  and transverse colon (2). The polyps were 5 to 7 mm                            in size. These polyps were removed with a cold                            snare. Resection and retrieval were complete.                           A 3 mm polyp was found in the descending colon. The                            polyp was sessile. The polyp was removed with a                            cold biopsy forceps. Resection and retrieval were                            complete.                           A patchy area of mildly erythematous mucosa was                            found in the sigmoid colon and in the descending                            colon. Biopsies were taken with a cold forceps for                            histology.                           The exam was otherwise without abnormality on                            direct and retroflexion views. Complications:             No immediate complications. Estimated blood loss:                            None. Estimated Blood Loss:     Estimated blood loss: none. Impression:               - A single non-bleeding ascending colon                            angioectasia.                           - Three 5 to 7 mm polyps in the sigmoid and in the                            transverse colon, removed with a cold snare.  Resected and retrieved.                           - One 3 mm polyp in the descending colon, removed                            with a cold biopsy forceps. Resected and retrieved.                           - Erythematous mucosa in the sigmoid colon and in                            the descending colon. Biopsied.                           - The examination was otherwise normal on direct                            and retroflexion views. Recommendation:           - Repeat colonoscopy after studies are complete for                            surveillance based on pathology results.                           - Patient has a contact number available for                            emergencies. The signs and symptoms of potential                            delayed complications were discussed with the                            patient. Return to normal activities tomorrow.                            Written discharge instructions were provided to the                            patient.                           - Resume previous diet.                           - Continue present medications.                           - Await pathology results. Meryl Dare, MD 08/18/2023 11:18:16 AM This report has been signed electronically.

## 2023-08-18 NOTE — Op Note (Signed)
[facility] Patient Name: Laurine Mcfarlan Procedure Date: 08/18/2023 10:33 AM MRN: [id number] Endoscopist: Meryl Dare , MD, [phone] Age: 57 Referring MD:  Date of Birth: 1966-02-03 Gender: Female Account #: [id number] Procedure:                Upper GI endoscopy Indications:              Iron deficiency anemia, Gastroesophageal reflux                            disease Medicines:                Monitored Anesthesia Care Procedure:                Pre-Anesthesia Assessment:                           - Prior to the procedure, a History and Physical                            was performed, and patient medications and                            allergies were reviewed. The patient's tolerance of                            previous anesthesia was also reviewed. The risks                            and benefits of the procedure and the sedation                            options and risks were discussed with the patient.                            All questions were answered, and informed consent                            was obtained. Prior Anticoagulants: The patient has                            taken no anticoagulant or antiplatelet agents. ASA                            Grade Assessment: II - A patient with mild systemic                            disease. After reviewing the risks and benefits,                            the patient was deemed in satisfactory condition to                            undergo the procedure.  After obtaining informed consent, the endoscope was                            passed under direct vision. Throughout the                            procedure, the patient's blood pressure, pulse, and                            oxygen saturations were monitored continuously. The                            Olympus Scope [id number] was introduced through the                            mouth, and advanced to the second part of  duodenum.                            The upper GI endoscopy was accomplished without                            difficulty. The patient tolerated the procedure                            well. Scope In: Scope Out: Findings:                 The examined esophagus was normal.                           Multiple 3 to 8 mm sessile polyps with no bleeding                            and stigmata of recent bleeding were found in the                            gastric fundus and in the gastric body. Eight                            polyps with stigmata of recent bleeding and several                            larger polyps were removed with a cold snare.                            Resection and retrieval were complete.                           The exam of the stomach was otherwise normal.                           The duodenal bulb and second portion of the  duodenum were normal. Biopsies for histology were                            taken with a cold forceps for evaluation of celiac                            disease. Complications:            No immediate complications. Estimated Blood Loss:     Estimated blood loss was minimal. Impression:               - Normal esophagus.                           - Multiple gastric polyps. A few with stigmata of                            recent bleeding. Eight resected and retrieved.                           - Normal duodenal bulb and second portion of the                            duodenum. Biopsied. Recommendation:           - Patient has a contact number available for                            emergencies. The signs and symptoms of potential                            delayed complications were discussed with the                            patient. Return to normal activities tomorrow.                            Written discharge instructions were provided to the                            patient.                            - Resume previous diet.                           - Continue present medications.                           - No aspirin, ibuprofen, naproxen, or other                            non-steroidal anti-inflammatory drugs for 2 weeks                            after polyp removal.                           -  Consider VCE, EGD with additional gastric                            polypectomies and colonoscopy with AVM APC ablation                            if iron deficiency is persistent. Meryl Dare, MD 08/18/2023 11:25:49 AM This report has been signed electronically.

## 2023-08-19 ENCOUNTER — Other Ambulatory Visit (HOSPITAL_COMMUNITY): Payer: Self-pay

## 2023-08-19 ENCOUNTER — Telehealth: Payer: Self-pay | Admitting: *Deleted

## 2023-08-19 NOTE — Telephone Encounter (Signed)
  Follow up Call-     08/18/2023    9:41 AM  Call back number  Post procedure Call Back phone  # [phone]  Permission to leave phone message Yes     Patient questions:  Do you have a fever, pain , or abdominal swelling? No. Pain Score  0 *  Have you tolerated food without any problems? Yes.    Have you been able to return to your normal activities? Yes.    Do you have any questions about your discharge instructions: Diet   No. Medications  No. Follow up visit  No.  Do you have questions or concerns about your Care? No.  Actions: * If pain score is 4 or above: No action needed, pain <4.

## 2023-08-20 ENCOUNTER — Other Ambulatory Visit (HOSPITAL_COMMUNITY): Payer: Self-pay

## 2023-08-20 MED ORDER — BUTALBITAL-APAP-CAFFEINE 50-325-40 MG PO TABS
1.0000 | ORAL_TABLET | Freq: Four times a day (QID) | ORAL | 0 refills | Status: DC | PRN
  Filled 2023-08-20: qty 60, 15d supply, fill #0

## 2023-08-20 MED FILL — Iron Sucrose Inj 20 MG/ML (Fe Equiv): INTRAVENOUS | Qty: 10 | Status: AC

## 2023-08-21 ENCOUNTER — Other Ambulatory Visit (HOSPITAL_COMMUNITY): Payer: Self-pay

## 2023-08-21 MED ORDER — BUTALBITAL-APAP-CAFFEINE 50-325-40 MG PO TABS
1.0000 | ORAL_TABLET | Freq: Four times a day (QID) | ORAL | 0 refills | Status: DC
  Filled 2023-08-21 – 2023-09-20 (×2): qty 60, 15d supply, fill #0

## 2023-08-23 ENCOUNTER — Inpatient Hospital Stay: Payer: Commercial Managed Care - PPO | Attending: Oncology

## 2023-08-23 VITALS — BP 144/84 | HR 91 | Temp 98.5°F | Resp 14 | Ht 67.0 in | Wt 231.0 lb

## 2023-08-23 DIAGNOSIS — K922 Gastrointestinal hemorrhage, unspecified: Secondary | ICD-10-CM | POA: Insufficient documentation

## 2023-08-23 DIAGNOSIS — D500 Iron deficiency anemia secondary to blood loss (chronic): Secondary | ICD-10-CM | POA: Diagnosis not present

## 2023-08-23 DIAGNOSIS — D539 Nutritional anemia, unspecified: Secondary | ICD-10-CM

## 2023-08-23 MED ORDER — SODIUM CHLORIDE 0.9 % IV SOLN: Freq: Once | INTRAVENOUS | Status: AC

## 2023-08-23 MED ORDER — SODIUM CHLORIDE 0.9 % IV SOLN
200.0000 mg | Freq: Once | INTRAVENOUS | Status: AC
  Administered 2023-08-23: 200 mg via INTRAVENOUS
  Filled 2023-08-23: qty 200

## 2023-08-23 NOTE — Patient Instructions (Signed)
Iron Sucrose Injection What is this medication? IRON SUCROSE (EYE ern SOO krose) treats low levels of iron (iron deficiency anemia) in people with kidney disease. Iron is a mineral that plays an important role in making red blood cells, which carry oxygen from your lungs to the rest of your body. This medicine may be used for other purposes; ask your health care provider or pharmacist if you have questions. COMMON BRAND NAME(S): Venofer What should I tell my care team before I take this medication? They need to know if you have any of these conditions: Anemia not caused by low iron levels Heart disease High levels of iron in the blood Kidney disease Liver disease An unusual or allergic reaction to iron, other medications, foods, dyes, or preservatives Pregnant or trying to get pregnant Breastfeeding How should I use this medication? This medication is for infusion into a vein. It is given in a hospital or clinic setting. Talk to your care team about the use of this medication in children. While this medication may be prescribed for children as young as 2 years for selected conditions, precautions do apply. Overdosage: If you think you have taken too much of this medicine contact a poison control center or emergency room at once. NOTE: This medicine is only for you. Do not share this medicine with others. What if I miss a dose? Keep appointments for follow-up doses. It is important not to miss your dose. Call your care team if you are unable to keep an appointment. What may interact with this medication? Do not take this medication with any of the following: Deferoxamine Dimercaprol Other iron products This medication may also interact with the following: Chloramphenicol Deferasirox This list may not describe all possible interactions. Give your health care provider a list of all the medicines, herbs, non-prescription drugs, or dietary supplements you use. Also tell them if you smoke,  drink alcohol, or use illegal drugs. Some items may interact with your medicine. What should I watch for while using this medication? Visit your care team regularly. Tell your care team if your symptoms do not start to get better or if they get worse. You may need blood work done while you are taking this medication. You may need to follow a special diet. Talk to your care team. Foods that contain iron include: whole grains/cereals, dried fruits, beans, or peas, leafy green vegetables, and organ meats (liver, kidney). What side effects may I notice from receiving this medication? Side effects that you should report to your care team as soon as possible: Allergic reactions--skin rash, itching, hives, swelling of the face, lips, tongue, or throat Low blood pressure--dizziness, feeling faint or lightheaded, blurry vision Shortness of breath Side effects that usually do not require medical attention (report to your care team if they continue or are bothersome): Flushing Headache Joint pain Muscle pain Nausea Pain, redness, or irritation at injection site This list may not describe all possible side effects. Call your doctor for medical advice about side effects. You may report side effects to FDA at 1-800-FDA-1088. Where should I keep my medication? This medication is given in a hospital or clinic. It will not be stored at home. NOTE: This sheet is a summary. It may not cover all possible information. If you have questions about this medicine, talk to your doctor, pharmacist, or health care provider.  2024 Elsevier/Gold Standard (2023-05-07 00:00:00)

## 2023-08-24 MED FILL — Iron Sucrose Inj 20 MG/ML (Fe Equiv): INTRAVENOUS | Qty: 10 | Status: AC

## 2023-08-25 ENCOUNTER — Inpatient Hospital Stay: Payer: Commercial Managed Care - PPO

## 2023-08-25 VITALS — BP 116/79 | HR 68 | Temp 98.3°F | Wt 231.0 lb

## 2023-08-25 DIAGNOSIS — D539 Nutritional anemia, unspecified: Secondary | ICD-10-CM

## 2023-08-25 DIAGNOSIS — K922 Gastrointestinal hemorrhage, unspecified: Secondary | ICD-10-CM | POA: Diagnosis not present

## 2023-08-25 DIAGNOSIS — D500 Iron deficiency anemia secondary to blood loss (chronic): Secondary | ICD-10-CM | POA: Diagnosis not present

## 2023-08-25 MED ORDER — SODIUM CHLORIDE 0.9 % IV SOLN: Freq: Once | INTRAVENOUS | Status: AC

## 2023-08-25 MED ORDER — SODIUM CHLORIDE 0.9 % IV SOLN
200.0000 mg | Freq: Once | INTRAVENOUS | Status: AC
  Administered 2023-08-25: 200 mg via INTRAVENOUS
  Filled 2023-08-25: qty 200

## 2023-08-25 NOTE — Patient Instructions (Signed)
Iron Sucrose Injection What is this medication? IRON SUCROSE (EYE ern SOO krose) treats low levels of iron (iron deficiency anemia) in people with kidney disease. Iron is a mineral that plays an important role in making red blood cells, which carry oxygen from your lungs to the rest of your body. This medicine may be used for other purposes; ask your health care provider or pharmacist if you have questions. COMMON BRAND NAME(S): Venofer What should I tell my care team before I take this medication? They need to know if you have any of these conditions: Anemia not caused by low iron levels Heart disease High levels of iron in the blood Kidney disease Liver disease An unusual or allergic reaction to iron, other medications, foods, dyes, or preservatives Pregnant or trying to get pregnant Breastfeeding How should I use this medication? This medication is for infusion into a vein. It is given in a hospital or clinic setting. Talk to your care team about the use of this medication in children. While this medication may be prescribed for children as young as 2 years for selected conditions, precautions do apply. Overdosage: If you think you have taken too much of this medicine contact a poison control center or emergency room at once. NOTE: This medicine is only for you. Do not share this medicine with others. What if I miss a dose? Keep appointments for follow-up doses. It is important not to miss your dose. Call your care team if you are unable to keep an appointment. What may interact with this medication? Do not take this medication with any of the following: Deferoxamine Dimercaprol Other iron products This medication may also interact with the following: Chloramphenicol Deferasirox This list may not describe all possible interactions. Give your health care provider a list of all the medicines, herbs, non-prescription drugs, or dietary supplements you use. Also tell them if you smoke,  drink alcohol, or use illegal drugs. Some items may interact with your medicine. What should I watch for while using this medication? Visit your care team regularly. Tell your care team if your symptoms do not start to get better or if they get worse. You may need blood work done while you are taking this medication. You may need to follow a special diet. Talk to your care team. Foods that contain iron include: whole grains/cereals, dried fruits, beans, or peas, leafy green vegetables, and organ meats (liver, kidney). What side effects may I notice from receiving this medication? Side effects that you should report to your care team as soon as possible: Allergic reactions--skin rash, itching, hives, swelling of the face, lips, tongue, or throat Low blood pressure--dizziness, feeling faint or lightheaded, blurry vision Shortness of breath Side effects that usually do not require medical attention (report to your care team if they continue or are bothersome): Flushing Headache Joint pain Muscle pain Nausea Pain, redness, or irritation at injection site This list may not describe all possible side effects. Call your doctor for medical advice about side effects. You may report side effects to FDA at 1-800-FDA-1088. Where should I keep my medication? This medication is given in a hospital or clinic. It will not be stored at home. NOTE: This sheet is a summary. It may not cover all possible information. If you have questions about this medicine, talk to your doctor, pharmacist, or health care provider.  2024 Elsevier/Gold Standard (2023-05-07 00:00:00)

## 2023-08-26 ENCOUNTER — Other Ambulatory Visit (HOSPITAL_COMMUNITY): Payer: Self-pay

## 2023-08-26 MED ORDER — CLONAZEPAM 1 MG PO TABS
1.0000 mg | ORAL_TABLET | Freq: Two times a day (BID) | ORAL | 0 refills | Status: DC
Start: 2023-08-26 — End: 2024-01-04
  Filled 2023-08-26: qty 60, 30d supply, fill #0

## 2023-08-26 MED FILL — Iron Sucrose Inj 20 MG/ML (Fe Equiv): INTRAVENOUS | Qty: 10 | Status: AC

## 2023-08-27 ENCOUNTER — Other Ambulatory Visit: Payer: Self-pay

## 2023-08-27 ENCOUNTER — Inpatient Hospital Stay: Payer: Commercial Managed Care - PPO

## 2023-08-27 ENCOUNTER — Encounter: Payer: Self-pay | Admitting: Oncology

## 2023-08-27 ENCOUNTER — Other Ambulatory Visit (HOSPITAL_COMMUNITY): Payer: Self-pay

## 2023-08-27 VITALS — BP 126/79 | HR 71 | Temp 98.2°F | Resp 16 | Ht 67.0 in | Wt 231.2 lb

## 2023-08-27 DIAGNOSIS — D500 Iron deficiency anemia secondary to blood loss (chronic): Secondary | ICD-10-CM | POA: Diagnosis not present

## 2023-08-27 DIAGNOSIS — K922 Gastrointestinal hemorrhage, unspecified: Secondary | ICD-10-CM | POA: Diagnosis not present

## 2023-08-27 DIAGNOSIS — D539 Nutritional anemia, unspecified: Secondary | ICD-10-CM

## 2023-08-27 LAB — SURGICAL PATHOLOGY

## 2023-08-27 MED ORDER — SODIUM CHLORIDE 0.9 % IV SOLN
200.0000 mg | Freq: Once | INTRAVENOUS | Status: AC
  Administered 2023-08-27: 200 mg via INTRAVENOUS
  Filled 2023-08-27: qty 200

## 2023-08-27 MED ORDER — BUPROPION HCL ER (XL) 300 MG PO TB24
300.0000 mg | ORAL_TABLET | Freq: Every day | ORAL | 3 refills | Status: DC
  Filled 2023-08-27: qty 30, 30d supply, fill #0
  Filled 2023-10-08 (×2): qty 30, 30d supply, fill #1
  Filled 2023-11-10: qty 30, 30d supply, fill #2
  Filled 2023-12-13: qty 30, 30d supply, fill #3

## 2023-08-27 MED ORDER — SODIUM CHLORIDE 0.9 % IV SOLN: Freq: Once | INTRAVENOUS | Status: AC

## 2023-08-27 MED FILL — Iron Sucrose Inj 20 MG/ML (Fe Equiv): INTRAVENOUS | Qty: 10 | Status: AC

## 2023-08-27 NOTE — Patient Instructions (Signed)
Iron Sucrose Injection What is this medication? IRON SUCROSE (EYE ern SOO krose) treats low levels of iron (iron deficiency anemia) in people with kidney disease. Iron is a mineral that plays an important role in making red blood cells, which carry oxygen from your lungs to the rest of your body. This medicine may be used for other purposes; ask your health care provider or pharmacist if you have questions. COMMON BRAND NAME(S): Venofer What should I tell my care team before I take this medication? They need to know if you have any of these conditions: Anemia not caused by low iron levels Heart disease High levels of iron in the blood Kidney disease Liver disease An unusual or allergic reaction to iron, other medications, foods, dyes, or preservatives Pregnant or trying to get pregnant Breastfeeding How should I use this medication? This medication is for infusion into a vein. It is given in a hospital or clinic setting. Talk to your care team about the use of this medication in children. While this medication may be prescribed for children as young as 2 years for selected conditions, precautions do apply. Overdosage: If you think you have taken too much of this medicine contact a poison control center or emergency room at once. NOTE: This medicine is only for you. Do not share this medicine with others. What if I miss a dose? Keep appointments for follow-up doses. It is important not to miss your dose. Call your care team if you are unable to keep an appointment. What may interact with this medication? Do not take this medication with any of the following: Deferoxamine Dimercaprol Other iron products This medication may also interact with the following: Chloramphenicol Deferasirox This list may not describe all possible interactions. Give your health care provider a list of all the medicines, herbs, non-prescription drugs, or dietary supplements you use. Also tell them if you smoke,  drink alcohol, or use illegal drugs. Some items may interact with your medicine. What should I watch for while using this medication? Visit your care team regularly. Tell your care team if your symptoms do not start to get better or if they get worse. You may need blood work done while you are taking this medication. You may need to follow a special diet. Talk to your care team. Foods that contain iron include: whole grains/cereals, dried fruits, beans, or peas, leafy green vegetables, and organ meats (liver, kidney). What side effects may I notice from receiving this medication? Side effects that you should report to your care team as soon as possible: Allergic reactions--skin rash, itching, hives, swelling of the face, lips, tongue, or throat Low blood pressure--dizziness, feeling faint or lightheaded, blurry vision Shortness of breath Side effects that usually do not require medical attention (report to your care team if they continue or are bothersome): Flushing Headache Joint pain Muscle pain Nausea Pain, redness, or irritation at injection site This list may not describe all possible side effects. Call your doctor for medical advice about side effects. You may report side effects to FDA at 1-800-FDA-1088. Where should I keep my medication? This medication is given in a hospital or clinic. It will not be stored at home. NOTE: This sheet is a summary. It may not cover all possible information. If you have questions about this medicine, talk to your doctor, pharmacist, or health care provider.  2024 Elsevier/Gold Standard (2023-05-07 00:00:00)

## 2023-08-30 ENCOUNTER — Inpatient Hospital Stay: Payer: Commercial Managed Care - PPO

## 2023-08-30 ENCOUNTER — Encounter: Payer: Self-pay | Admitting: Gastroenterology

## 2023-08-30 ENCOUNTER — Other Ambulatory Visit (HOSPITAL_COMMUNITY): Payer: Self-pay

## 2023-08-30 VITALS — BP 146/105 | HR 85 | Temp 98.0°F | Resp 16 | Ht 67.0 in | Wt 227.1 lb

## 2023-08-30 DIAGNOSIS — D539 Nutritional anemia, unspecified: Secondary | ICD-10-CM

## 2023-08-30 DIAGNOSIS — K922 Gastrointestinal hemorrhage, unspecified: Secondary | ICD-10-CM | POA: Diagnosis not present

## 2023-08-30 DIAGNOSIS — D500 Iron deficiency anemia secondary to blood loss (chronic): Secondary | ICD-10-CM | POA: Diagnosis not present

## 2023-08-30 MED ORDER — SODIUM CHLORIDE 0.9 % IV SOLN
200.0000 mg | Freq: Once | INTRAVENOUS | Status: AC
  Administered 2023-08-30: 200 mg via INTRAVENOUS
  Filled 2023-08-30: qty 200

## 2023-08-30 MED ORDER — SODIUM CHLORIDE 0.9 % IV SOLN: Freq: Once | INTRAVENOUS | Status: AC

## 2023-08-30 NOTE — Patient Instructions (Signed)
Iron Sucrose Injection What is this medication? IRON SUCROSE (EYE ern SOO krose) treats low levels of iron (iron deficiency anemia) in people with kidney disease. Iron is a mineral that plays an important role in making red blood cells, which carry oxygen from your lungs to the rest of your body. This medicine may be used for other purposes; ask your health care provider or pharmacist if you have questions. COMMON BRAND NAME(S): Venofer What should I tell my care team before I take this medication? They need to know if you have any of these conditions: Anemia not caused by low iron levels Heart disease High levels of iron in the blood Kidney disease Liver disease An unusual or allergic reaction to iron, other medications, foods, dyes, or preservatives Pregnant or trying to get pregnant Breastfeeding How should I use this medication? This medication is for infusion into a vein. It is given in a hospital or clinic setting. Talk to your care team about the use of this medication in children. While this medication may be prescribed for children as young as 2 years for selected conditions, precautions do apply. Overdosage: If you think you have taken too much of this medicine contact a poison control center or emergency room at once. NOTE: This medicine is only for you. Do not share this medicine with others. What if I miss a dose? Keep appointments for follow-up doses. It is important not to miss your dose. Call your care team if you are unable to keep an appointment. What may interact with this medication? Do not take this medication with any of the following: Deferoxamine Dimercaprol Other iron products This medication may also interact with the following: Chloramphenicol Deferasirox This list may not describe all possible interactions. Give your health care provider a list of all the medicines, herbs, non-prescription drugs, or dietary supplements you use. Also tell them if you smoke,  drink alcohol, or use illegal drugs. Some items may interact with your medicine. What should I watch for while using this medication? Visit your care team regularly. Tell your care team if your symptoms do not start to get better or if they get worse. You may need blood work done while you are taking this medication. You may need to follow a special diet. Talk to your care team. Foods that contain iron include: whole grains/cereals, dried fruits, beans, or peas, leafy green vegetables, and organ meats (liver, kidney). What side effects may I notice from receiving this medication? Side effects that you should report to your care team as soon as possible: Allergic reactions--skin rash, itching, hives, swelling of the face, lips, tongue, or throat Low blood pressure--dizziness, feeling faint or lightheaded, blurry vision Shortness of breath Side effects that usually do not require medical attention (report to your care team if they continue or are bothersome): Flushing Headache Joint pain Muscle pain Nausea Pain, redness, or irritation at injection site This list may not describe all possible side effects. Call your doctor for medical advice about side effects. You may report side effects to FDA at 1-800-FDA-1088. Where should I keep my medication? This medication is given in a hospital or clinic. It will not be stored at home. NOTE: This sheet is a summary. It may not cover all possible information. If you have questions about this medicine, talk to your doctor, pharmacist, or health care provider.  2024 Elsevier/Gold Standard (2023-05-07 00:00:00)

## 2023-08-31 ENCOUNTER — Other Ambulatory Visit (HOSPITAL_COMMUNITY): Payer: Self-pay

## 2023-08-31 MED ORDER — BACLOFEN 10 MG PO TABS
10.0000 mg | ORAL_TABLET | Freq: Three times a day (TID) | ORAL | 2 refills | Status: DC
  Filled 2023-08-31: qty 90, 30d supply, fill #0
  Filled 2023-09-28: qty 90, 30d supply, fill #1
  Filled 2023-10-28: qty 90, 30d supply, fill #2

## 2023-08-31 MED FILL — Iron Sucrose Inj 20 MG/ML (Fe Equiv): INTRAVENOUS | Qty: 10 | Status: AC

## 2023-09-01 ENCOUNTER — Inpatient Hospital Stay: Payer: Commercial Managed Care - PPO

## 2023-09-01 ENCOUNTER — Other Ambulatory Visit (HOSPITAL_COMMUNITY): Payer: Self-pay

## 2023-09-01 VITALS — BP 134/88 | HR 69 | Temp 98.1°F | Resp 18

## 2023-09-01 DIAGNOSIS — D500 Iron deficiency anemia secondary to blood loss (chronic): Secondary | ICD-10-CM | POA: Diagnosis not present

## 2023-09-01 DIAGNOSIS — D539 Nutritional anemia, unspecified: Secondary | ICD-10-CM

## 2023-09-01 DIAGNOSIS — K922 Gastrointestinal hemorrhage, unspecified: Secondary | ICD-10-CM | POA: Diagnosis not present

## 2023-09-01 MED ORDER — SODIUM CHLORIDE 0.9 % IV SOLN
200.0000 mg | Freq: Once | INTRAVENOUS | Status: AC
  Administered 2023-09-01: 200 mg via INTRAVENOUS
  Filled 2023-09-01: qty 200

## 2023-09-01 MED ORDER — SODIUM CHLORIDE 0.9 % IV SOLN: Freq: Once | INTRAVENOUS | Status: AC

## 2023-09-01 NOTE — Patient Instructions (Signed)
Iron Sucrose Injection What is this medication? IRON SUCROSE (EYE ern SOO krose) treats low levels of iron (iron deficiency anemia) in people with kidney disease. Iron is a mineral that plays an important role in making red blood cells, which carry oxygen from your lungs to the rest of your body. This medicine may be used for other purposes; ask your health care provider or pharmacist if you have questions. COMMON BRAND NAME(S): Venofer What should I tell my care team before I take this medication? They need to know if you have any of these conditions: Anemia not caused by low iron levels Heart disease High levels of iron in the blood Kidney disease Liver disease An unusual or allergic reaction to iron, other medications, foods, dyes, or preservatives Pregnant or trying to get pregnant Breastfeeding How should I use this medication? This medication is for infusion into a vein. It is given in a hospital or clinic setting. Talk to your care team about the use of this medication in children. While this medication may be prescribed for children as young as 2 years for selected conditions, precautions do apply. Overdosage: If you think you have taken too much of this medicine contact a poison control center or emergency room at once. NOTE: This medicine is only for you. Do not share this medicine with others. What if I miss a dose? Keep appointments for follow-up doses. It is important not to miss your dose. Call your care team if you are unable to keep an appointment. What may interact with this medication? Do not take this medication with any of the following: Deferoxamine Dimercaprol Other iron products This medication may also interact with the following: Chloramphenicol Deferasirox This list may not describe all possible interactions. Give your health care provider a list of all the medicines, herbs, non-prescription drugs, or dietary supplements you use. Also tell them if you smoke,  drink alcohol, or use illegal drugs. Some items may interact with your medicine. What should I watch for while using this medication? Visit your care team regularly. Tell your care team if your symptoms do not start to get better or if they get worse. You may need blood work done while you are taking this medication. You may need to follow a special diet. Talk to your care team. Foods that contain iron include: whole grains/cereals, dried fruits, beans, or peas, leafy green vegetables, and organ meats (liver, kidney). What side effects may I notice from receiving this medication? Side effects that you should report to your care team as soon as possible: Allergic reactions--skin rash, itching, hives, swelling of the face, lips, tongue, or throat Low blood pressure--dizziness, feeling faint or lightheaded, blurry vision Shortness of breath Side effects that usually do not require medical attention (report to your care team if they continue or are bothersome): Flushing Headache Joint pain Muscle pain Nausea Pain, redness, or irritation at injection site This list may not describe all possible side effects. Call your doctor for medical advice about side effects. You may report side effects to FDA at 1-800-FDA-1088. Where should I keep my medication? This medication is given in a hospital or clinic. It will not be stored at home. NOTE: This sheet is a summary. It may not cover all possible information. If you have questions about this medicine, talk to your doctor, pharmacist, or health care provider.  2024 Elsevier/Gold Standard (2023-05-07 00:00:00)

## 2023-09-03 ENCOUNTER — Other Ambulatory Visit (HOSPITAL_COMMUNITY): Payer: Self-pay

## 2023-09-04 ENCOUNTER — Other Ambulatory Visit (HOSPITAL_COMMUNITY): Payer: Self-pay

## 2023-09-07 ENCOUNTER — Other Ambulatory Visit (HOSPITAL_COMMUNITY): Payer: Self-pay

## 2023-09-07 MED ORDER — ESTRADIOL 0.075 MG/24HR TD PTTW
1.0000 | MEDICATED_PATCH | TRANSDERMAL | 3 refills | Status: AC
  Filled 2023-09-07: qty 24, 84d supply, fill #0
  Filled 2023-11-30: qty 24, 84d supply, fill #1
  Filled 2024-03-08: qty 24, 84d supply, fill #2

## 2023-09-08 ENCOUNTER — Other Ambulatory Visit (HOSPITAL_COMMUNITY): Payer: Self-pay

## 2023-09-09 NOTE — Progress Notes (Deleted)
REFERRING PROVIDER: Noberto Retort, MD [phone] [address] [place],  [place] [address]  PRIMARY PROVIDER:  Noberto Retort, MD  PRIMARY REASON FOR VISIT:  No diagnosis found.   HISTORY OF PRESENT ILLNESS:   Theresa Henry, a 57 y.o. female, was seen for a [place] cancer genetics consultation at the request of Dr. Angelene Giovanni due to a family history of ovarian cancer.  Theresa Henry presents to clinic today to discuss the possibility of a hereditary predisposition to cancer, to discuss genetic testing, and to further clarify her future cancer risks, as well as potential cancer risks for family members.   Theresa Henry is a 57 y.o. female with no personal history of cancer.    CANCER HISTORY:  Oncology History   No history exists.     RISK FACTORS:  Mammogram within the last year: ***. Category b density in 2019.  Number of breast biopsies: {Numbers 1-12 multi-select:20307}. Colonoscopy: yes;  most recent in 2024 . Four lifetime tubular adenomas.  Hysterectomy: yes.  Ovaries intact: no.  Menarche was at age ***.  First live birth at age ***.  Menopausal status: {Menopause:31378}.  OCP use for approximately {Numbers 1-12 multi-select:20307} years.  HRT use: {Numbers 1-12 multi-select:20307} years. Dermatology screening: ***  Past Medical History:  Diagnosis Date   Allergy    Anemia    Anxiety    GERD (gastroesophageal reflux disease)    Hypertension    Migraine    Obesity     Past Surgical History:  Procedure Laterality Date   ABDOMINAL HYSTERECTOMY     per pt-had vaginal hysterectomy!   BREAST REDUCTION SURGERY     CARPAL TUNNEL RELEASE     right hand done 01/2017/left CTR 12/202018   CERVICAL DISC SURGERY  02/05/2017   c4-c56   CERVICAL DISCECTOMY     LAPAROSCOPY     LUMBAR MICRODISCECTOMY     REDUCTION MAMMAPLASTY Bilateral 2001   TYMPANOSTOMY TUBE PLACEMENT     WISDOM TOOTH EXTRACTION        FAMILY HISTORY:  We obtained a detailed, 4-generation  family history.  Significant diagnoses are listed below: Family History  Problem Relation Age of Onset   Ovarian cancer Mother    High Cholesterol Father    Heart disease Father    Migraines Brother    Migraines Paternal Grandfather    Migraines Daughter    Breast cancer Maternal Aunt        in 81's or 48's   Liver disease Neg Hx    Colon cancer Neg Hx    Esophageal cancer Neg Hx     Theresa Henry is {aware/unaware} of previous family history of genetic testing for hereditary cancer risks. Patient's maternal ancestors are of *** descent, and paternal ancestors are of *** descent. There {IS NO:12509} reported Ashkenazi Jewish ancestry. There {IS NO:12509} known consanguinity.  GENETIC COUNSELING ASSESSMENT: Theresa Henry is a 57 y.o. female with a {Personal/family:20331} history of {cancer/polyps} which is somewhat suggestive of a {DISEASE} and predisposition to cancer given ***. We, therefore, discussed and recommended the following at today's visit.   DISCUSSION: We discussed that *** - ***% of *** is hereditary, with most cases of hereditary *** cancer associated with ***.  There are other genes that can be associated with hereditary *** cancer syndromes.  These include ***.  We discussed that testing is beneficial for several reasons, including knowing about other cancer risks, identifying potential screening and risk-reduction options that may be appropriate, and  to understanding if other family members could be at risk for cancer and allowing them to undergo genetic testing.  We reviewed the characteristics, features and inheritance patterns of hereditary cancer syndromes. We also discussed genetic testing, including the appropriate family members to test, the process of testing, insurance coverage and turn-around-time for results. We discussed the implications of a negative, positive, and variant of uncertain significant result. ***We discussed that negative results would be uninformative  given that Ms. Oke does not have a personal history of cancer. We recommended Theresa Henry pursue genetic testing for a panel that contains genes associated with ***.  Theresa Henry was offered a common hereditary cancer panel (48 genes) and an expanded pan-cancer panel (85 genes). Theresa Henry was informed of the benefits and limitations of each panel, including that expanded pan-cancer panels contain several genes that do not have clear management guidelines at this point in time.  We also discussed that as the number of genes included on a panel increases, the chances of variants of uncertain significance increases.  After considering the benefits and limitations of each gene panel, Theresa Henry elected to have an *** through ***.   Based on Theresa Henry's {Personal/family:20331} history of cancer, she meets medical criteria for genetic testing. Despite that she meets criteria, she may still have an out of pocket cost. We discussed that if her out of pocket cost for testing is over $100, the laboratory should contact them to discuss self-pay options and/or patient pay assistance programs.   ***We reviewed the characteristics, features and inheritance patterns of hereditary cancer syndromes. We also discussed genetic testing, including the appropriate family members to test, the process of testing, insurance coverage and turn-around-time for results. We discussed the implications of a negative, positive and/or variant of uncertain significant result. In order to get genetic test results in a timely manner so that Ms. Tees can use these genetic test results for surgical decisions, we recommended Theresa Henry pursue genetic testing for the ***. Once complete, we recommend Theresa Henry pursue reflex genetic testing to the *** gene panel.   Based on Theresa Henry's {Personal/family:20331} history of cancer, she meets medical criteria for genetic testing. Despite that she meets criteria, she may still have an out  of pocket cost.   ***We discussed with Theresa Henry that the {Personal/family:20331} history does not meet insurance or NCCN criteria for genetic testing and, therefore, is not highly consistent with a familial hereditary cancer syndrome.  We feel she is at low risk to harbor a gene mutation associated with such a condition. Thus, we did not recommend any genetic testing, at this time, and recommended Ms. Thompsen continue to follow the cancer screening guidelines given by her primary healthcare provider.  ***In order to estimate her chance of having a {CA GENE:62345} mutation, we used statistical models ({GENMODELS:62370}) that consider her personal medical history, family history and ancestry.  Because each model is different, there can be a lot of variability in the risks they give.  Therefore, these numbers must be considered a rough range and not a precise risk of having a {CA GENE:62345} mutation.  These models estimate that she has approximately a ***-***% chance of having a mutation. Based on this assessment of her family and personal history, genetic testing {IS/ISNOT:34056} recommended.  ***Based on the patient's {Personal/family:20331} history, a statistical model ({GENMODELS:62370}) was used to estimate her risk of developing {CA HX:54794}. This estimates her lifetime risk of developing {CA HX:54794} to be approximately ***%. This estimation  does not consider any genetic testing results.  The patient's lifetime breast cancer risk is a preliminary estimate based on available information using one of several models endorsed by the American Cancer Society (ACS). The ACS recommends consideration of breast MRI screening as an adjunct to mammography for patients at high risk (defined as 20% or greater lifetime risk).   ***Ms. Mcduffey has been determined to be at high risk for breast cancer.  Therefore, we recommend that annual screening with mammography and breast MRI be performed.  ***begin at age 51,  or 10 years prior to the age of breast cancer diagnosis in a relative (whichever is earlier).  We discussed that Ms. Matts should discuss her individual situation with her referring physician and determine a breast cancer screening plan with which they are both comfortable.    We discussed the Genetic Information Non-Discrimination Act (GINA) of 2008, which helps protect individuals against genetic discrimination based on their genetic test results.  It impacts both health insurance and employment.  With health insurance, it protects against genetic test results being used for increased premiums or policy termination. For employment, it protects against hiring, firing and promoting decisions based on genetic test results.  GINA does not apply to those in the [employer], those who work for companies with less than 15 employees, and new life insurance or long-term disability insurance policies.  Health status due to a cancer diagnosis is not protected under GINA.  PLAN: After considering the risks, benefits, and limitations, Ms. Chaffer provided informed consent to pursue genetic testing and the blood sample was sent to {Lab} Laboratories for analysis of the {test}. Results should be available within approximately {TAT TIME} weeks' time, at which point they will be disclosed by telephone to Ms. Stremel, as will any additional recommendations warranted by these results. Ms. Wuollet will receive a summary of her genetic counseling visit and a copy of her results once available. This information will also be available in Epic.   *** Despite our recommendation, Ms. Tait did not wish to pursue genetic testing at today's visit. We understand this decision and remain available to coordinate genetic testing at any time in the future. We, therefore, recommend Ms. Creagh continue to follow the cancer screening guidelines given by her primary healthcare provider.  ***Based on Ms. Ridings's family history, we  recommended her ***, who was diagnosed with *** at age ***, have genetic counseling and testing. Ms. Blacknall will let us know if we can be of any assistance in coordinating genetic counseling and/or testing for this family member.   Lastly, we encouraged Ms. Hickel to remain in contact with cancer genetics annually so that we can continuously update the family history and inform her of any changes in cancer genetics and testing that may be of benefit for this family.   Ms. Baloun questions were answered to her satisfaction today. Our contact information was provided should additional questions or concerns arise. ***Thank you for the referral and allowing [place] to share in the care of your patient.   Mardene Lessig M. Rennie Plowman, MS, [facility] Genetic Counselor [email or web address] (P) [phone]   The patient was seen for a total of *** minutes in face-to-face genetic counseling.  ***The was patient was accompanied by ***.  ***The patient was seen alone.  Drs. Pamelia Hoit and/or Mosetta Putt were available to discuss this case as needed.  _______________________________________________________________________ For Office Staff:  Number of people involved in session: *** Was an Intern/ student involved with case: {YES/NO:63}

## 2023-09-10 ENCOUNTER — Telehealth: Payer: Self-pay | Admitting: Genetic Counselor

## 2023-09-10 NOTE — Telephone Encounter (Signed)
Per Verlin Dike, patient requested to cancel genetics appt.

## 2023-09-13 ENCOUNTER — Inpatient Hospital Stay: Payer: Commercial Managed Care - PPO

## 2023-09-13 ENCOUNTER — Inpatient Hospital Stay: Payer: Commercial Managed Care - PPO | Admitting: Genetic Counselor

## 2023-09-20 ENCOUNTER — Other Ambulatory Visit (HOSPITAL_COMMUNITY): Payer: Self-pay

## 2023-09-21 ENCOUNTER — Other Ambulatory Visit (HOSPITAL_COMMUNITY): Payer: Self-pay

## 2023-09-21 MED ORDER — AMLODIPINE BESYLATE 10 MG PO TABS
10.0000 mg | ORAL_TABLET | Freq: Every day | ORAL | 1 refills | Status: DC
  Filled 2023-09-21: qty 90, 90d supply, fill #0
  Filled 2023-12-29: qty 90, 90d supply, fill #1

## 2023-09-28 ENCOUNTER — Other Ambulatory Visit (HOSPITAL_COMMUNITY): Payer: Self-pay

## 2023-09-28 ENCOUNTER — Encounter: Payer: Self-pay | Admitting: Oncology

## 2023-09-28 DIAGNOSIS — J069 Acute upper respiratory infection, unspecified: Secondary | ICD-10-CM | POA: Diagnosis not present

## 2023-09-28 DIAGNOSIS — Z03818 Encounter for observation for suspected exposure to other biological agents ruled out: Secondary | ICD-10-CM | POA: Diagnosis not present

## 2023-09-28 MED ORDER — BENZONATATE 200 MG PO CAPS
200.0000 mg | ORAL_CAPSULE | Freq: Three times a day (TID) | ORAL | 0 refills | Status: DC | PRN
  Filled 2023-09-28: qty 15, 5d supply, fill #0

## 2023-09-28 MED ORDER — GUAIFENESIN ER 1200 MG PO TB12
1200.0000 mg | ORAL_TABLET | Freq: Two times a day (BID) | ORAL | 0 refills | Status: AC | PRN
  Filled 2023-09-28: qty 10, 5d supply, fill #0

## 2023-10-01 ENCOUNTER — Other Ambulatory Visit (HOSPITAL_COMMUNITY): Payer: Self-pay

## 2023-10-01 MED ORDER — CLONAZEPAM 1 MG PO TABS
1.0000 mg | ORAL_TABLET | Freq: Two times a day (BID) | ORAL | 0 refills | Status: DC | PRN
Start: 2023-10-01 — End: 2024-01-04
  Filled 2023-10-01: qty 60, 30d supply, fill #0

## 2023-10-04 ENCOUNTER — Inpatient Hospital Stay: Payer: Commercial Managed Care - PPO

## 2023-10-04 ENCOUNTER — Inpatient Hospital Stay: Payer: Commercial Managed Care - PPO | Attending: Oncology | Admitting: Oncology

## 2023-10-04 VITALS — BP 159/89 | HR 78 | Temp 97.7°F | Resp 16 | Ht 67.0 in | Wt 225.0 lb

## 2023-10-04 DIAGNOSIS — K922 Gastrointestinal hemorrhage, unspecified: Secondary | ICD-10-CM | POA: Diagnosis not present

## 2023-10-04 DIAGNOSIS — E538 Deficiency of other specified B group vitamins: Secondary | ICD-10-CM

## 2023-10-04 DIAGNOSIS — K317 Polyp of stomach and duodenum: Secondary | ICD-10-CM | POA: Diagnosis not present

## 2023-10-04 DIAGNOSIS — D539 Nutritional anemia, unspecified: Secondary | ICD-10-CM

## 2023-10-04 DIAGNOSIS — D500 Iron deficiency anemia secondary to blood loss (chronic): Secondary | ICD-10-CM | POA: Diagnosis not present

## 2023-10-04 DIAGNOSIS — Z8041 Family history of malignant neoplasm of ovary: Secondary | ICD-10-CM

## 2023-10-04 LAB — CBC WITH DIFFERENTIAL/PLATELET
Abs Immature Granulocytes: 0.03 10*3/uL (ref 0.00–0.07)
Basophils Absolute: 0.1 10*3/uL (ref 0.0–0.1)
Basophils Relative: 1 %
Eosinophils Absolute: 0.2 10*3/uL (ref 0.0–0.5)
Eosinophils Relative: 2 %
HCT: 45.2 % (ref 36.0–46.0)
Hemoglobin: 13.9 g/dL (ref 12.0–15.0)
Immature Granulocytes: 0 %
Lymphocytes Relative: 31 %
Lymphs Abs: 2.5 10*3/uL (ref 0.7–4.0)
MCH: 26.7 pg (ref 26.0–34.0)
MCHC: 30.8 g/dL (ref 30.0–36.0)
MCV: 86.8 fL (ref 80.0–100.0)
Monocytes Absolute: 0.5 10*3/uL (ref 0.1–1.0)
Monocytes Relative: 6 %
Neutro Abs: 4.8 10*3/uL (ref 1.7–7.7)
Neutrophils Relative %: 60 %
Platelets: 291 10*3/uL (ref 150–400)
RBC: 5.21 MIL/uL — ABNORMAL HIGH (ref 3.87–5.11)
RDW: 22.1 % — ABNORMAL HIGH (ref 11.5–15.5)
WBC: 8.1 10*3/uL (ref 4.0–10.5)
nRBC: 0 % (ref 0.0–0.2)

## 2023-10-04 LAB — VITAMIN B12: Vitamin B-12: 341 pg/mL (ref 180–914)

## 2023-10-04 LAB — FOLATE: Folate: 4.9 ng/mL — ABNORMAL LOW (ref >5.9)

## 2023-10-04 LAB — FERRITIN: Ferritin: 200 ng/mL (ref 11–307)

## 2023-10-04 NOTE — Patient Instructions (Signed)
Please begin either a women's multivitamin with iron or a children's multivitamin with iron daily

## 2023-10-04 NOTE — Progress Notes (Signed)
[facility] Cancer Follow up Visit:  Patient Care Team: Noberto Retort, MD as PCP - General (Family Medicine)  CHIEF COMPLAINTS/PURPOSE OF CONSULTATION:  HISTORY OF PRESENTING ILLNESS: Theresa Henry 57 y.o. female is here because of  anemia Medial history notable for [facility] January 21 2018:  Colonoscopy done for family history of colorectal cancer in multiple second-degree relatives with two 5-8 mm polyps in the rectum and descending colon and small nonbleeding angiodysplasia in the ascending colon. Repeat recommended in 5 years. Pathology showed tubular adenomas.   June 16 2023:  GI consult--Scheduled patient for diagnostic EGD and colonoscopy...given history of adenomatous polyps, family history of colon cancer and iron deficiency anemia.    WBC 9.6 hemoglobin 9.2 MCV 62 platelet count 293; 63 segs 27 lymphs 8 monos 2 eos CMP notable for glucose of 164  July 05 2023:  [facility] Hematology Consult  No history of hemorrhage requiring transfusion.  Has tried taking iron sulfate 325 mg daily which she can not tolerate.  Has not received IV iron/ required PRBC's in the past.  Does have a normal diet.  No history of hemorrhage postoperatively requiring transfusion.  No melena, hemoptysis, hematuria.  Has occasional hemorrhoids and notes occasional hematochezia.  No change in stool caliber, constipation.  No history of intra-articular or soft tissue bleeding.  Does not regularly use NSAIDS for pain.  Is not taking oral anticoagulants  Is not taking antiplatelet drugs.  No history of abnormal bleeding in family members.  Patient has symptoms of fatigue, pallor,  DOE, palpitations, decreased performance status.  Patient has pica to ice but not to starch/dirt.    Social:  Works for [employer].  No tobacco or EtOH  [facility] Mother died 30 ovarian cancer Father alive 67 Alzheimer's Brother died 54 MI Paternal Uncle died with history of colon cancer  Retic count 2.0%.  Direct  Coombs test negative Vitamin B12 265  July 20, 2023 through July 29, 2023: Received total of 1000 mg of Venofer  August 04, 2023: Scheduled follow-up for anemia.  Has more energy; even husband notices.  Still gets SOB with some activities.  Ice pica improving but not resolved Hgb 10.0  MCV 73  Ferritin 121   August 18 2023: EGD--multiple 3 to 8 mm sessile polyps found in the gastric fundus and body.  8 polyps with stigmata of recent bleeding and several larger polyps removed.  Duodenal bulb and second portion of duodenum normal.  Biopsies taken Colonoscopy--single medium size angioectasia without bleeding in ascending colon.  1 sessile polyp in sigmoid colon, 2 in transverse colon measuring 5 to 7 mm in size.  3 mm polyp in ascending colon Pathology--  EGD demonstrated fundic gland polyps.  Negative for dysplasia or malignancy.  Duodenal mucosa without abnormality.  Negative for celiac disease Colon polyps demonstrated adenomas without dysplasia  September 10 2023:  Patient cancelled genetic counseling appointment  October 04, 2023: Scheduled follow-up for anemia and family history of cancer.  Reviewed results of endoscopy and pathology with patient.  Feels well, energy level is good.  No ice pica.  No DOE.  Not currently taking low dose iron.  Takes Excedrin migraine occasionally.    WBC 8.1 hemoglobin 13.9 platelet count 291; 60 segs 21 lymphs 6 monos 2 eos 1 Baso Ferritin 200 folate 4.9 B12 341   Review of Systems - Oncology  MEDICAL HISTORY: Past Medical History:  Diagnosis Date   Allergy    Anemia  Anxiety    GERD (gastroesophageal reflux disease)    Hypertension    Migraine    Obesity     SURGICAL HISTORY: Past Surgical History:  Procedure Laterality Date   ABDOMINAL HYSTERECTOMY     per pt-had vaginal hysterectomy!   BREAST REDUCTION SURGERY     CARPAL TUNNEL RELEASE     right hand done 01/2017/left CTR 12/202018   CERVICAL DISC SURGERY  02/05/2017   c4-c56    CERVICAL DISCECTOMY     LAPAROSCOPY     LUMBAR MICRODISCECTOMY     REDUCTION MAMMAPLASTY Bilateral 2001   TYMPANOSTOMY TUBE PLACEMENT     WISDOM TOOTH EXTRACTION      SOCIAL HISTORY: Social History   Socioeconomic History   Marital status: Married    Spouse name: Brett Canales   Number of children: 1   Years of education: 12+4   Highest education level: Bachelor's degree (e.g., BA, AB, BS)  Occupational History   Not on file  Tobacco Use   Smoking status: Never   Smokeless tobacco: Never  Vaping Use   Vaping status: Never Used  Substance and Sexual Activity   Alcohol use: Never   Drug use: Never   Sexual activity: Not Currently  Other Topics Concern   Not on file  Social History Narrative   Not on file   Social Determinants of Health   Financial Resource Strain: Not on file  Food Insecurity: No Food Insecurity (07/05/2023)   Hunger Vital Sign    Worried About Running Out of Food in the Last Year: Never true    Ran Out of Food in the Last Year: Never true  Transportation Needs: No Transportation Needs (07/05/2023)   PRAPARE - Administrator, Civil Service (Medical): No    Lack of Transportation (Non-Medical): No  Physical Activity: Not on file  Stress: Not on file  Social Connections: Not on file  Intimate Partner Violence: Not At Risk (07/05/2023)   Humiliation, Afraid, Rape, and Kick questionnaire    Fear of Current or Ex-Partner: No    Emotionally Abused: No    Physically Abused: No    Sexually Abused: No    FAMILY HISTORY Family History  Problem Relation Age of Onset   Ovarian cancer Mother    High Cholesterol Father    Heart disease Father    Migraines Brother    Migraines Paternal Grandfather    Migraines Daughter    Breast cancer Maternal Aunt        in 19's or 71's   Liver disease Neg Hx    Colon cancer Neg Hx    Esophageal cancer Neg Hx     ALLERGIES:  is allergic to nitrous oxide, diphenhydramine, sulfa antibiotics,  sulfamethoxazole-trimethoprim, amoxicillin, amoxicillin-pot clavulanate, band-aid friction block [foot care products], erythromycin base, and penicillins.  MEDICATIONS:  Current Outpatient Medications  Medication Sig Dispense Refill   amLODipine (NORVASC) 10 MG tablet Take 1 tablet (10 mg total) by mouth daily. 90 tablet 1   aspirin-acetaminophen-caffeine (EXCEDRIN MIGRAINE) 250-250-65 MG tablet 2 tablets Orally Once a day     baclofen (LIORESAL) 10 MG tablet Take 1 tablet (10 mg total) by mouth 3 (three) times daily with food or milk 90 tablet 2   benzonatate (TESSALON) 200 MG capsule Take 1 capsule (200 mg total) by mouth 3 (three) times daily as needed. 15 capsule 0   buPROPion (WELLBUTRIN XL) 300 MG 24 hr tablet Take 1 tablet (300 mg total) by mouth daily. 30  tablet 3   butalbital-acetaminophen-caffeine (FIORICET) 50-325-40 MG tablet Take 1 tablet by mouth every 6 (six) hours as needed. 60 tablet 0   butalbital-acetaminophen-caffeine (FIORICET) 50-325-40 MG tablet Take 1 tablet by mouth every 6 (six) hours as needed. 60 tablet 0   clonazePAM (KLONOPIN) 1 MG tablet Take 1 tablet (1 mg) by mouth 2 times daily as needed for anxiety 60 tablet 0   clonazePAM (KLONOPIN) 1 MG tablet Take 1 tablet (1 mg total) by mouth 2 (two) times daily as needed for anxiety 60 tablet 0   clonazePAM (KLONOPIN) 1 MG tablet Take 1 tablet (1 mg total) by mouth 2 (two) times daily as needed for anxiety. 60 tablet 0   cloNIDine (CATAPRES) 0.2 MG tablet Take 1 tablet (0.2 mg total) by mouth 2 (two) times daily. 180 tablet 0   clotrimazole-betamethasone (LOTRISONE) cream Apply to affected areas 2 times a day for 14 days 15 g 1   escitalopram (LEXAPRO) 20 MG tablet Take 1 tablet (20 mg total) by mouth daily. 90 tablet 3   esomeprazole (NEXIUM) 40 MG capsule Take 1 capsule (40 mg total) by mouth daily. 90 capsule 0   estradiol (VIVELLE-DOT) 0.075 MG/24HR Place 1 patch onto the skin 2 (two) times a week as directed. 24 patch  3   Guaifenesin 1200 MG TB12 Take 1 tablet (1,200 mg total) by mouth every 12 (twelve) hours as needed. 10 tablet 0   irbesartan (AVAPRO) 300 MG tablet Take 1 tablet (300 mg total) by mouth daily. 90 tablet 0   metFORMIN (GLUCOPHAGE-XR) 500 MG 24 hr tablet Take 1 tablet (500 mg total) by mouth every evening with a meal. 30 tablet 5   MINIVELLE 0.075 MG/24HR Place 1 patch onto the skin 2 (two) times a week.  12   promethazine (PHENERGAN) 25 MG tablet Take 1 tablet by mouth every 12 hours as needed 60 tablet 3   temazepam (RESTORIL) 30 MG capsule Take 1 capsule (30 mg total) by mouth at bedtime as needed. 90 capsule 1   temazepam (RESTORIL) 30 MG capsule Take 1 capsule (30 mg) by mouth at bedtime as needed 90 capsule 0   No current facility-administered medications for this visit.    PHYSICAL EXAMINATION:  ECOG PERFORMANCE STATUS: 1 - Symptomatic but completely ambulatory   There were no vitals filed for this visit.   There were no vitals filed for this visit.    Physical Exam Vitals and nursing note reviewed.  Constitutional:      General: She is not in acute distress.    Appearance: Normal appearance. She is obese. She is not ill-appearing, toxic-appearing or diaphoretic.  HENT:     Head: Normocephalic and atraumatic.     Right Ear: External ear normal.     Left Ear: External ear normal.     Nose: Nose normal. No congestion or rhinorrhea.  Eyes:     General: No scleral icterus.    Extraocular Movements: Extraocular movements intact.     Conjunctiva/sclera: Conjunctivae normal.     Pupils: Pupils are equal, round, and reactive to light.  Cardiovascular:     Rate and Rhythm: Normal rate and regular rhythm.     Heart sounds: Normal heart sounds. No murmur heard.    No friction rub. No gallop.  Pulmonary:     Effort: Pulmonary effort is normal. No respiratory distress.     Breath sounds: Normal breath sounds. No wheezing or rhonchi.  Abdominal:     General: Bowel  sounds are  normal.     Palpations: Abdomen is soft.     Tenderness: There is no abdominal tenderness. There is no guarding or rebound.  Musculoskeletal:        General: No swelling, tenderness or deformity.     Cervical back: Normal range of motion and neck supple. No rigidity or tenderness.  Lymphadenopathy:     Head:     Right side of head: No submental, submandibular, tonsillar, preauricular, posterior auricular or occipital adenopathy.     Left side of head: No submental, submandibular, tonsillar, preauricular, posterior auricular or occipital adenopathy.     Cervical: No cervical adenopathy.     Right cervical: No superficial, deep or posterior cervical adenopathy.    Left cervical: No superficial, deep or posterior cervical adenopathy.     Upper Body:     Right upper body: No supraclavicular, axillary, pectoral or epitrochlear adenopathy.     Left upper body: No supraclavicular, axillary, pectoral or epitrochlear adenopathy.  Skin:    General: Skin is warm.     Coloration: Skin is pale. Skin is not jaundiced.     Findings: No bruising.  Neurological:     General: No focal deficit present.     Mental Status: She is alert and oriented to person, place, and time.     Cranial Nerves: No cranial nerve deficit.     Gait: Gait normal.  Psychiatric:        Mood and Affect: Mood normal.        Behavior: Behavior normal.        Thought Content: Thought content normal.        Judgment: Judgment normal.    LABORATORY DATA: I have personally reviewed the data as listed:  No visits with results within 1 Month(s) from this visit.  Latest known visit with results is:  Procedure visit on 08/18/2023  Component Date Value Ref Range Status   SURGICAL PATHOLOGY 08/18/2023    Final-Edited                   Value:SURGICAL PATHOLOGY [facility] [address] [place], [place] [address] Telephone [phone] or [phone] Fax [phone]  REPORT OF SURGICAL  PATHOLOGY   Accession #: [id number] Patient Name: JILLIANN, ILLE Visit # : [id number]  MRN: [id number] Physician: Claudette Head DOB/Age 08/17/1966 (Age: 70) Gender: F Collected Date: 08/18/2023 Received Date: 08/20/2023  FINAL DIAGNOSIS       1. Surgical [P], colon, descending, sigmoid and transverse, polyp (4) :       - TUBULAR ADENOMA(S) (2 FRAGMENTS)      - SESSILE SERRATED POLYP WITHOUT CYTOLOGIC DYSPLASIA (1 FRAGMENT)      - HYPERPLASTIC POLYP (1 FRAGMENT)      - NEGATIVE FOR HIGH-GRADE DYSPLASIA OR MALIGNANCY       2. Surgical [P], colon, sigmoid erythema bx :       - BENIGN COLONIC MUCOSA WITH LYMPHOID AGGREGATE AND RARE, FOCAL NONSPECIFIC      HEMORRHAGE      - NEGATIVE FOR SIGNIFICANT INFLAMMATION, CHRONIC CHANGES, GRANULOMATA,      DYSPLASIA, OR MICROSCOPIC COLITIDES                                3. Surgical [P], duodenal bx :       - DUODENAL MUCOSA WITHOUT DIAGNOSTIC ABNORMALITY      - NEGATIVE  FOR CELIAC CHANGE       4. Surgical [P], fundus and gastric body polyps :       - FUNDIC GLAND POLYP(S) (MULTIPLE FRAGMENTS)      - NEGATIVE FOR DYSPLASIA OR MALIGNANCY       ELECTRONIC SIGNATURE : Kanteti M.D., Dossie Arbour., Pathologist, Electronic Signature  MICROSCOPIC DESCRIPTION  CASE COMMENTS STAINS USED IN DIAGNOSIS: H&E H&E H&E H&E H&E-2 H&E H&E-2 H&E-3 H&E H&E-2 H&E-3    CLINICAL HISTORY  SPECIMEN(S) OBTAINED 1. Surgical [P], Colon, Descending, Sigmoid And Transverse, Polyp (4) 2. Surgical [P], Colon, Sigmoid Erythema Bx 3. Surgical [P], Duodenal Bx 4. Surgical [P], Fundus And Gastric Body Polyps  SPECIMEN COMMENTS: 1. Iron deficiency anemia, unspecified iron deficiency anemia type; gastroesophageal reflux disease, unspecified whether esophagitis present; hx of adenomatous polyp of colon; family history of colon cancer; benign neo                         plasm of transverse colon; benign neoplasm of descending colon SPECIMEN CLINICAL  INFORMATION: 1. R/O adenoma 2. R/O other colitis 3. R/O celiac sprue 4. R/O other gastric fundic polyps    Gross Description 1. Received in formalin are tan, soft tissue fragments that are submitted in toto.Number: 4, Size: 0.3 cm smallest to 0.4 cm largest, (1B) ( TA ) 2. Received in formalin are tan, soft tissue fragments that are submitted in toto.Number: 1 Size: 0.3 cm, (1B) ( TA ) 3. Received in formalin are tan, soft tissue fragments that are submitted in toto.Number: 5, Size: 0.3 cm smallest to 0.5 cm largest, (1B) ( TA ) 4. Received in formalin are tan, soft tissue fragments that are submitted in toto.Number: mp, Size: 0.5 cm smallest to 1.3 cm largest, (3B) A,B,C= mp, ( TA )        Report signed out from the following location(s) . [facility] [address], [place] [address] CLIA #: [id number]  [facility] [address]                         [place], [place] [address] CLIA #: [id number]     RADIOGRAPHIC STUDIES: I have personally reviewed the radiological images as listed and agree with the findings in the report  No results found.  ASSESSMENT/PLAN   Patient is a 57 year old  female with symptomatic microcytic anemia presumed to be secondary to chronic blood loss.    Anemia:  Secondary to iron deficiency anemia from chronic GI blood loss.   July 20, 2023 through July 29, 2023: Received total of 1000 mg of Venofer  August 04 2003- Symptoms of fatigue and ice pica improved but not resolved.  Hgb 10.0  MCV 73  Ferritin 121    August 18 2023--  EGD--multiple 3 to 8 mm sessile polyps in gastric fundus and body.  8 polyps with stigmata of recent bleeding and several larger polyps removed.  Duodenal bulb and second portion of duodenum normal.   Colonoscopy--single medium size angioectasia without bleeding in ascending colon.  1 sessile polyp in sigmoid colon, 2 in transverse colon measuring 5 to 7 mm in size.  3 mm  polyp in ascending colon Pathology-- Fundic gland polyps (stomach) No dysplasia or malignancy.  Duodenal mucosa normal.  Negative for celiac disease.  Colon polyps adenomas without dysplasia    September 9 through August 31 1013- Received total of  1000 mg Venofer    October 04 2023: Heb 13.9 Ferritin 200 Folate 4.9 B12 341.  Begin MVI with iron and additional dose of folic acid.     Personal history of colon polyps and family history of ovarian cancer  July 05 2023- Refer to genetics for consideration of testing  September 10 2023: Patient cancelled genetic counseling appointment    Cancer Staging  No matching staging information was found for the patient.    No problem-specific Assessment & Plan notes found for this encounter.    No orders of the defined types were placed in this encounter.   30  minutes was spent in patient care.  This included time spent preparing to see the patient (e.g., review of tests), obtaining and/or reviewing separately obtained history, counseling and educating the patient/family/caregiver, ordering medications, tests, or procedures; documenting clinical information in the electronic or other health record, independently interpreting results and communicating results to the patient/family/caregiver as well as coordination of care.       All questions were answered. The patient knows to call the clinic with any problems, questions or concerns.  This note was electronically signed.    Loni Muse, MD  10/04/2023 8:40 AM

## 2023-10-07 ENCOUNTER — Other Ambulatory Visit: Payer: Self-pay

## 2023-10-07 MED ORDER — FOLIC ACID 1 MG PO TABS: 1.0000 mg | ORAL_TABLET | Freq: Every day | ORAL | Status: AC

## 2023-10-08 ENCOUNTER — Other Ambulatory Visit (HOSPITAL_COMMUNITY): Payer: Self-pay

## 2023-10-08 MED ORDER — ESOMEPRAZOLE MAGNESIUM 40 MG PO CPDR
40.0000 mg | DELAYED_RELEASE_CAPSULE | Freq: Every day | ORAL | 1 refills | Status: DC
  Filled 2023-10-08: qty 90, 90d supply, fill #0
  Filled 2024-01-11: qty 90, 90d supply, fill #1

## 2023-10-08 MED ORDER — CLONIDINE HCL 0.2 MG PO TABS
0.2000 mg | ORAL_TABLET | Freq: Two times a day (BID) | ORAL | 1 refills | Status: DC
  Filled 2023-10-08: qty 180, 90d supply, fill #0
  Filled 2024-01-11: qty 180, 90d supply, fill #1

## 2023-10-09 ENCOUNTER — Other Ambulatory Visit (HOSPITAL_COMMUNITY): Payer: Self-pay

## 2023-10-13 ENCOUNTER — Encounter: Payer: Self-pay | Admitting: Oncology

## 2023-10-13 DIAGNOSIS — K317 Polyp of stomach and duodenum: Secondary | ICD-10-CM | POA: Insufficient documentation

## 2023-10-13 DIAGNOSIS — E538 Deficiency of other specified B group vitamins: Secondary | ICD-10-CM | POA: Insufficient documentation

## 2023-10-16 ENCOUNTER — Other Ambulatory Visit (HOSPITAL_COMMUNITY): Payer: Self-pay

## 2023-10-21 ENCOUNTER — Other Ambulatory Visit (HOSPITAL_COMMUNITY): Payer: Self-pay

## 2023-10-22 ENCOUNTER — Other Ambulatory Visit (HOSPITAL_COMMUNITY): Payer: Self-pay

## 2023-10-25 ENCOUNTER — Other Ambulatory Visit (HOSPITAL_COMMUNITY): Payer: Self-pay

## 2023-10-26 ENCOUNTER — Other Ambulatory Visit (HOSPITAL_COMMUNITY): Payer: Self-pay

## 2023-10-26 MED ORDER — JARDIANCE 25 MG PO TABS
25.0000 mg | ORAL_TABLET | Freq: Every day | ORAL | 0 refills | Status: DC
  Filled 2023-10-26: qty 90, 90d supply, fill #0

## 2023-10-26 MED ORDER — TEMAZEPAM 30 MG PO CAPS
30.0000 mg | ORAL_CAPSULE | Freq: Every evening | ORAL | 0 refills | Status: DC | PRN
  Filled 2023-10-26: qty 90, 90d supply, fill #0

## 2023-10-27 ENCOUNTER — Encounter: Payer: Self-pay | Admitting: Oncology

## 2023-10-27 ENCOUNTER — Other Ambulatory Visit (HOSPITAL_COMMUNITY): Payer: Self-pay

## 2023-10-27 MED ORDER — BUTALBITAL-APAP-CAFFEINE 50-325-40 MG PO TABS
1.0000 | ORAL_TABLET | Freq: Four times a day (QID) | ORAL | 0 refills | Status: DC | PRN
  Filled 2023-10-27 – 2023-12-01 (×2): qty 60, 15d supply, fill #0

## 2023-10-27 MED ORDER — BUTALBITAL-APAP-CAFFEINE 50-325-40 MG PO TABS
1.0000 | ORAL_TABLET | Freq: Four times a day (QID) | ORAL | 0 refills | Status: AC | PRN
  Filled 2023-10-27: qty 60, 30d supply, fill #0

## 2023-10-28 ENCOUNTER — Other Ambulatory Visit (HOSPITAL_COMMUNITY): Payer: Self-pay

## 2023-10-29 ENCOUNTER — Other Ambulatory Visit (HOSPITAL_COMMUNITY): Payer: Self-pay

## 2023-10-29 MED ORDER — IRBESARTAN 300 MG PO TABS
300.0000 mg | ORAL_TABLET | Freq: Every day | ORAL | 0 refills | Status: DC
  Filled 2023-10-29: qty 90, 90d supply, fill #0

## 2023-11-02 ENCOUNTER — Other Ambulatory Visit (HOSPITAL_COMMUNITY): Payer: Self-pay

## 2023-11-08 ENCOUNTER — Other Ambulatory Visit (HOSPITAL_COMMUNITY): Payer: Self-pay

## 2023-11-10 ENCOUNTER — Other Ambulatory Visit (HOSPITAL_COMMUNITY): Payer: Self-pay

## 2023-11-10 MED ORDER — CLONAZEPAM 1 MG PO TABS
1.0000 mg | ORAL_TABLET | Freq: Two times a day (BID) | ORAL | 0 refills | Status: DC | PRN
  Filled 2023-11-10: qty 60, 30d supply, fill #0

## 2023-11-15 ENCOUNTER — Other Ambulatory Visit (HOSPITAL_COMMUNITY): Payer: Self-pay

## 2023-11-30 ENCOUNTER — Other Ambulatory Visit (HOSPITAL_COMMUNITY): Payer: Self-pay

## 2023-12-01 ENCOUNTER — Other Ambulatory Visit (HOSPITAL_COMMUNITY): Payer: Self-pay

## 2023-12-01 MED ORDER — BUTALBITAL-APAP-CAFFEINE 50-325-40 MG PO TABS
1.0000 | ORAL_TABLET | Freq: Four times a day (QID) | ORAL | 0 refills | Status: DC | PRN
  Filled 2024-01-03: qty 60, 15d supply, fill #0

## 2023-12-02 ENCOUNTER — Other Ambulatory Visit (HOSPITAL_COMMUNITY): Payer: Self-pay

## 2023-12-13 ENCOUNTER — Other Ambulatory Visit (HOSPITAL_COMMUNITY): Payer: Self-pay

## 2023-12-13 MED ORDER — CLONAZEPAM 1 MG PO TABS
1.0000 mg | ORAL_TABLET | Freq: Two times a day (BID) | ORAL | 0 refills | Status: DC | PRN
  Filled 2023-12-13: qty 60, 30d supply, fill #0

## 2023-12-14 ENCOUNTER — Other Ambulatory Visit (HOSPITAL_COMMUNITY): Payer: Self-pay

## 2023-12-16 ENCOUNTER — Other Ambulatory Visit (HOSPITAL_COMMUNITY): Payer: Self-pay

## 2023-12-17 ENCOUNTER — Other Ambulatory Visit (HOSPITAL_COMMUNITY): Payer: Self-pay

## 2023-12-17 MED ORDER — PROMETHAZINE HCL 25 MG PO TABS
25.0000 mg | ORAL_TABLET | Freq: Two times a day (BID) | ORAL | 0 refills | Status: DC
  Filled 2023-12-17: qty 60, 30d supply, fill #0

## 2023-12-18 ENCOUNTER — Other Ambulatory Visit (HOSPITAL_COMMUNITY): Payer: Self-pay

## 2023-12-24 ENCOUNTER — Other Ambulatory Visit (HOSPITAL_COMMUNITY): Payer: Self-pay

## 2023-12-24 MED ORDER — BENZONATATE 200 MG PO CAPS
200.0000 mg | ORAL_CAPSULE | Freq: Three times a day (TID) | ORAL | 0 refills | Status: AC
  Filled 2023-12-24: qty 15, 5d supply, fill #0

## 2023-12-29 ENCOUNTER — Other Ambulatory Visit (HOSPITAL_COMMUNITY): Payer: Self-pay

## 2024-01-03 ENCOUNTER — Other Ambulatory Visit (HOSPITAL_COMMUNITY): Payer: Self-pay

## 2024-01-03 ENCOUNTER — Other Ambulatory Visit: Payer: Self-pay | Admitting: Hematology and Oncology

## 2024-01-03 DIAGNOSIS — E538 Deficiency of other specified B group vitamins: Secondary | ICD-10-CM

## 2024-01-03 DIAGNOSIS — D500 Iron deficiency anemia secondary to blood loss (chronic): Secondary | ICD-10-CM

## 2024-01-03 NOTE — Progress Notes (Unsigned)
[facility]  [address] [place],  [place]  [address] [phone]  Clinic Day:  01/04/2024  Referring physician: Noberto Retort, MD   HISTORY OF PRESENT ILLNESS:  The patient is a 58 y.o. female with iron deficiency anemia.  She received IV iron in August and September.  EGD and colonoscopy in September 2024 revealed gastric AVMs for which she had fulguration. She is her today for repeat clinical assessment. She denies progressive fatigue. She denies and overt form of blood loss. She states she is not taking any vitamin supplements.  PHYSICAL EXAM:  Blood pressure 90/66, pulse 64, temperature 97.7 F (36.5 C), temperature source Oral, resp. rate 20, height 5\' 7"  (1.702 m), weight 219 lb 12.8 oz (99.7 kg), SpO2 96%. Wt Readings from Last 3 Encounters:  01/04/24 219 lb 12.8 oz (99.7 kg)  10/04/23 225 lb (102.1 kg)  08/30/23 227 lb 1.3 oz (103 kg)   Body mass index is 34.43 kg/m.  Performance status (ECOG): 0 - Asymptomatic  Physical Exam Vitals and nursing note reviewed.  Constitutional:      General: She is not in acute distress.    Appearance: Normal appearance. She is obese. She is not ill-appearing.  HENT:     Head: Normocephalic and atraumatic.     Mouth/Throat:     Mouth: Mucous membranes are moist.     Pharynx: Oropharynx is clear. No oropharyngeal exudate or posterior oropharyngeal erythema.  Eyes:     General: No scleral icterus.    Extraocular Movements: Extraocular movements intact.     Conjunctiva/sclera: Conjunctivae normal.     Pupils: Pupils are equal, round, and reactive to light.  Cardiovascular:     Rate and Rhythm: Normal rate and regular rhythm.     Heart sounds: Normal heart sounds. No murmur heard.    No friction rub. No gallop.  Pulmonary:     Effort: Pulmonary effort is normal.     Breath sounds: Normal breath sounds. No wheezing, rhonchi or rales.  Abdominal:     General: There is no distension.     Palpations: Abdomen  is soft. There is no hepatomegaly, splenomegaly or mass.     Tenderness: There is no abdominal tenderness.  Musculoskeletal:        General: Normal range of motion.     Cervical back: Normal range of motion and neck supple. No tenderness.     Right lower leg: No edema.     Left lower leg: No edema.  Lymphadenopathy:     Cervical: No cervical adenopathy.     Upper Body:     Right upper body: No supraclavicular or axillary adenopathy.     Left upper body: No supraclavicular or axillary adenopathy.     Lower Body: No right inguinal adenopathy. No left inguinal adenopathy.  Skin:    General: Skin is warm and dry.     Coloration: Skin is not jaundiced.     Findings: No rash.  Neurological:     Mental Status: She is alert and oriented to person, place, and time.     Cranial Nerves: No cranial nerve deficit.  Psychiatric:        Mood and Affect: Mood normal.        Behavior: Behavior normal.        Thought Content: Thought content normal.     LABS:      Latest Ref Rng & Units 01/04/2024    3:05 PM 10/04/2023  10:27 AM 08/04/2023    9:17 AM  CBC  WBC 4.0 - 10.5 K/uL 8.8  8.1  5.8   Hemoglobin 12.0 - 15.0 g/dL 16.1  09.6  04.5   Hematocrit 36.0 - 46.0 % 41.6  45.2  35.9   Platelets 150 - 400 K/uL 258  291  289       Latest Ref Rng & Units 06/16/2023    3:16 PM 04/28/2017    6:46 AM 04/27/2017    6:33 AM  CMP  Glucose 70 - 99 mg/dL 409  811  914   BUN 6 - 23 mg/dL 14  7  8    Creatinine 0.40 - 1.20 mg/dL 7.82  9.56  2.13   Sodium 135 - 145 mEq/L 138  139  142   Potassium 3.5 - 5.1 mEq/L 3.6  3.8  3.4   Chloride 96 - 112 mEq/L 101  105  111   CO2 19 - 32 mEq/L 28  24  23    Calcium 8.4 - 10.5 mg/dL 9.8  8.7  8.8   Total Protein 6.0 - 8.3 g/dL 7.0  6.5    Total Bilirubin 0.2 - 1.2 mg/dL 0.3  0.2    Alkaline Phos 39 - 117 U/L 80  71    AST 0 - 37 U/L 19  38    ALT 0 - 35 U/L 23  56      Latest Reference Range & Units 01/04/24 15:05  Iron 28 - 170 ug/dL 56  UIBC ug/dL 086   TIBC 578 - 469 ug/dL 629  Saturation Ratios 10.4 - 31.8 % 16  Ferritin 11 - 307 ng/mL 103  Folate >5.9 ng/mL 9.1  Vitamin B12 180 - 914 pg/mL 309     Lab Results  Component Value Date   TIBC 525.0 (H) 06/16/2023   TIBC 343 04/27/2017   FERRITIN 200 10/04/2023   FERRITIN 121 08/04/2023   FERRITIN 2.9 (L) 06/16/2023   IRONPCTSAT 3.6 (L) 06/16/2023   IRONPCTSAT 16 04/27/2017   Lab Results  Component Value Date   LDH 217 (H) 04/27/2017       Component Value Date/Time   LDH 217 (H) 04/27/2017 0940    Review Flowsheet  More data exists      Latest Ref Rng & Units 06/16/2023 08/04/2023 10/04/2023  Oncology Labs  Ferritin 11 - 307 ng/mL 2.9  121  200   %SAT 20.0 - 50.0 % 3.6  - -     STUDIES:  No results found.    ASSESSMENT & PLAN:   Assessment/Plan:  58 y.o. female with iron deficiency anemia.  Her hemoglobin remains normal and iron stores in good range.  I will plan to see her back in 3 months for repeat clinical assessment.  The patient understands all the plans discussed today and is in agreement with them.  She knows to contact our office if she develops concerns prior to her next appointment.     Adah Perl, PA-C   Physician Assistant [facility] [place] [phone]

## 2024-01-04 ENCOUNTER — Encounter: Payer: Self-pay | Admitting: Hematology and Oncology

## 2024-01-04 ENCOUNTER — Inpatient Hospital Stay: Payer: Commercial Managed Care - PPO

## 2024-01-04 ENCOUNTER — Ambulatory Visit: Payer: Commercial Managed Care - PPO | Admitting: Oncology

## 2024-01-04 ENCOUNTER — Inpatient Hospital Stay: Payer: Commercial Managed Care - PPO | Attending: Oncology | Admitting: Hematology and Oncology

## 2024-01-04 ENCOUNTER — Other Ambulatory Visit: Payer: Commercial Managed Care - PPO

## 2024-01-04 VITALS — BP 90/66 | HR 64 | Temp 97.7°F | Resp 20 | Ht 67.0 in | Wt 219.8 lb

## 2024-01-04 DIAGNOSIS — D509 Iron deficiency anemia, unspecified: Secondary | ICD-10-CM | POA: Diagnosis not present

## 2024-01-04 DIAGNOSIS — E538 Deficiency of other specified B group vitamins: Secondary | ICD-10-CM

## 2024-01-04 DIAGNOSIS — D500 Iron deficiency anemia secondary to blood loss (chronic): Secondary | ICD-10-CM | POA: Diagnosis not present

## 2024-01-04 LAB — CBC WITH DIFFERENTIAL (CANCER CENTER ONLY)
Abs Immature Granulocytes: 0.04 10*3/uL (ref 0.00–0.07)
Basophils Absolute: 0.1 10*3/uL (ref 0.0–0.1)
Basophils Relative: 1 %
Eosinophils Absolute: 0.2 10*3/uL (ref 0.0–0.5)
Eosinophils Relative: 2 %
HCT: 41.6 % (ref 36.0–46.0)
Hemoglobin: 13.8 g/dL (ref 12.0–15.0)
Immature Granulocytes: 1 %
Lymphocytes Relative: 34 %
Lymphs Abs: 3 10*3/uL (ref 0.7–4.0)
MCH: 29.4 pg (ref 26.0–34.0)
MCHC: 33.2 g/dL (ref 30.0–36.0)
MCV: 88.7 fL (ref 80.0–100.0)
Monocytes Absolute: 0.4 10*3/uL (ref 0.1–1.0)
Monocytes Relative: 5 %
Neutro Abs: 5.1 10*3/uL (ref 1.7–7.7)
Neutrophils Relative %: 57 %
Platelet Count: 258 10*3/uL (ref 150–400)
RBC: 4.69 MIL/uL (ref 3.87–5.11)
RDW: 14.9 % (ref 11.5–15.5)
WBC Count: 8.8 10*3/uL (ref 4.0–10.5)
nRBC: 0 % (ref 0.0–0.2)
nRBC: 0 /100{WBCs}

## 2024-01-04 LAB — FOLATE: Folate: 9.1 ng/mL (ref >5.9)

## 2024-01-04 LAB — IRON AND TIBC
Iron: 56 ug/dL (ref 28–170)
Saturation Ratios: 16 % (ref 10.4–31.8)
TIBC: 344 ug/dL (ref 250–450)
UIBC: 288 ug/dL

## 2024-01-04 LAB — VITAMIN B12: Vitamin B-12: 309 pg/mL (ref 180–914)

## 2024-01-04 LAB — FERRITIN: Ferritin: 103 ng/mL (ref 11–307)

## 2024-01-05 ENCOUNTER — Telehealth: Payer: Self-pay

## 2024-01-05 ENCOUNTER — Encounter: Payer: Self-pay | Admitting: Oncology

## 2024-01-05 NOTE — Telephone Encounter (Signed)
Patient notified and voiced understanding.

## 2024-01-05 NOTE — Telephone Encounter (Signed)
-----   Message from Adah Perl sent at 01/05/2024 10:25 AM EST ----- Please let her know all her vitamin levels are normal. Keep appt in 3 months. Thanks

## 2024-01-11 ENCOUNTER — Other Ambulatory Visit (HOSPITAL_COMMUNITY): Payer: Self-pay

## 2024-01-12 ENCOUNTER — Other Ambulatory Visit (HOSPITAL_COMMUNITY): Payer: Self-pay

## 2024-01-14 ENCOUNTER — Other Ambulatory Visit (HOSPITAL_COMMUNITY): Payer: Self-pay

## 2024-01-14 ENCOUNTER — Other Ambulatory Visit: Payer: Self-pay

## 2024-01-14 MED ORDER — BUPROPION HCL ER (XL) 300 MG PO TB24
300.0000 mg | ORAL_TABLET | Freq: Every day | ORAL | 0 refills | Status: DC
  Filled 2024-01-14: qty 30, 30d supply, fill #0

## 2024-01-14 MED ORDER — BACLOFEN 10 MG PO TABS
10.0000 mg | ORAL_TABLET | Freq: Three times a day (TID) | ORAL | 0 refills | Status: DC
  Filled 2024-01-14 (×2): qty 90, 30d supply, fill #0

## 2024-01-14 MED ORDER — CLONAZEPAM 1 MG PO TABS
1.0000 mg | ORAL_TABLET | Freq: Two times a day (BID) | ORAL | 0 refills | Status: DC | PRN
  Filled 2024-01-14: qty 60, 30d supply, fill #0

## 2024-02-04 ENCOUNTER — Other Ambulatory Visit (HOSPITAL_COMMUNITY): Payer: Self-pay

## 2024-02-05 ENCOUNTER — Other Ambulatory Visit (HOSPITAL_COMMUNITY): Payer: Self-pay

## 2024-02-07 ENCOUNTER — Other Ambulatory Visit (HOSPITAL_COMMUNITY): Payer: Self-pay

## 2024-02-09 ENCOUNTER — Other Ambulatory Visit: Payer: Self-pay

## 2024-02-09 ENCOUNTER — Other Ambulatory Visit (HOSPITAL_COMMUNITY): Payer: Self-pay

## 2024-02-09 MED ORDER — JARDIANCE 25 MG PO TABS
25.0000 mg | ORAL_TABLET | Freq: Every day | ORAL | 0 refills | Status: AC
  Filled 2024-02-09: qty 90, 90d supply, fill #0

## 2024-02-09 MED ORDER — IRBESARTAN 300 MG PO TABS
300.0000 mg | ORAL_TABLET | Freq: Every day | ORAL | 1 refills | Status: DC
  Filled 2024-02-09 – 2024-05-09 (×2): qty 90, 90d supply, fill #0
  Filled 2024-08-07: qty 90, 90d supply, fill #1

## 2024-02-09 MED ORDER — IRBESARTAN 300 MG PO TABS
300.0000 mg | ORAL_TABLET | Freq: Every day | ORAL | 0 refills | Status: AC
  Filled 2024-02-09: qty 90, 90d supply, fill #0

## 2024-02-09 MED ORDER — TEMAZEPAM 30 MG PO CAPS
30.0000 mg | ORAL_CAPSULE | Freq: Every evening | ORAL | 1 refills | Status: DC | PRN
  Filled 2024-02-09: qty 90, 90d supply, fill #0
  Filled 2024-05-15: qty 90, 90d supply, fill #1

## 2024-02-09 MED ORDER — JARDIANCE 25 MG PO TABS
25.0000 mg | ORAL_TABLET | Freq: Every day | ORAL | 1 refills | Status: DC
  Filled 2024-02-09 – 2024-05-09 (×2): qty 90, 90d supply, fill #0
  Filled 2024-08-07: qty 90, 90d supply, fill #1

## 2024-02-10 ENCOUNTER — Other Ambulatory Visit (HOSPITAL_COMMUNITY): Payer: Self-pay

## 2024-02-10 DIAGNOSIS — E119 Type 2 diabetes mellitus without complications: Secondary | ICD-10-CM | POA: Diagnosis not present

## 2024-02-10 DIAGNOSIS — K219 Gastro-esophageal reflux disease without esophagitis: Secondary | ICD-10-CM | POA: Diagnosis not present

## 2024-02-10 DIAGNOSIS — E7800 Pure hypercholesterolemia, unspecified: Secondary | ICD-10-CM | POA: Diagnosis not present

## 2024-02-10 DIAGNOSIS — G43009 Migraine without aura, not intractable, without status migrainosus: Secondary | ICD-10-CM | POA: Diagnosis not present

## 2024-02-10 DIAGNOSIS — F331 Major depressive disorder, recurrent, moderate: Secondary | ICD-10-CM | POA: Diagnosis not present

## 2024-02-10 DIAGNOSIS — F419 Anxiety disorder, unspecified: Secondary | ICD-10-CM | POA: Diagnosis not present

## 2024-02-10 DIAGNOSIS — D538 Other specified nutritional anemias: Secondary | ICD-10-CM | POA: Diagnosis not present

## 2024-02-10 DIAGNOSIS — M545 Low back pain, unspecified: Secondary | ICD-10-CM | POA: Diagnosis not present

## 2024-02-10 DIAGNOSIS — I10 Essential (primary) hypertension: Secondary | ICD-10-CM | POA: Diagnosis not present

## 2024-02-10 DIAGNOSIS — G8929 Other chronic pain: Secondary | ICD-10-CM | POA: Diagnosis not present

## 2024-02-10 MED ORDER — BUTALBITAL-APAP-CAFFEINE 50-325-40 MG PO TABS
1.0000 | ORAL_TABLET | ORAL | 5 refills | Status: AC | PRN
  Filled 2024-02-10: qty 60, 10d supply, fill #0
  Filled 2024-03-08: qty 60, 10d supply, fill #1
  Filled 2024-04-07: qty 60, 10d supply, fill #2
  Filled 2024-05-09: qty 60, 10d supply, fill #3
  Filled 2024-06-09: qty 60, 10d supply, fill #4
  Filled 2024-07-07: qty 60, 10d supply, fill #5

## 2024-02-16 ENCOUNTER — Other Ambulatory Visit (HOSPITAL_COMMUNITY): Payer: Self-pay

## 2024-02-17 ENCOUNTER — Other Ambulatory Visit (HOSPITAL_COMMUNITY): Payer: Self-pay

## 2024-02-17 MED ORDER — CLONAZEPAM 1 MG PO TABS
1.0000 mg | ORAL_TABLET | Freq: Two times a day (BID) | ORAL | 0 refills | Status: DC | PRN
  Filled 2024-02-17: qty 60, 30d supply, fill #0

## 2024-02-19 ENCOUNTER — Other Ambulatory Visit (HOSPITAL_COMMUNITY): Payer: Self-pay

## 2024-02-23 ENCOUNTER — Other Ambulatory Visit: Payer: Self-pay

## 2024-02-23 ENCOUNTER — Other Ambulatory Visit (HOSPITAL_COMMUNITY): Payer: Self-pay

## 2024-02-23 DIAGNOSIS — R112 Nausea with vomiting, unspecified: Secondary | ICD-10-CM | POA: Diagnosis not present

## 2024-02-23 DIAGNOSIS — M542 Cervicalgia: Secondary | ICD-10-CM | POA: Diagnosis not present

## 2024-02-23 DIAGNOSIS — M62838 Other muscle spasm: Secondary | ICD-10-CM | POA: Diagnosis not present

## 2024-02-23 DIAGNOSIS — G43901 Migraine, unspecified, not intractable, with status migrainosus: Secondary | ICD-10-CM | POA: Diagnosis not present

## 2024-02-23 MED ORDER — METHOCARBAMOL 750 MG PO TABS
750.0000 mg | ORAL_TABLET | Freq: Four times a day (QID) | ORAL | 0 refills | Status: AC | PRN
  Filled 2024-02-23 (×2): qty 120, 30d supply, fill #0

## 2024-03-08 ENCOUNTER — Other Ambulatory Visit (HOSPITAL_COMMUNITY): Payer: Self-pay

## 2024-03-08 MED ORDER — ESOMEPRAZOLE MAGNESIUM 40 MG PO CPDR
40.0000 mg | DELAYED_RELEASE_CAPSULE | Freq: Every day | ORAL | 1 refills | Status: DC
Start: 2024-03-08 — End: 2024-10-13
  Filled 2024-04-17: qty 90, 90d supply, fill #0
  Filled 2024-07-19: qty 90, 90d supply, fill #1

## 2024-03-08 MED ORDER — BACLOFEN 10 MG PO TABS
10.0000 mg | ORAL_TABLET | Freq: Three times a day (TID) | ORAL | 0 refills | Status: DC
  Filled 2024-03-08: qty 90, 30d supply, fill #0

## 2024-03-08 MED ORDER — BUPROPION HCL ER (XL) 300 MG PO TB24
300.0000 mg | ORAL_TABLET | Freq: Every day | ORAL | 1 refills | Status: DC
  Filled 2024-03-08: qty 90, 90d supply, fill #0
  Filled 2024-06-09: qty 90, 90d supply, fill #1

## 2024-03-08 MED ORDER — CLONIDINE HCL 0.2 MG PO TABS
0.2000 mg | ORAL_TABLET | Freq: Two times a day (BID) | ORAL | 1 refills | Status: DC
  Filled 2024-05-09: qty 180, 90d supply, fill #0
  Filled 2024-08-07: qty 180, 90d supply, fill #1

## 2024-03-08 MED ORDER — AMLODIPINE BESYLATE 10 MG PO TABS
10.0000 mg | ORAL_TABLET | Freq: Every day | ORAL | 1 refills | Status: DC
  Filled 2024-03-22: qty 90, 90d supply, fill #0
  Filled 2024-06-22: qty 90, 90d supply, fill #1

## 2024-03-08 MED ORDER — PROMETHAZINE HCL 25 MG PO TABS
25.0000 mg | ORAL_TABLET | Freq: Two times a day (BID) | ORAL | 0 refills | Status: DC
  Filled 2024-03-08: qty 60, 30d supply, fill #0

## 2024-03-22 ENCOUNTER — Other Ambulatory Visit (HOSPITAL_COMMUNITY): Payer: Self-pay

## 2024-03-24 ENCOUNTER — Other Ambulatory Visit (HOSPITAL_COMMUNITY): Payer: Self-pay

## 2024-03-24 MED ORDER — CLONAZEPAM 1 MG PO TABS
1.0000 mg | ORAL_TABLET | Freq: Two times a day (BID) | ORAL | 0 refills | Status: AC | PRN
  Filled 2024-03-24: qty 60, 30d supply, fill #0

## 2024-03-25 ENCOUNTER — Other Ambulatory Visit (HOSPITAL_COMMUNITY): Payer: Self-pay

## 2024-04-03 NOTE — Progress Notes (Deleted)
 [facility]  [address] [place],  [place]  [address] [phone]  Clinic Day:  04/03/2024  Referring physician: Roselind Congo, MD   HISTORY OF PRESENT ILLNESS:  The patient is a 58 y.o. female with iron  deficiency anemia who we began seeing in July 2024. She did not tolerate oral iron  supplementation.  She received IV iron  in August and September 2024.  EGD and colonoscopy in September 2024 revealed gastric AVMs for which she had fulguration. She is her today for repeat clinical assessment. She denies progressive fatigue concerning for recurrent anemia. She denies and overt form of blood loss.     PHYSICAL EXAM:  There were no vitals taken for this visit. Wt Readings from Last 3 Encounters:  01/04/24 219 lb 12.8 oz (99.7 kg)  10/04/23 225 lb (102.1 kg)  08/30/23 227 lb 1.3 oz (103 kg)   There is no height or weight on file to calculate BMI.  Performance status (ECOG): {CHL ONC [id number]  Physical Exam  LABS:      Latest Ref Rng & Units 01/04/2024    3:05 PM 10/04/2023   10:27 AM 08/04/2023    9:17 AM  CBC  WBC 4.0 - 10.5 K/uL 8.8  8.1  5.8   Hemoglobin 12.0 - 15.0 g/dL 91.4  78.2  95.6   Hematocrit 36.0 - 46.0 % 41.6  45.2  35.9   Platelets 150 - 400 K/uL 258  291  289       Latest Ref Rng & Units 06/16/2023    3:16 PM 04/28/2017    6:46 AM 04/27/2017    6:33 AM  CMP  Glucose 70 - 99 mg/dL 213  086  578   BUN 6 - 23 mg/dL 14  7  8    Creatinine 0.40 - 1.20 mg/dL 4.69  6.29  5.28   Sodium 135 - 145 mEq/L 138  139  142   Potassium 3.5 - 5.1 mEq/L 3.6  3.8  3.4   Chloride 96 - 112 mEq/L 101  105  111   CO2 19 - 32 mEq/L 28  24  23    Calcium  8.4 - 10.5 mg/dL 9.8  8.7  8.8   Total Protein 6.0 - 8.3 g/dL 7.0  6.5    Total Bilirubin 0.2 - 1.2 mg/dL 0.3  0.2    Alkaline Phos 39 - 117 U/L 80  71    AST 0 - 37 U/L 19  38    ALT 0 - 35 U/L 23  56       No results found for: "CEA1", "CEA" / No results found for: "CEA1", "CEA" No results  found for: "PSA1" No results found for: "UXL244" No results found for: "CAN125"  No results found for: "TOTALPROTELP", "ALBUMINELP", "A1GS", "A2GS", "BETS", "BETA2SER", "GAMS", "MSPIKE", "SPEI" Lab Results  Component Value Date   TIBC 344 01/04/2024   TIBC 525.0 (H) 06/16/2023   TIBC 343 04/27/2017   FERRITIN 103 01/04/2024   FERRITIN 200 10/04/2023   FERRITIN 121 08/04/2023   IRONPCTSAT 16 01/04/2024   IRONPCTSAT 3.6 (L) 06/16/2023   IRONPCTSAT 16 04/27/2017   Lab Results  Component Value Date   LDH 217 (H) 04/27/2017       Component Value Date/Time   LDH 217 (H) 04/27/2017 0940    Review Flowsheet  More data exists      Latest Ref Rng & Units 08/04/2023 10/04/2023 01/04/2024  Oncology Labs  Ferritin 11 - 307 ng/mL 121  200  103   %SAT 10.4 - 31.8 % - - 16      STUDIES:  No results found.    ASSESSMENT & PLAN:   Assessment/Plan:  58 y.o. female with ***  The patient understands all the plans discussed today and is in agreement with them.  She knows to contact our office if she develops concerns prior to her next appointment.     Alfonso Ike, PA-C   Physician Assistant [facility] [place] [phone]

## 2024-04-04 ENCOUNTER — Inpatient Hospital Stay: Payer: Commercial Managed Care - PPO | Attending: Oncology | Admitting: Hematology and Oncology

## 2024-04-04 ENCOUNTER — Inpatient Hospital Stay: Payer: Commercial Managed Care - PPO

## 2024-04-07 ENCOUNTER — Other Ambulatory Visit (HOSPITAL_COMMUNITY): Payer: Self-pay

## 2024-04-07 MED ORDER — CLOTRIMAZOLE-BETAMETHASONE 1-0.05 % EX CREA
1.0000 | TOPICAL_CREAM | Freq: Two times a day (BID) | CUTANEOUS | 1 refills | Status: AC
  Filled 2024-04-07: qty 15, 14d supply, fill #0

## 2024-04-17 ENCOUNTER — Other Ambulatory Visit (HOSPITAL_COMMUNITY): Payer: Self-pay

## 2024-04-18 ENCOUNTER — Other Ambulatory Visit (HOSPITAL_COMMUNITY): Payer: Self-pay

## 2024-04-18 MED ORDER — BACLOFEN 10 MG PO TABS
10.0000 mg | ORAL_TABLET | Freq: Three times a day (TID) | ORAL | 0 refills | Status: DC
  Filled 2024-04-18: qty 90, 30d supply, fill #0

## 2024-04-19 ENCOUNTER — Other Ambulatory Visit (HOSPITAL_COMMUNITY): Payer: Self-pay

## 2024-04-24 ENCOUNTER — Other Ambulatory Visit (HOSPITAL_COMMUNITY): Payer: Self-pay

## 2024-04-27 ENCOUNTER — Other Ambulatory Visit (HOSPITAL_COMMUNITY): Payer: Self-pay

## 2024-04-27 MED ORDER — CLONAZEPAM 1 MG PO TABS
1.0000 mg | ORAL_TABLET | Freq: Two times a day (BID) | ORAL | 0 refills | Status: AC | PRN
  Filled 2024-04-27: qty 60, 30d supply, fill #0

## 2024-05-09 ENCOUNTER — Other Ambulatory Visit (HOSPITAL_COMMUNITY): Payer: Self-pay

## 2024-05-10 ENCOUNTER — Other Ambulatory Visit (HOSPITAL_COMMUNITY): Payer: Self-pay

## 2024-05-10 ENCOUNTER — Other Ambulatory Visit: Payer: Self-pay

## 2024-05-11 ENCOUNTER — Other Ambulatory Visit (HOSPITAL_COMMUNITY): Payer: Self-pay

## 2024-05-11 MED ORDER — PROMETHAZINE HCL 25 MG PO TABS
25.0000 mg | ORAL_TABLET | Freq: Two times a day (BID) | ORAL | 0 refills | Status: DC
  Filled 2024-05-11: qty 60, 30d supply, fill #0

## 2024-05-15 ENCOUNTER — Other Ambulatory Visit (HOSPITAL_COMMUNITY): Payer: Self-pay

## 2024-05-18 DIAGNOSIS — H04123 Dry eye syndrome of bilateral lacrimal glands: Secondary | ICD-10-CM | POA: Diagnosis not present

## 2024-05-18 DIAGNOSIS — H5213 Myopia, bilateral: Secondary | ICD-10-CM | POA: Diagnosis not present

## 2024-05-19 ENCOUNTER — Other Ambulatory Visit (HOSPITAL_COMMUNITY): Payer: Self-pay

## 2024-05-20 ENCOUNTER — Other Ambulatory Visit (HOSPITAL_COMMUNITY): Payer: Self-pay

## 2024-05-23 ENCOUNTER — Other Ambulatory Visit (HOSPITAL_COMMUNITY): Payer: Self-pay

## 2024-05-24 ENCOUNTER — Other Ambulatory Visit (HOSPITAL_COMMUNITY): Payer: Self-pay

## 2024-05-24 MED ORDER — BACLOFEN 10 MG PO TABS
ORAL_TABLET | ORAL | 0 refills | Status: AC
  Filled 2024-05-24: qty 90, 30d supply, fill #0

## 2024-05-24 MED ORDER — BACLOFEN 10 MG PO TABS
10.0000 mg | ORAL_TABLET | Freq: Three times a day (TID) | ORAL | 5 refills | Status: DC
  Filled 2024-05-24: qty 90, 90d supply, fill #0
  Filled 2024-06-22: qty 90, 30d supply, fill #0
  Filled 2024-07-19: qty 90, 30d supply, fill #1
  Filled 2024-08-18: qty 90, 30d supply, fill #2
  Filled 2024-09-20: qty 90, 30d supply, fill #3
  Filled 2024-10-16: qty 90, 30d supply, fill #4
  Filled 2024-11-15: qty 90, 30d supply, fill #5

## 2024-05-25 ENCOUNTER — Other Ambulatory Visit (HOSPITAL_COMMUNITY): Payer: Self-pay

## 2024-05-26 ENCOUNTER — Other Ambulatory Visit (HOSPITAL_COMMUNITY): Payer: Self-pay

## 2024-05-30 ENCOUNTER — Other Ambulatory Visit (HOSPITAL_COMMUNITY): Payer: Self-pay

## 2024-06-01 ENCOUNTER — Other Ambulatory Visit (HOSPITAL_COMMUNITY): Payer: Self-pay

## 2024-06-01 MED ORDER — CLONAZEPAM 1 MG PO TABS
1.0000 mg | ORAL_TABLET | Freq: Two times a day (BID) | ORAL | 3 refills | Status: AC
  Filled 2024-06-01: qty 60, 30d supply, fill #0
  Filled 2024-07-07: qty 60, 30d supply, fill #1
  Filled 2024-08-15: qty 60, 30d supply, fill #2
  Filled 2024-09-13: qty 60, 30d supply, fill #3

## 2024-06-02 ENCOUNTER — Other Ambulatory Visit (HOSPITAL_COMMUNITY): Payer: Self-pay

## 2024-06-08 ENCOUNTER — Other Ambulatory Visit (HOSPITAL_COMMUNITY): Payer: Self-pay

## 2024-06-09 ENCOUNTER — Other Ambulatory Visit (HOSPITAL_COMMUNITY): Payer: Self-pay

## 2024-06-22 ENCOUNTER — Other Ambulatory Visit (HOSPITAL_COMMUNITY): Payer: Self-pay

## 2024-06-23 ENCOUNTER — Other Ambulatory Visit (HOSPITAL_COMMUNITY): Payer: Self-pay

## 2024-06-23 MED ORDER — PROMETHAZINE HCL 25 MG PO TABS
25.0000 mg | ORAL_TABLET | Freq: Two times a day (BID) | ORAL | 0 refills | Status: DC
  Filled 2024-06-23: qty 60, 30d supply, fill #0

## 2024-06-24 ENCOUNTER — Other Ambulatory Visit (HOSPITAL_COMMUNITY): Payer: Self-pay

## 2024-07-07 ENCOUNTER — Other Ambulatory Visit (HOSPITAL_COMMUNITY): Payer: Self-pay

## 2024-07-19 ENCOUNTER — Other Ambulatory Visit (HOSPITAL_COMMUNITY): Payer: Self-pay

## 2024-08-07 ENCOUNTER — Other Ambulatory Visit (HOSPITAL_COMMUNITY): Payer: Self-pay

## 2024-08-08 ENCOUNTER — Other Ambulatory Visit (HOSPITAL_COMMUNITY): Payer: Self-pay

## 2024-08-09 ENCOUNTER — Other Ambulatory Visit (HOSPITAL_COMMUNITY): Payer: Self-pay

## 2024-08-09 MED ORDER — ESCITALOPRAM OXALATE 20 MG PO TABS
20.0000 mg | ORAL_TABLET | Freq: Every day | ORAL | 0 refills | Status: DC
  Filled 2024-08-09: qty 90, 90d supply, fill #0

## 2024-08-10 ENCOUNTER — Other Ambulatory Visit (HOSPITAL_COMMUNITY): Payer: Self-pay

## 2024-08-11 ENCOUNTER — Other Ambulatory Visit (HOSPITAL_COMMUNITY): Payer: Self-pay

## 2024-08-15 ENCOUNTER — Other Ambulatory Visit: Payer: Self-pay

## 2024-08-15 ENCOUNTER — Other Ambulatory Visit (HOSPITAL_COMMUNITY): Payer: Self-pay

## 2024-08-15 MED ORDER — BUTALBITAL-APAP-CAFFEINE 50-325-40 MG PO TABS
1.0000 | ORAL_TABLET | ORAL | 1 refills | Status: DC
  Filled 2024-08-15: qty 60, 10d supply, fill #0
  Filled 2024-09-13: qty 60, 10d supply, fill #1

## 2024-08-17 ENCOUNTER — Other Ambulatory Visit (HOSPITAL_COMMUNITY): Payer: Self-pay

## 2024-08-17 MED ORDER — TEMAZEPAM 30 MG PO CAPS
30.0000 mg | ORAL_CAPSULE | Freq: Every evening | ORAL | 0 refills | Status: DC | PRN
  Filled 2024-08-17: qty 90, 90d supply, fill #0

## 2024-08-18 ENCOUNTER — Other Ambulatory Visit (HOSPITAL_COMMUNITY): Payer: Self-pay

## 2024-09-13 ENCOUNTER — Other Ambulatory Visit (HOSPITAL_COMMUNITY): Payer: Self-pay

## 2024-09-13 MED ORDER — BUPROPION HCL ER (XL) 300 MG PO TB24
300.0000 mg | ORAL_TABLET | Freq: Every day | ORAL | 0 refills | Status: DC
  Filled 2024-09-13: qty 90, 90d supply, fill #0

## 2024-09-20 ENCOUNTER — Other Ambulatory Visit (HOSPITAL_COMMUNITY): Payer: Self-pay

## 2024-09-21 ENCOUNTER — Other Ambulatory Visit (HOSPITAL_COMMUNITY): Payer: Self-pay

## 2024-09-21 ENCOUNTER — Other Ambulatory Visit (HOSPITAL_BASED_OUTPATIENT_CLINIC_OR_DEPARTMENT_OTHER): Payer: Self-pay | Admitting: Family Medicine

## 2024-09-21 DIAGNOSIS — G43009 Migraine without aura, not intractable, without status migrainosus: Secondary | ICD-10-CM | POA: Diagnosis not present

## 2024-09-21 DIAGNOSIS — Z136 Encounter for screening for cardiovascular disorders: Secondary | ICD-10-CM

## 2024-09-21 DIAGNOSIS — Z0000 Encounter for general adult medical examination without abnormal findings: Secondary | ICD-10-CM | POA: Diagnosis not present

## 2024-09-21 DIAGNOSIS — K219 Gastro-esophageal reflux disease without esophagitis: Secondary | ICD-10-CM | POA: Diagnosis not present

## 2024-09-21 DIAGNOSIS — F331 Major depressive disorder, recurrent, moderate: Secondary | ICD-10-CM | POA: Diagnosis not present

## 2024-09-21 DIAGNOSIS — M542 Cervicalgia: Secondary | ICD-10-CM | POA: Diagnosis not present

## 2024-09-21 DIAGNOSIS — E119 Type 2 diabetes mellitus without complications: Secondary | ICD-10-CM | POA: Diagnosis not present

## 2024-09-21 DIAGNOSIS — D509 Iron deficiency anemia, unspecified: Secondary | ICD-10-CM | POA: Diagnosis not present

## 2024-09-21 DIAGNOSIS — F419 Anxiety disorder, unspecified: Secondary | ICD-10-CM | POA: Diagnosis not present

## 2024-09-21 DIAGNOSIS — I10 Essential (primary) hypertension: Secondary | ICD-10-CM | POA: Diagnosis not present

## 2024-09-21 DIAGNOSIS — E7800 Pure hypercholesterolemia, unspecified: Secondary | ICD-10-CM | POA: Diagnosis not present

## 2024-09-21 MED ORDER — PROMETHAZINE HCL 25 MG PO TABS
25.0000 mg | ORAL_TABLET | Freq: Two times a day (BID) | ORAL | 0 refills | Status: DC
  Filled 2024-09-21: qty 60, 30d supply, fill #0

## 2024-09-21 MED ORDER — AMLODIPINE BESYLATE 10 MG PO TABS
10.0000 mg | ORAL_TABLET | Freq: Every day | ORAL | 0 refills | Status: DC
  Filled 2024-09-21: qty 90, 90d supply, fill #0

## 2024-09-25 ENCOUNTER — Other Ambulatory Visit (HOSPITAL_COMMUNITY): Payer: Self-pay

## 2024-09-25 MED ORDER — MOUNJARO 2.5 MG/0.5ML ~~LOC~~ SOAJ
2.5000 mg | SUBCUTANEOUS | 0 refills | Status: DC
  Filled 2024-09-25 – 2024-10-13 (×2): qty 2, 28d supply, fill #0

## 2024-09-26 ENCOUNTER — Other Ambulatory Visit (HOSPITAL_COMMUNITY): Payer: Self-pay

## 2024-09-27 ENCOUNTER — Other Ambulatory Visit (HOSPITAL_BASED_OUTPATIENT_CLINIC_OR_DEPARTMENT_OTHER): Admitting: Radiology

## 2024-10-02 ENCOUNTER — Ambulatory Visit (INDEPENDENT_AMBULATORY_CARE_PROVIDER_SITE_OTHER)
Admission: RE | Admit: 2024-10-02 | Discharge: 2024-10-02 | Disposition: A | Discharge status: Home / Self Care | Payer: Self-pay | Source: Ambulatory Visit | Attending: Family Medicine | Admitting: Family Medicine

## 2024-10-02 DIAGNOSIS — Z136 Encounter for screening for cardiovascular disorders: Secondary | ICD-10-CM

## 2024-10-13 ENCOUNTER — Other Ambulatory Visit (HOSPITAL_COMMUNITY): Payer: Self-pay

## 2024-10-13 MED ORDER — BUTALBITAL-APAP-CAFFEINE 50-325-40 MG PO TABS
1.0000 | ORAL_TABLET | ORAL | 0 refills | Status: AC
  Filled 2024-10-13: qty 180, 30d supply, fill #0

## 2024-10-13 MED ORDER — CLONAZEPAM 1 MG PO TABS
1.0000 mg | ORAL_TABLET | Freq: Two times a day (BID) | ORAL | 0 refills | Status: DC | PRN
  Filled 2024-10-13: qty 60, 30d supply, fill #0

## 2024-10-13 MED ORDER — ESOMEPRAZOLE MAGNESIUM 40 MG PO CPDR
40.0000 mg | DELAYED_RELEASE_CAPSULE | Freq: Every day | ORAL | 0 refills | Status: DC
  Filled 2024-10-13: qty 30, 30d supply, fill #0

## 2024-10-14 ENCOUNTER — Other Ambulatory Visit (HOSPITAL_COMMUNITY): Payer: Self-pay

## 2024-10-16 ENCOUNTER — Other Ambulatory Visit (HOSPITAL_COMMUNITY): Payer: Self-pay

## 2024-10-18 ENCOUNTER — Other Ambulatory Visit (HOSPITAL_COMMUNITY): Payer: Self-pay

## 2024-10-18 MED ORDER — PROMETHAZINE HCL 25 MG PO TABS
25.0000 mg | ORAL_TABLET | Freq: Two times a day (BID) | ORAL | 0 refills | Status: DC | PRN
  Filled 2024-10-18: qty 60, 30d supply, fill #0

## 2024-10-25 ENCOUNTER — Other Ambulatory Visit: Payer: Self-pay

## 2024-11-10 ENCOUNTER — Other Ambulatory Visit (HOSPITAL_COMMUNITY): Payer: Self-pay

## 2024-11-10 ENCOUNTER — Encounter (HOSPITAL_COMMUNITY): Payer: Self-pay

## 2024-11-13 ENCOUNTER — Other Ambulatory Visit (HOSPITAL_COMMUNITY): Payer: Self-pay

## 2024-11-13 ENCOUNTER — Other Ambulatory Visit: Payer: Self-pay

## 2024-11-13 MED ORDER — MOUNJARO 2.5 MG/0.5ML ~~LOC~~ SOAJ
2.5000 mg | SUBCUTANEOUS | 0 refills | Status: DC
  Filled 2024-11-13: qty 2, 28d supply, fill #0

## 2024-11-13 MED ORDER — BUTALBITAL-APAP-CAFFEINE 50-325-40 MG PO TABS
1.0000 | ORAL_TABLET | ORAL | 0 refills | Status: AC | PRN
  Filled 2024-11-13: qty 180, 30d supply, fill #0

## 2024-11-13 MED ORDER — CLONAZEPAM 1 MG PO TABS
1.0000 mg | ORAL_TABLET | Freq: Two times a day (BID) | ORAL | 0 refills | Status: DC | PRN
  Filled 2024-11-13: qty 60, 30d supply, fill #0

## 2024-11-13 MED ORDER — TEMAZEPAM 30 MG PO CAPS
30.0000 mg | ORAL_CAPSULE | Freq: Every evening | ORAL | 0 refills | Status: AC | PRN
  Filled 2024-11-13: qty 90, 90d supply, fill #0

## 2024-11-13 MED ORDER — CLONIDINE HCL 0.2 MG PO TABS
0.2000 mg | ORAL_TABLET | Freq: Two times a day (BID) | ORAL | 1 refills | Status: AC
  Filled 2024-11-13: qty 180, 90d supply, fill #0
  Filled 2024-11-13: qty 173, 87d supply, fill #0
  Filled 2024-11-13: qty 7, 3d supply, fill #0

## 2024-11-13 MED ORDER — IRBESARTAN 300 MG PO TABS
300.0000 mg | ORAL_TABLET | Freq: Every day | ORAL | 1 refills | Status: AC
  Filled 2024-11-13: qty 90, 90d supply, fill #0

## 2024-11-13 MED ORDER — JARDIANCE 25 MG PO TABS
25.0000 mg | ORAL_TABLET | Freq: Every day | ORAL | 1 refills | Status: AC
  Filled 2024-11-13 (×4): qty 90, 90d supply, fill #0

## 2024-11-14 ENCOUNTER — Other Ambulatory Visit (HOSPITAL_COMMUNITY): Payer: Self-pay

## 2024-11-15 ENCOUNTER — Other Ambulatory Visit (HOSPITAL_COMMUNITY): Payer: Self-pay

## 2024-12-05 ENCOUNTER — Other Ambulatory Visit (HOSPITAL_COMMUNITY): Payer: Self-pay

## 2024-12-06 ENCOUNTER — Other Ambulatory Visit (HOSPITAL_COMMUNITY): Payer: Self-pay

## 2024-12-06 MED ORDER — ESOMEPRAZOLE MAGNESIUM 40 MG PO CPDR
40.0000 mg | DELAYED_RELEASE_CAPSULE | Freq: Every day | ORAL | 0 refills | Status: DC
  Filled 2024-12-06: qty 30, 30d supply, fill #0

## 2024-12-08 ENCOUNTER — Other Ambulatory Visit (HOSPITAL_COMMUNITY): Payer: Self-pay

## 2024-12-15 ENCOUNTER — Other Ambulatory Visit (HOSPITAL_COMMUNITY): Payer: Self-pay

## 2024-12-15 ENCOUNTER — Other Ambulatory Visit (HOSPITAL_BASED_OUTPATIENT_CLINIC_OR_DEPARTMENT_OTHER): Payer: Self-pay

## 2024-12-18 ENCOUNTER — Encounter (HOSPITAL_COMMUNITY): Payer: Self-pay | Admitting: Pharmacist

## 2024-12-18 ENCOUNTER — Other Ambulatory Visit (HOSPITAL_COMMUNITY): Payer: Self-pay

## 2024-12-18 MED ORDER — ESCITALOPRAM OXALATE 20 MG PO TABS
20.0000 mg | ORAL_TABLET | Freq: Every day | ORAL | 0 refills | Status: AC
  Filled 2024-12-18 (×2): qty 90, 90d supply, fill #0

## 2024-12-18 MED ORDER — BUPROPION HCL ER (XL) 300 MG PO TB24
300.0000 mg | ORAL_TABLET | Freq: Every day | ORAL | 0 refills | Status: AC
  Filled 2024-12-18 (×2): qty 90, 90d supply, fill #0

## 2024-12-18 MED ORDER — AMLODIPINE BESYLATE 10 MG PO TABS
10.0000 mg | ORAL_TABLET | Freq: Every day | ORAL | 0 refills | Status: AC
  Filled 2024-12-18 (×2): qty 90, 90d supply, fill #0

## 2024-12-18 MED ORDER — CLONAZEPAM 1 MG PO TABS
1.0000 mg | ORAL_TABLET | Freq: Two times a day (BID) | ORAL | 0 refills | Status: AC | PRN
  Filled 2024-12-18: qty 60, 30d supply, fill #0

## 2024-12-19 ENCOUNTER — Other Ambulatory Visit (HOSPITAL_COMMUNITY): Payer: Self-pay

## 2024-12-20 ENCOUNTER — Other Ambulatory Visit (HOSPITAL_COMMUNITY): Payer: Self-pay

## 2024-12-20 MED ORDER — BACLOFEN 10 MG PO TABS
10.0000 mg | ORAL_TABLET | Freq: Three times a day (TID) | ORAL | 1 refills | Status: AC
  Filled 2024-12-20: qty 90, 30d supply, fill #0
  Filled 2025-01-17: qty 90, 30d supply, fill #1

## 2024-12-21 ENCOUNTER — Other Ambulatory Visit (HOSPITAL_COMMUNITY): Payer: Self-pay

## 2024-12-22 ENCOUNTER — Other Ambulatory Visit (HOSPITAL_COMMUNITY): Payer: Self-pay

## 2024-12-22 ENCOUNTER — Other Ambulatory Visit: Payer: Self-pay

## 2024-12-22 MED ORDER — MOUNJARO 2.5 MG/0.5ML ~~LOC~~ SOAJ
2.5000 mg | SUBCUTANEOUS | 0 refills | Status: DC
  Filled 2024-12-22 (×2): qty 2, 28d supply, fill #0

## 2025-01-03 ENCOUNTER — Other Ambulatory Visit (HOSPITAL_COMMUNITY): Payer: Self-pay

## 2025-01-05 ENCOUNTER — Other Ambulatory Visit (HOSPITAL_COMMUNITY): Payer: Self-pay

## 2025-01-05 ENCOUNTER — Other Ambulatory Visit: Payer: Self-pay

## 2025-01-05 MED ORDER — ESTRADIOL 0.075 MG/24HR TD PTTW
1.0000 | MEDICATED_PATCH | TRANSDERMAL | 3 refills | Status: AC
  Filled 2025-01-05: qty 24, 84d supply, fill #0

## 2025-01-08 ENCOUNTER — Other Ambulatory Visit (HOSPITAL_COMMUNITY): Payer: Self-pay

## 2025-01-08 ENCOUNTER — Other Ambulatory Visit: Payer: Self-pay

## 2025-01-08 MED ORDER — ESOMEPRAZOLE MAGNESIUM 40 MG PO CPDR
40.0000 mg | DELAYED_RELEASE_CAPSULE | Freq: Every day | ORAL | 0 refills | Status: DC
  Filled 2025-01-08: qty 30, 30d supply, fill #0

## 2025-01-17 ENCOUNTER — Other Ambulatory Visit (HOSPITAL_COMMUNITY): Payer: Self-pay

## 2025-01-17 ENCOUNTER — Other Ambulatory Visit: Payer: Self-pay

## 2025-01-17 MED ORDER — PROMETHAZINE HCL 25 MG PO TABS
25.0000 mg | ORAL_TABLET | Freq: Two times a day (BID) | ORAL | 0 refills | Status: AC | PRN
  Filled 2025-01-17: qty 60, 30d supply, fill #0

## 2025-01-17 MED ORDER — MOUNJARO 2.5 MG/0.5ML ~~LOC~~ SOAJ
2.5000 mg | SUBCUTANEOUS | 0 refills | Status: AC
  Filled 2025-01-17: qty 2, 28d supply, fill #0

## 2025-01-17 MED ORDER — CLONAZEPAM 1 MG PO TABS
1.0000 mg | ORAL_TABLET | Freq: Two times a day (BID) | ORAL | 0 refills | Status: AC | PRN
  Filled 2025-01-18: qty 60, 30d supply, fill #0

## 2025-01-18 ENCOUNTER — Other Ambulatory Visit: Payer: Self-pay

## 2025-01-18 ENCOUNTER — Other Ambulatory Visit (HOSPITAL_COMMUNITY): Payer: Self-pay

## 2025-01-19 ENCOUNTER — Other Ambulatory Visit (HOSPITAL_COMMUNITY): Payer: Self-pay

## 2025-01-19 ENCOUNTER — Other Ambulatory Visit: Payer: Self-pay

## 2025-01-19 MED ORDER — BACLOFEN 10 MG PO TABS
10.0000 mg | ORAL_TABLET | Freq: Three times a day (TID) | ORAL | 1 refills | Status: AC
  Filled 2025-01-19: qty 270, 90d supply, fill #0

## 2025-01-19 MED ORDER — ESOMEPRAZOLE MAGNESIUM 40 MG PO CPDR
40.0000 mg | DELAYED_RELEASE_CAPSULE | Freq: Every day | ORAL | 3 refills | Status: AC
  Filled 2025-01-19: qty 90, 90d supply, fill #0
